# Patient Record
Sex: Male | Born: 1945
Health system: Southern US, Community
[De-identification: ages and names within clinical notes are randomized; demographics above are authoritative.]

## PROBLEM LIST (undated history)

## (undated) DIAGNOSIS — Z8719 Personal history of other diseases of the digestive system: Secondary | ICD-10-CM

## (undated) DIAGNOSIS — Q078 Other specified congenital malformations of nervous system: Secondary | ICD-10-CM

## (undated) DIAGNOSIS — R51 Headache: Principal | ICD-10-CM

## (undated) DIAGNOSIS — C801 Malignant (primary) neoplasm, unspecified: Secondary | ICD-10-CM

## (undated) DIAGNOSIS — M1990 Unspecified osteoarthritis, unspecified site: Secondary | ICD-10-CM

## (undated) DIAGNOSIS — I4819 Other persistent atrial fibrillation: Secondary | ICD-10-CM

## (undated) DIAGNOSIS — H409 Unspecified glaucoma: Secondary | ICD-10-CM

## (undated) HISTORY — PX: EYE SURGERY: SHX253

## (undated) HISTORY — DX: Malignant (primary) neoplasm, unspecified: C80.1

## (undated) HISTORY — DX: Headache: R51

## (undated) HISTORY — DX: Unspecified glaucoma: H40.9

## (undated) HISTORY — PX: TONSILLECTOMY: SUR1361

## (undated) HISTORY — PX: HERNIA REPAIR: SHX51

## (undated) HISTORY — DX: Personal history of other diseases of the digestive system: Z87.19

## (undated) HISTORY — DX: Other specified congenital malformations of nervous system: Q07.8

---

## 2002-02-03 ENCOUNTER — Ambulatory Visit (HOSPITAL_COMMUNITY): Admission: RE | Admit: 2002-02-03 | Discharge: 2002-02-03 | Payer: Self-pay | Admitting: Gastroenterology

## 2012-06-28 ENCOUNTER — Telehealth: Payer: Self-pay

## 2012-06-28 ENCOUNTER — Ambulatory Visit (INDEPENDENT_AMBULATORY_CARE_PROVIDER_SITE_OTHER): Payer: Medicare Other | Admitting: Family Medicine

## 2012-06-28 VITALS — BP 134/78 | HR 75 | Temp 98.6°F | Resp 16 | Ht 73.18 in | Wt 240.4 lb

## 2012-06-28 DIAGNOSIS — M549 Dorsalgia, unspecified: Secondary | ICD-10-CM

## 2012-06-28 DIAGNOSIS — S39012A Strain of muscle, fascia and tendon of lower back, initial encounter: Secondary | ICD-10-CM

## 2012-06-28 DIAGNOSIS — IMO0002 Reserved for concepts with insufficient information to code with codable children: Secondary | ICD-10-CM

## 2012-06-28 DIAGNOSIS — R3129 Other microscopic hematuria: Secondary | ICD-10-CM

## 2012-06-28 LAB — POCT URINALYSIS DIPSTICK
Bilirubin, UA: NEGATIVE
Glucose, UA: NEGATIVE
Leukocytes, UA: NEGATIVE
Nitrite, UA: NEGATIVE
Protein, UA: NEGATIVE
Spec Grav, UA: >=1.03
Urobilinogen, UA: 0.2
pH, UA: 5

## 2012-06-28 LAB — POCT UA - MICROSCOPIC ONLY
Casts, Ur, LPF, POC: NEGATIVE
Crystals, Ur, HPF, POC: NEGATIVE
Epithelial cells, urine per micros: NEGATIVE
Mucus, UA: NEGATIVE
Yeast, UA: NEGATIVE

## 2012-06-28 MED ORDER — METAXALONE 800 MG PO TABS: 800.0000 mg | ORAL_TABLET | Freq: Three times a day (TID) | ORAL | Status: DC

## 2012-06-28 MED ORDER — NABUMETONE 750 MG PO TABS: 750.0000 mg | ORAL_TABLET | Freq: Two times a day (BID) | ORAL | Status: DC

## 2012-06-28 NOTE — Telephone Encounter (Signed)
Muscle relaxant Dr Alwyn Ren rx'd is not covered and would cost $120.00 and patient would like to know if there is a substitute he can rx.  [id number] - [place], [place] - [address]

## 2012-06-28 NOTE — Telephone Encounter (Signed)
LMOM for pt to CB.  

## 2012-06-28 NOTE — Progress Notes (Signed)
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 He was well hunting and stepped wrong and strain his back. It's been a couple of weeks ago. It's now moved over to the other side. He wonders whether his kidneys related since he has a history of a kidney stone in the past. Has a history of having a stiff back. He says everything is stiff. He has not received any treatment for this. He does not work out, but in hunting season he goes hunting constantly.  Objective: Not on a medicines except medicines for his glaucoma. Alert and oriented. Mild tenderness in the paraspinous muscles bilaterally. No real CVA tenderness however. His abdomen soft without masses tenderness. Straight leg test is negative though he is very high hamstrings. His flexion and extension are poor, as is the lateral flexion. Abdomen soft without mass or tenderness.  Assessment: Mid and low back pain, probably muscular  Plan: Urinalysis  Results for orders placed in visit on 06/28/12  POCT URINALYSIS DIPSTICK      Component Value Range   Color, UA yellow     Clarity, UA clear     Glucose, UA neg     Bilirubin, UA neg     Ketones, UA trace     Spec Grav, UA >=1.030     Blood, UA small     pH, UA 5.0     Protein, UA neg     Urobilinogen, UA 0.2     Nitrite, UA neg     Leukocytes, UA Negative    POCT UA - MICROSCOPIC ONLY      Component Value Range   WBC, Ur, HPF, POC 0-1     RBC, urine, microscopic 6-8     Bacteria, U Microscopic 1+     Mucus, UA neg     Epithelial cells, urine per micros neg     Crystals, Ur, HPF, POC neg     Casts, Ur, LPF, POC neg     Yeast, UA neg     Will repeat urinalysis in 2 weeks. If still has RBCs we will make referral to Dr. Willow Ora

## 2012-06-28 NOTE — Patient Instructions (Addendum)
Take muscle relaxant and anti-inflammatory medication as ordered  Return in 2 weeks for repeat urinalysis. If it remains positive we will need to refer you to a urologist.

## 2012-06-30 NOTE — Telephone Encounter (Signed)
Call in: Robaxin (generic) 750 mg #40 1bid and 2 hs prn muscle relaxant. NR

## 2012-07-01 MED ORDER — METHOCARBAMOL 750 MG PO TABS: 750.0000 mg | ORAL_TABLET | Freq: Two times a day (BID) | ORAL | Status: DC

## 2012-07-01 NOTE — Telephone Encounter (Signed)
Sent in for him,

## 2012-07-11 ENCOUNTER — Other Ambulatory Visit (INDEPENDENT_AMBULATORY_CARE_PROVIDER_SITE_OTHER): Payer: Medicare Other | Admitting: Family Medicine

## 2012-07-11 DIAGNOSIS — R3129 Other microscopic hematuria: Secondary | ICD-10-CM

## 2012-07-11 LAB — POCT UA - MICROSCOPIC ONLY
Bacteria, U Microscopic: NEGATIVE
Casts, Ur, LPF, POC: NEGATIVE
Crystals, Ur, HPF, POC: NEGATIVE
Mucus, UA: NEGATIVE
RBC, urine, microscopic: NEGATIVE
Yeast, UA: NEGATIVE

## 2012-07-11 LAB — POCT URINALYSIS DIPSTICK
Bilirubin, UA: NEGATIVE
Blood, UA: NEGATIVE
Glucose, UA: NEGATIVE
Ketones, UA: NEGATIVE
Leukocytes, UA: NEGATIVE
Nitrite, UA: NEGATIVE
Protein, UA: NEGATIVE
Spec Grav, UA: 1.02
Urobilinogen, UA: 0.2
pH, UA: 5

## 2012-07-11 NOTE — Progress Notes (Signed)
Urinalysis is normal. No blood. No further treatment needed.

## 2012-07-11 NOTE — Progress Notes (Signed)
Patient here for labs only. 

## 2012-08-21 ENCOUNTER — Other Ambulatory Visit: Payer: Self-pay | Admitting: Gastroenterology

## 2012-08-21 DIAGNOSIS — R319 Hematuria, unspecified: Secondary | ICD-10-CM

## 2012-08-28 ENCOUNTER — Ambulatory Visit
Admission: RE | Admit: 2012-08-28 | Discharge: 2012-08-28 | Disposition: A | Discharge status: Home / Self Care | Payer: Medicare Other | Source: Ambulatory Visit | Attending: Gastroenterology | Admitting: Gastroenterology

## 2012-08-28 DIAGNOSIS — R319 Hematuria, unspecified: Secondary | ICD-10-CM

## 2015-01-07 ENCOUNTER — Other Ambulatory Visit: Payer: Self-pay | Admitting: Gastroenterology

## 2015-01-08 ENCOUNTER — Other Ambulatory Visit: Payer: Self-pay | Admitting: Gastroenterology

## 2015-01-14 ENCOUNTER — Encounter (HOSPITAL_COMMUNITY): Payer: Self-pay | Admitting: *Deleted

## 2015-01-21 ENCOUNTER — Encounter (HOSPITAL_COMMUNITY): Payer: Self-pay | Admitting: *Deleted

## 2015-01-21 ENCOUNTER — Ambulatory Visit (HOSPITAL_COMMUNITY): Payer: Medicare Other | Admitting: Anesthesiology

## 2015-01-21 ENCOUNTER — Ambulatory Visit (HOSPITAL_COMMUNITY)
Admission: RE | Admit: 2015-01-21 | Discharge: 2015-01-21 | Disposition: A | Discharge status: Home / Self Care | Payer: Medicare Other | Source: Ambulatory Visit | Attending: Gastroenterology | Admitting: Gastroenterology

## 2015-01-21 ENCOUNTER — Encounter (HOSPITAL_COMMUNITY)
Admission: RE | Disposition: A | Discharge status: Home / Self Care | Payer: Self-pay | Source: Ambulatory Visit | Attending: Gastroenterology

## 2015-01-21 DIAGNOSIS — R12 Heartburn: Secondary | ICD-10-CM | POA: Diagnosis not present

## 2015-01-21 DIAGNOSIS — H409 Unspecified glaucoma: Secondary | ICD-10-CM | POA: Insufficient documentation

## 2015-01-21 DIAGNOSIS — K921 Melena: Secondary | ICD-10-CM | POA: Insufficient documentation

## 2015-01-21 DIAGNOSIS — Z86010 Personal history of colonic polyps: Secondary | ICD-10-CM | POA: Insufficient documentation

## 2015-01-21 DIAGNOSIS — K317 Polyp of stomach and duodenum: Secondary | ICD-10-CM | POA: Diagnosis not present

## 2015-01-21 DIAGNOSIS — K2950 Unspecified chronic gastritis without bleeding: Secondary | ICD-10-CM | POA: Insufficient documentation

## 2015-01-21 HISTORY — PX: COLONOSCOPY: SHX5424

## 2015-01-21 HISTORY — DX: Unspecified osteoarthritis, unspecified site: M19.90

## 2015-01-21 HISTORY — PX: ESOPHAGOGASTRODUODENOSCOPY (EGD) WITH PROPOFOL: SHX5813

## 2015-01-21 SURGERY — ESOPHAGOGASTRODUODENOSCOPY (EGD) WITH PROPOFOL
Anesthesia: Monitor Anesthesia Care

## 2015-01-21 MED ORDER — LIDOCAINE HCL (CARDIAC) 20 MG/ML IV SOLN
INTRAVENOUS | Status: AC
  Filled 2015-01-21: qty 5

## 2015-01-21 MED ORDER — PROPOFOL 500 MG/50ML IV EMUL
INTRAVENOUS | Status: DC | PRN
  Administered 2015-01-21: 50 mg via INTRAVENOUS

## 2015-01-21 MED ORDER — SODIUM CHLORIDE 0.9 % IV SOLN: INTRAVENOUS | Status: DC

## 2015-01-21 MED ORDER — LACTATED RINGERS IV SOLN
INTRAVENOUS | Status: DC
  Administered 2015-01-21: 10:00:00 via INTRAVENOUS
  Administered 2015-01-21: 1000 mL via INTRAVENOUS

## 2015-01-21 MED ORDER — PROPOFOL INFUSION 10 MG/ML OPTIME
INTRAVENOUS | Status: DC | PRN
  Administered 2015-01-21: 100 ug/kg/min via INTRAVENOUS

## 2015-01-21 MED ORDER — PROPOFOL 10 MG/ML IV BOLUS
INTRAVENOUS | Status: AC
  Filled 2015-01-21: qty 20

## 2015-01-21 SURGICAL SUPPLY — 15 items

## 2015-01-21 NOTE — Anesthesia Preprocedure Evaluation (Addendum)
Anesthesia Evaluation  Patient identified by MRN, date of birth, ID band Patient awake  General Assessment Comment:Glaucoma  Reviewed: Allergy & Precautions, NPO status , Patient's Chart, lab work & pertinent test results  Airway Mallampati: II  TM Distance: >3 FB Neck ROM: Full    Dental no notable dental hx.    Pulmonary neg pulmonary ROS,  breath sounds clear to auscultation  Pulmonary exam normal       Cardiovascular negative cardio ROS Normal cardiovascular examRhythm:Regular Rate:Normal     Neuro/Psych negative neurological ROS  negative psych ROS   GI/Hepatic negative GI ROS, Neg liver ROS,   Endo/Other  negative endocrine ROS  Renal/GU negative Renal ROS  negative genitourinary   Musculoskeletal negative musculoskeletal ROS (+)   Abdominal   Peds negative pediatric ROS (+)  Hematology negative hematology ROS (+)   Anesthesia Other Findings   Reproductive/Obstetrics negative OB ROS                             Anesthesia Physical Anesthesia Plan  ASA: II  Anesthesia Plan: MAC   Post-op Pain Management:    Induction: Intravenous  Airway Management Planned: Nasal Cannula  Additional Equipment:   Intra-op Plan:   Post-operative Plan:   Informed Consent: I have reviewed the patients History and Physical, chart, labs and discussed the procedure including the risks, benefits and alternatives for the proposed anesthesia with the patient or authorized representative who has indicated his/her understanding and acceptance.   Dental advisory given  Plan Discussed with: CRNA and Surgeon  Anesthesia Plan Comments:         Anesthesia Quick Evaluation

## 2015-01-21 NOTE — Anesthesia Postprocedure Evaluation (Signed)
  Anesthesia Post-op Note  Patient: Leonard Williams  Procedure(s) Performed: Procedure(s) (LRB): ESOPHAGOGASTRODUODENOSCOPY (EGD) WITH PROPOFOL (N/A) COLONOSCOPY (N/A)  Patient Location: PACU  Anesthesia Type: MAC  Level of Consciousness: awake and alert   Airway and Oxygen Therapy: Patient Spontanous Breathing  Post-op Pain: mild  Post-op Assessment: Post-op Vital signs reviewed, Patient's Cardiovascular Status Stable, Respiratory Function Stable, Patent Airway and No signs of Nausea or vomiting  Last Vitals:  Filed Vitals:   01/21/15 1200  BP: 124/84  Pulse: 54  Temp:   Resp: 13    Post-op Vital Signs: stable   Complications: No apparent anesthesia complications

## 2015-01-21 NOTE — Op Note (Signed)
Problems: Heartburn without dysphagia. Small-volume hematochezia. 12/05/2011 colonoscopy performed with removal of a 4 mm sessile serrated adenomatous cecal polyp.  Endoscopist: Earle Gell  Premedication: Propofol administered by anesthesia  Procedure: Diagnostic esophagogastroduodenoscopy The patient was placed in the left lateral decubitus position. The Pentax gastroscope was passed through the posterior hypopharynx into the proximal esophagus without difficulty. The hypopharynx, larynx, and vocal cords appeared normal.  Esophagoscopy: The proximal, mid, and lower segments of the esophageal mucosa appeared normal. The squamocolumnar junction was regular in appearance and noted at 45 cm from the incisor teeth. There was no endoscopic evidence for the presence of erosive esophagitis or Barrett's esophagus.  Gastroscopy: Retroflex view of the gastric cardia was normal. In the gastric fundus was a 1 cm subepithelial polypoid lesion with normal overlying gastric mucosa. Palpation of the lesion with the closed biopsy forceps revealed a patient which was not suggestive of a lipoma. Multiple biopsies were performed. There were a view scattered benign fundic gland appearing polyps in the gastric body ranging in size from 3 mm-5 mm. Biopsies were performed to confirm my suspicion  That  these polyps were benign fundic gland polyps. The gastric antrum and pylorus appeared normal.  Duodenoscopy: The duodenal bulb and descending duodenum appeared normal.  Assessment: There was a 1 cm subepithelial polypoid lesion in the gastric fundus which was biopsied. I suspect this is a gastrointestinal stromal tumor. Scattered small benign fundic gland polyps were biopsied. Otherwise normal esophagogastroduodenoscopy  Recommendation: Depending on the biopsy findings, the patient may require endoscopic ultrasound of the gastric fundus subepithelial polypoid lesions  Procedure: Surveillance colonoscopy Anal inspection  and digital rectal exam were normal. The Pentax pediatric colonoscope was introduced into the rectum and advanced to the cecum. A normal-appearing appendiceal orifice and ileocecal valve were identified. Colonic preparation for the exam today was good. Withdrawal time was 14 minutes  Rectum. Normal. Retroflexed view of the distal rectum was normal  Sigmoid colon and descending colon. Left colonic diverticulosis  Splenic flexure. Normal  Transverse colon. Normal  Hepatic flexure. Normal  Ascending colon. Normal  Cecum and ileocecal valve. Normal  Assessment: Normal surveillance colonoscopy  Recommendation: Schedule surveillance colonoscopy in 5 years

## 2015-01-21 NOTE — Transfer of Care (Signed)
Immediate Anesthesia Transfer of Care Note  Patient: Leonard Williams  Procedure(s) Performed: Procedure(s): ESOPHAGOGASTRODUODENOSCOPY (EGD) WITH PROPOFOL (N/A) COLONOSCOPY (N/A)  Patient Location: PACU and Endoscopy Unit  Anesthesia Type:MAC  Level of Consciousness: awake, alert , oriented and patient cooperative  Airway & Oxygen Therapy: Patient Spontanous Breathing and Patient connected to nasal cannula oxygen  Post-op Assessment: Report given to RN and Post -op Vital signs reviewed and stable  Post vital signs: Reviewed and stable  Last Vitals:  Filed Vitals:   01/21/15 1021  BP: 141/71  Temp: 36.7 C  Resp: 12    Complications: No apparent anesthesia complications

## 2015-01-21 NOTE — H&P (Signed)
  Problems: Heartburn. Small-volume hematochezia. 12/05/2011 colonoscopy performed with removal of a 4 mm serrated adenomatous cecal polyp.  History: The patient is a 69 year old male born April 15, 1946. He has had intermittent, small-volume hematochezia. In May 2013, he underwent a colonoscopy with removal of a 4 mm sessile serrated adenomatous cecal polyp. He has gained approximately 25 pounds in weight and is experiencing intermittent heartburn without dysphagia or odynophagia.  Scheduled to undergo a colonoscopy and esophagogastroduodenoscopy.  Medication allergies: None  Past medical history: August 2013 normal brain MRI. Glaucoma. Small sessile serrated adenomatous cecal polyp removed in May 2013. Tonsillectomy. Left inguinal herniorrhaphy with mesh.  Exam: The patient is alert and lying comfortably on the endoscopy stretcher. Abdomen is soft and nontender to palpation. Lungs are clear to auscultation. Cardiac exam reveals a regular rhythm.  Plan: Proceed with esophagogastroduodenoscopy and colonoscopy

## 2015-01-22 ENCOUNTER — Encounter (HOSPITAL_COMMUNITY): Payer: Self-pay | Admitting: Gastroenterology

## 2015-01-25 ENCOUNTER — Telehealth: Payer: Self-pay | Admitting: Gastroenterology

## 2015-01-25 DIAGNOSIS — K3189 Other diseases of stomach and duodenum: Secondary | ICD-10-CM

## 2015-01-25 NOTE — Telephone Encounter (Signed)
Leonard Williams, He was referred by Dr. Earle Gell for gastric mass.  He needs upper EUS, radial +/- linear, + MAC, next available EUS Thursday.  Thanks

## 2015-01-26 ENCOUNTER — Other Ambulatory Visit: Payer: Self-pay

## 2015-01-26 DIAGNOSIS — K3189 Other diseases of stomach and duodenum: Secondary | ICD-10-CM

## 2015-01-26 NOTE — Telephone Encounter (Signed)
EUS scheduled, pt instructed and medications reviewed.  Patient instructions mailed to home.  Patient to call with any questions or concerns.  

## 2015-01-26 NOTE — Telephone Encounter (Signed)
Left message on machine to call back  

## 2015-02-01 ENCOUNTER — Encounter (HOSPITAL_COMMUNITY): Payer: Self-pay | Admitting: *Deleted

## 2015-02-04 ENCOUNTER — Ambulatory Visit (HOSPITAL_COMMUNITY): Payer: Medicare Other | Admitting: Anesthesiology

## 2015-02-04 ENCOUNTER — Encounter (HOSPITAL_COMMUNITY): Payer: Self-pay

## 2015-02-04 ENCOUNTER — Ambulatory Visit (HOSPITAL_COMMUNITY)
Admission: RE | Admit: 2015-02-04 | Discharge: 2015-02-04 | Disposition: A | Discharge status: Home / Self Care | Payer: Medicare Other | Source: Ambulatory Visit | Attending: Gastroenterology | Admitting: Gastroenterology

## 2015-02-04 ENCOUNTER — Encounter (HOSPITAL_COMMUNITY)
Admission: RE | Disposition: A | Discharge status: Home / Self Care | Payer: Self-pay | Source: Ambulatory Visit | Attending: Gastroenterology

## 2015-02-04 DIAGNOSIS — M1990 Unspecified osteoarthritis, unspecified site: Secondary | ICD-10-CM | POA: Insufficient documentation

## 2015-02-04 DIAGNOSIS — Z86010 Personal history of colonic polyps: Secondary | ICD-10-CM | POA: Diagnosis not present

## 2015-02-04 DIAGNOSIS — H409 Unspecified glaucoma: Secondary | ICD-10-CM | POA: Insufficient documentation

## 2015-02-04 DIAGNOSIS — K319 Disease of stomach and duodenum, unspecified: Secondary | ICD-10-CM | POA: Diagnosis not present

## 2015-02-04 DIAGNOSIS — K3189 Other diseases of stomach and duodenum: Secondary | ICD-10-CM | POA: Insufficient documentation

## 2015-02-04 HISTORY — PX: EUS: SHX5427

## 2015-02-04 SURGERY — UPPER ENDOSCOPIC ULTRASOUND (EUS) LINEAR
Anesthesia: Monitor Anesthesia Care

## 2015-02-04 MED ORDER — PROPOFOL 10 MG/ML IV BOLUS
INTRAVENOUS | Status: DC | PRN
  Administered 2015-02-04 (×4): 40 mg via INTRAVENOUS

## 2015-02-04 MED ORDER — BUTAMBEN-TETRACAINE-BENZOCAINE 2-2-14 % EX AERO
INHALATION_SPRAY | CUTANEOUS | Status: DC | PRN
  Administered 2015-02-04: 2 via TOPICAL

## 2015-02-04 MED ORDER — KETAMINE HCL 10 MG/ML IJ SOLN
INTRAMUSCULAR | Status: DC | PRN
  Administered 2015-02-04: 20 mg via INTRAVENOUS

## 2015-02-04 MED ORDER — PROPOFOL 10 MG/ML IV BOLUS
INTRAVENOUS | Status: AC
  Filled 2015-02-04: qty 20

## 2015-02-04 MED ORDER — SODIUM CHLORIDE 0.9 % IV SOLN: INTRAVENOUS | Status: DC

## 2015-02-04 MED ORDER — LACTATED RINGERS IV SOLN
INTRAVENOUS | Status: DC | PRN
  Administered 2015-02-04: 10:00:00 via INTRAVENOUS

## 2015-02-04 NOTE — Interval H&P Note (Signed)
History and Physical Interval Note:  02/04/2015 9:48 AM  Leonard Williams  has presented today for surgery, with the diagnosis of gastric mass  The various methods of treatment have been discussed with the patient and family. After consideration of risks, benefits and other options for treatment, the patient has consented to  Procedure(s): UPPER ENDOSCOPIC ULTRASOUND (EUS) LINEAR (N/A) as a surgical intervention .  The patient's history has been reviewed, patient examined, no change in status, stable for surgery.  I have reviewed the patient's chart and labs.  Questions were answered to the patient's satisfaction.     Milus Banister

## 2015-02-04 NOTE — Op Note (Signed)
[facility] [place], [address]   ENDOSCOPIC ULTRASOUND PROCEDURE REPORT  PATIENT: Leonard Williams, Leonard Williams  MR#: [id number] BIRTHDATE: [birth year]  GENDER: male ENDOSCOPIST: Milus Banister, MD REFERRED BY:  Earle Gell, M.D. PROCEDURE DATE:  02/04/2015 PROCEDURE:   Upper EUS ASA CLASS:      Class II INDICATIONS:   1.  incidentally noted subepithelial leions in gastric fundus (Dr.  Wynetta Emery EGD 2 weeks ago).  Mucosal biopsies essentially normal.. MEDICATIONS: Monitored anesthesia care  DESCRIPTION OF PROCEDURE:   After the risks benefits and alternatives of the procedure were  explained, informed consent was obtained. The patient was then placed in the left, lateral, decubitus postion and IV sedation was administered. Throughout the procedure, the patients blood pressure, pulse and oxygen saturations were monitored continuously.  Under direct visualization, the PENTAX EUS SCOPE  endoscope was introduced through the mouth  and advanced to the second portion of the duodenum .  Water was used as necessary to provide an acoustic interface.  Upon completion of the imaging, water was removed and the patient was sent to the recovery room in satisfactory condition.  Endoscopic findings: 1.  The previously noted proximal stomach subepithelial lesion was clearly identified. The overlying mucosa appeared normal. Endoscopically it looks to be about 1 cm across.  EUS findings: 1.  The lesion noted above was hypoechoic, round, discretely bordered, clearly communicated with the muscularis propria layer of the gastric wall. This measured 11.2 mm maximally. 2.  There were no perigastric lymph nodes 3.  The gastric wall was otherwise completely normal 4.  The pancreas, gallbladder, limited views of the left lobe of the liver were all normal.  IMPRESSION/RECOMMENDATION 11.43mm proximal stomach subepithelial lesion.  By EUS this is a GIST lesion and at it's size  (<2cm) there is no need for surgical resection.  Will plan to repeat EUS to check for interval growth in 1 year.   _______________________________ eSigned:  Milus Banister, MD 02/04/2015 10:22 AM

## 2015-02-04 NOTE — Transfer of Care (Signed)
Immediate Anesthesia Transfer of Care Note  Patient: Leonard Williams  Procedure(s) Performed: Procedure(s): UPPER ENDOSCOPIC ULTRASOUND (EUS) LINEAR (N/A)  Patient Location: PACU and Endoscopy Unit  Anesthesia Type:MAC  Level of Consciousness: awake, oriented, patient cooperative, lethargic and responds to stimulation  Airway & Oxygen Therapy: Patient Spontanous Breathing and Patient connected to nasal cannula oxygen  Post-op Assessment: Report given to RN, Post -op Vital signs reviewed and stable and Patient moving all extremities  Post vital signs: Reviewed and stable  Last Vitals:  Filed Vitals:   02/04/15 0920  BP: 146/70  Pulse: 63  Temp: 36.6 C  Resp: 10    Complications: No apparent anesthesia complications

## 2015-02-04 NOTE — Anesthesia Postprocedure Evaluation (Signed)
  Anesthesia Post-op Note  Patient: Leonard Williams  Procedure(s) Performed: Procedure(s) (LRB): UPPER ENDOSCOPIC ULTRASOUND (EUS) LINEAR (N/A)  Patient Location: PACU  Anesthesia Type: MAC  Level of Consciousness: awake and alert   Airway and Oxygen Therapy: Patient Spontanous Breathing  Post-op Pain: mild  Post-op Assessment: Post-op Vital signs reviewed, Patient's Cardiovascular Status Stable, Respiratory Function Stable, Patent Airway and No signs of Nausea or vomiting  Last Vitals:  Filed Vitals:   02/04/15 1016  BP: 118/74  Pulse:   Temp: 36.7 C  Resp: 11    Post-op Vital Signs: stable   Complications: No apparent anesthesia complications

## 2015-02-04 NOTE — Anesthesia Preprocedure Evaluation (Signed)
Anesthesia Evaluation  Patient identified by MRN, date of birth, ID band Patient awake    Reviewed: Allergy & Precautions, NPO status , Patient's Chart, lab work & pertinent test results  Airway Mallampati: II  TM Distance: >3 FB Neck ROM: Full    Dental no notable dental hx.    Pulmonary neg pulmonary ROS,  breath sounds clear to auscultation  Pulmonary exam normal       Cardiovascular negative cardio ROS Normal cardiovascular examRhythm:Regular Rate:Normal     Neuro/Psych negative neurological ROS  negative psych ROS   GI/Hepatic negative GI ROS, Neg liver ROS,   Endo/Other  negative endocrine ROS  Renal/GU negative Renal ROS  negative genitourinary   Musculoskeletal  (+) Arthritis -,   Abdominal   Peds negative pediatric ROS (+)  Hematology negative hematology ROS (+)   Anesthesia Other Findings   Reproductive/Obstetrics negative OB ROS                             Anesthesia Physical Anesthesia Plan  ASA: II  Anesthesia Plan: MAC   Post-op Pain Management:    Induction: Intravenous  Airway Management Planned: Natural Airway  Additional Equipment:   Intra-op Plan:   Post-operative Plan:   Informed Consent: I have reviewed the patients History and Physical, chart, labs and discussed the procedure including the risks, benefits and alternatives for the proposed anesthesia with the patient or authorized representative who has indicated his/her understanding and acceptance.   Dental advisory given  Plan Discussed with: CRNA  Anesthesia Plan Comments:         Anesthesia Quick Evaluation

## 2015-02-04 NOTE — Discharge Instructions (Signed)
Conscious Sedation, Adult, Care After °Refer to this sheet in the next few weeks. These instructions provide you with information on caring for yourself after your procedure. Your health care provider may also give you more specific instructions. Your treatment has been planned according to current medical practices, but problems sometimes occur. Call your health care provider if you have any problems or questions after your procedure. °WHAT TO EXPECT AFTER THE PROCEDURE  °After your procedure: °· You may feel sleepy, clumsy, and have poor balance for several hours. °· Vomiting may occur if you eat too soon after the procedure. °HOME CARE INSTRUCTIONS °· Do not participate in any activities where you could become injured for at least 24 hours. Do not: °¨ Drive. °¨ Swim. °¨ Ride a bicycle. °¨ Operate heavy machinery. °¨ Cook. °¨ Use power tools. °¨ Climb ladders. °¨ Work from a high place. °· Do not make important decisions or sign legal documents until you are improved. °· If you vomit, drink water, juice, or soup when you can drink without vomiting. Make sure you have little or no nausea before eating solid foods. °· Only take over-the-counter or prescription medicines for pain, discomfort, or fever as directed by your health care provider. °· Make sure you and your family fully understand everything about the medicines given to you, including what side effects may occur. °· You should not drink alcohol, take sleeping pills, or take medicines that cause drowsiness for at least 24 hours. °· If you smoke, do not smoke without supervision. °· If you are feeling better, you may resume normal activities 24 hours after you were sedated. °· Keep all appointments with your health care provider. °SEEK MEDICAL CARE IF: °· Your skin is pale or bluish in color. °· You continue to feel nauseous or vomit. °· Your pain is getting worse and is not helped by medicine. °· You have bleeding or swelling. °· You are still sleepy or  feeling clumsy after 24 hours. °SEEK IMMEDIATE MEDICAL CARE IF: °· You develop a rash. °· You have difficulty breathing. °· You develop any type of allergic problem. °· You have a fever. °MAKE SURE YOU: °· Understand these instructions. °· Will watch your condition. °· Will get help right away if you are not doing well or get worse. °Document Released: 04/16/2013 Document Reviewed: 04/16/2013 °[employer]® Patient Information ©2015 [employer]. This information is not intended to replace advice given to you by your health care provider. Make sure you discuss any questions you have with your health care provider. °  °

## 2015-02-04 NOTE — H&P (View-Only) (Signed)
  Problems: Heartburn. Small-volume hematochezia. 12/05/2011 colonoscopy performed with removal of a 4 mm serrated adenomatous cecal polyp.  History: The patient is a 69 year old male born 10-19-1945. He has had intermittent, small-volume hematochezia. In May 2013, he underwent a colonoscopy with removal of a 4 mm sessile serrated adenomatous cecal polyp. He has gained approximately 25 pounds in weight and is experiencing intermittent heartburn without dysphagia or odynophagia.  Scheduled to undergo a colonoscopy and esophagogastroduodenoscopy.  Medication allergies: None  Past medical history: August 2013 normal brain MRI. Glaucoma. Small sessile serrated adenomatous cecal polyp removed in May 2013. Tonsillectomy. Left inguinal herniorrhaphy with mesh.  Exam: The patient is alert and lying comfortably on the endoscopy stretcher. Abdomen is soft and nontender to palpation. Lungs are clear to auscultation. Cardiac exam reveals a regular rhythm.  Plan: Proceed with esophagogastroduodenoscopy and colonoscopy

## 2015-02-05 ENCOUNTER — Encounter (HOSPITAL_COMMUNITY): Payer: Self-pay | Admitting: Gastroenterology

## 2015-03-01 ENCOUNTER — Encounter: Payer: Self-pay | Admitting: Neurology

## 2015-03-04 NOTE — Telephone Encounter (Signed)
I spoke to [facility] at [facility]. She faxed the report of the patient's MRI from 2013. Report placed on Dr. Jannifer Franklin' desk.

## 2015-03-08 ENCOUNTER — Encounter: Payer: Self-pay | Admitting: Neurology

## 2015-03-08 ENCOUNTER — Ambulatory Visit (INDEPENDENT_AMBULATORY_CARE_PROVIDER_SITE_OTHER): Payer: Medicare Other | Admitting: Neurology

## 2015-03-08 VITALS — BP 131/80 | HR 65 | Ht 74.0 in | Wt 244.5 lb

## 2015-03-08 DIAGNOSIS — R51 Headache: Secondary | ICD-10-CM | POA: Diagnosis not present

## 2015-03-08 DIAGNOSIS — R519 Headache, unspecified: Secondary | ICD-10-CM

## 2015-03-08 HISTORY — DX: Headache, unspecified: R51.9

## 2015-03-08 MED ORDER — TOPIRAMATE 25 MG PO TABS: ORAL_TABLET | ORAL | Status: DC

## 2015-03-08 NOTE — Patient Instructions (Addendum)
We will check MRI of the brain and get blood work. We will start Topamax for the headache.  Topamax (topiramate) is a seizure medication that has an FDA approval for seizures and for migraine headache. Potential side effects of this medication include weight loss, cognitive slowing, tingling in the fingers and toes, and carbonated drinks will taste bad. If any significant side effects are noted on this drug, please contact our office.  Headaches, Frequently Asked Questions MIGRAINE HEADACHES Q: What is migraine? What causes it? How can I treat it? A: Generally, migraine headaches begin as a dull ache. Then they develop into a constant, throbbing, and pulsating pain. You may experience pain at the temples. You may experience pain at the front or back of one or both sides of the head. The pain is usually accompanied by a combination of:  Nausea.  Vomiting.  Sensitivity to light and noise. Some people (about 15%) experience an aura (see below) before an attack. The cause of migraine is believed to be chemical reactions in the brain. Treatment for migraine may include over-the-counter or prescription medications. It may also include self-help techniques. These include relaxation training and biofeedback.  Q: What is an aura? A: About 15% of people with migraine get an "aura". This is a sign of neurological symptoms that occur before a migraine headache. You may see wavy or jagged lines, dots, or flashing lights. You might experience tunnel vision or blind spots in one or both eyes. The aura can include visual or auditory hallucinations (something imagined). It may include disruptions in smell (such as strange odors), taste or touch. Other symptoms include:  Numbness.  A "pins and needles" sensation.  Difficulty in recalling or speaking the correct word. These neurological events may last as long as 60 minutes. These symptoms will fade as the headache begins. Q: What is a trigger? A: Certain  physical or environmental factors can lead to or "trigger" a migraine. These include:  Foods.  Hormonal changes.  Weather.  Stress. It is important to remember that triggers are different for everyone. To help prevent migraine attacks, you need to figure out which triggers affect you. Keep a headache diary. This is a good way to track triggers. The diary will help you talk to your healthcare professional about your condition. Q: Does weather affect migraines? A: Bright sunshine, hot, humid conditions, and drastic changes in barometric pressure may lead to, or "trigger," a migraine attack in some people. But studies have shown that weather does not act as a trigger for everyone with migraines. Q: What is the link between migraine and hormones? A: Hormones start and regulate many of your body's functions. Hormones keep your body in balance within a constantly changing environment. The levels of hormones in your body are unbalanced at times. Examples are during menstruation, pregnancy, or menopause. That can lead to a migraine attack. In fact, about three quarters of all women with migraine report that their attacks are related to the menstrual cycle.  Q: Is there an increased risk of stroke for migraine sufferers? A: The likelihood of a migraine attack causing a stroke is very remote. That is not to say that migraine sufferers cannot have a stroke associated with their migraines. In persons under age 54, the most common associated factor for stroke is migraine headache. But over the course of a person's normal life span, the occurrence of migraine headache may actually be associated with a reduced risk of dying from cerebrovascular disease due to  stroke.  Q: What are acute medications for migraine? A: Acute medications are used to treat the pain of the headache after it has started. Examples over-the-counter medications, NSAIDs, ergots, and triptans.  Q: What are the triptans? A: Triptans are the  newest class of abortive medications. They are specifically targeted to treat migraine. Triptans are vasoconstrictors. They moderate some chemical reactions in the brain. The triptans work on receptors in your brain. Triptans help to restore the balance of a neurotransmitter called serotonin. Fluctuations in levels of serotonin are thought to be a main cause of migraine.  Q: Are over-the-counter medications for migraine effective? A: Over-the-counter, or "OTC," medications may be effective in relieving mild to moderate pain and associated symptoms of migraine. But you should see your caregiver before beginning any treatment regimen for migraine.  Q: What are preventive medications for migraine? A: Preventive medications for migraine are sometimes referred to as "prophylactic" treatments. They are used to reduce the frequency, severity, and length of migraine attacks. Examples of preventive medications include antiepileptic medications, antidepressants, beta-blockers, calcium channel blockers, and NSAIDs (nonsteroidal anti-inflammatory drugs). Q: Why are anticonvulsants used to treat migraine? A: During the past few years, there has been an increased interest in antiepileptic drugs for the prevention of migraine. They are sometimes referred to as "anticonvulsants". Both epilepsy and migraine may be caused by similar reactions in the brain.  Q: Why are antidepressants used to treat migraine? A: Antidepressants are typically used to treat people with depression. They may reduce migraine frequency by regulating chemical levels, such as serotonin, in the brain.  Q: What alternative therapies are used to treat migraine? A: The term "alternative therapies" is often used to describe treatments considered outside the scope of conventional Western medicine. Examples of alternative therapy include acupuncture, acupressure, and yoga. Another common alternative treatment is herbal therapy. Some herbs are believed to  relieve headache pain. Always discuss alternative therapies with your caregiver before proceeding. Some herbal products contain arsenic and other toxins. TENSION HEADACHES Q: What is a tension-type headache? What causes it? How can I treat it? A: Tension-type headaches occur randomly. They are often the result of temporary stress, anxiety, fatigue, or anger. Symptoms include soreness in your temples, a tightening band-like sensation around your head (a "vice-like" ache). Symptoms can also include a pulling feeling, pressure sensations, and contracting head and neck muscles. The headache begins in your forehead, temples, or the back of your head and neck. Treatment for tension-type headache may include over-the-counter or prescription medications. Treatment may also include self-help techniques such as relaxation training and biofeedback. CLUSTER HEADACHES Q: What is a cluster headache? What causes it? How can I treat it? A: Cluster headache gets its name because the attacks come in groups. The pain arrives with little, if any, warning. It is usually on one side of the head. A tearing or bloodshot eye and a runny nose on the same side of the headache may also accompany the pain. Cluster headaches are believed to be caused by chemical reactions in the brain. They have been described as the most severe and intense of any headache type. Treatment for cluster headache includes prescription medication and oxygen. SINUS HEADACHES Q: What is a sinus headache? What causes it? How can I treat it? A: When a cavity in the bones of the face and skull (a sinus) becomes inflamed, the inflammation will cause localized pain. This condition is usually the result of an allergic reaction, a tumor, or an infection. If your  headache is caused by a sinus blockage, such as an infection, you will probably have a fever. An x-ray will confirm a sinus blockage. Your caregiver's treatment might include antibiotics for the infection, as  well as antihistamines or decongestants.  REBOUND HEADACHES Q: What is a rebound headache? What causes it? How can I treat it? A: A pattern of taking acute headache medications too often can lead to a condition known as "rebound headache." A pattern of taking too much headache medication includes taking it more than 2 days per week or in excessive amounts. That means more than the label or a caregiver advises. With rebound headaches, your medications not only stop relieving pain, they actually begin to cause headaches. Doctors treat rebound headache by tapering the medication that is being overused. Sometimes your caregiver will gradually substitute a different type of treatment or medication. Stopping may be a challenge. Regularly overusing a medication increases the potential for serious side effects. Consult a caregiver if you regularly use headache medications more than 2 days per week or more than the label advises. ADDITIONAL QUESTIONS AND ANSWERS Q: What is biofeedback? A: Biofeedback is a self-help treatment. Biofeedback uses special equipment to monitor your body's involuntary physical responses. Biofeedback monitors:  Breathing.  Pulse.  Heart rate.  Temperature.  Muscle tension.  Brain activity. Biofeedback helps you refine and perfect your relaxation exercises. You learn to control the physical responses that are related to stress. Once the technique has been mastered, you do not need the equipment any more. Q: Are headaches hereditary? A: Four out of five (80%) of people that suffer report a family history of migraine. Scientists are not sure if this is genetic or a family predisposition. Despite the uncertainty, a child has a 50% chance of having migraine if one parent suffers. The child has a 75% chance if both parents suffer.  Q: Can children get headaches? A: By the time they reach high school, most young people have experienced some type of headache. Many safe and effective  approaches or medications can prevent a headache from occurring or stop it after it has begun.  Q: What type of doctor should I see to diagnose and treat my headache? A: Start with your primary caregiver. Discuss his or her experience and approach to headaches. Discuss methods of classification, diagnosis, and treatment. Your caregiver may decide to recommend you to a headache specialist, depending upon your symptoms or other physical conditions. Having diabetes, allergies, etc., may require a more comprehensive and inclusive approach to your headache. The National Headache Foundation will provide, upon request, a list of [facility] physician members in your state. Document Released: 09/16/2003 Document Revised: 09/18/2011 Document Reviewed: 02/24/2008 [facility] Patient Information 2015 [place], [place]. This information is not intended to replace advice given to you by your health care provider. Make sure you discuss any questions you have with your health care provider.

## 2015-03-08 NOTE — Progress Notes (Signed)
Reason for visit: Headache  Referring physician: Dr. Earle Gell  Leonard Williams is a 69 y.o. male  History of present illness:  Leonard Williams is a 69 year old right-handed white male with a history of headaches that became daily about 10 weeks prior to this evaluation. The patient was having occasional headaches prior to this, on average once a week. The headaches may be associated with visual scotoma and flashing lights. The headaches 10 weeks ago began with these visual phenomenon, but this has not recurred. The headache has been daily in nature, primarily bifrontal, with some discomfort in the neck is well. The patient denies any fevers or chills, he does report some exposure to ticks over the summer. He has been taking Aleve and ibuprofen which usually helps his headaches, but this is currently ineffective. He denies any nausea or vomiting. He denies any numbness or weakness of extremities. He does have a history of significant glaucoma, he has significant visual loss in the left eye associated with this. He is followed by Dr. Katy Fitch for this. The patient indicates that the severity of the headache may vary from one day to the next. He has had caffeine withdrawal headaches in the past, but not currently. He has been under a lot of stress recently. He is sent to this office for an evaluation.  Past Medical History  Diagnosis Date  . Glaucoma   . Arthritis     mild arthritis- neck shoulders  . History of colonic diverticulitis     left  . Glaucoma   . Glaucoma   . Beverely Low Gunn pupil   . Cancer     GI tumor  . Headache disorder 03/08/2015    Past Surgical History  Procedure Laterality Date  . Eye surgery      laser surgery for glaucoma  . Tonsillectomy    . Hernia repair Left     groin  . Esophagogastroduodenoscopy (egd) with propofol N/A 01/21/2015    Procedure: ESOPHAGOGASTRODUODENOSCOPY (EGD) WITH PROPOFOL;  Surgeon: Garlan Fair, MD;  Location: [facility];  Service:  Endoscopy;  Laterality: N/A;  . Colonoscopy N/A 01/21/2015    Procedure: COLONOSCOPY;  Surgeon: Garlan Fair, MD;  Location: [facility];  Service: Endoscopy;  Laterality: N/A;  . Eus N/A 02/04/2015    Procedure: UPPER ENDOSCOPIC ULTRASOUND (EUS) LINEAR;  Surgeon: Milus Banister, MD;  Location: [facility];  Service: Endoscopy;  Laterality: N/A;    Family History  Problem Relation Age of Onset  . Glaucoma Mother   . Dementia Father   . Graves' disease Sister   . Heart disease Sister   . Migraines Sister   . Heart attack Brother   . CAD Brother     Social history:  reports that he has never smoked. He has never used smokeless tobacco. He reports that he drinks about 0.6 - 1.2 oz of alcohol per week. He reports that he does not use illicit drugs.  Medications:  Prior to Admission medications   Medication Sig Start Date End Date Taking? Authorizing Provider  DORZOLAMIDE HCL-TIMOLOL MAL OP Place 1 drop into both eyes 2 (two) times daily.   Yes Historical Provider, MD  latanoprost (XALATAN) 0.005 % ophthalmic solution Place 1 drop into both eyes at bedtime.    Yes Historical Provider, MD  Multiple Vitamin (MULTIVITAMIN WITH MINERALS) TABS tablet Take 1 tablet by mouth every evening.    Yes Historical Provider, MD  Multiple Vitamins-Minerals (CENTRUM SILVER PO) Take 1 tablet  by mouth daily.   Yes Historical Provider, MD  Naproxen Sodium (ALEVE PO) Take by mouth as needed.   Yes Historical Provider, MD     No Known Allergies  ROS:  Out of a complete 14 system review of symptoms, the patient complains only of the following symptoms, and all other reviewed systems are negative.  Ringing in the ears Eye pain Joint discomfort Headache  Blood pressure 131/80, pulse 65, height 6\' 2"  (1.88 m), weight 244 lb 8 oz (110.904 kg).  Physical Exam  General: The patient is alert and cooperative at the time of the examination.  Eyes: Pupils are equal, round, and reactive to light. Discs  are flat bilaterally.  Neck: The neck is supple, no carotid bruits are noted.  Respiratory: The respiratory examination is clear.  Cardiovascular: The cardiovascular examination reveals a regular rate and rhythm, no obvious murmurs or rubs are noted.   Neuromuscular: Range of movement of the cervical spine is relatively full. No crepitus is noted in the temporal need to joints.  Skin: Extremities are without significant edema.  Neurologic Exam  Mental status: The patient is alert and oriented x 3 at the time of the examination. The patient has apparent normal recent and remote memory, with an apparently normal attention span and concentration ability.  Cranial nerves: Facial symmetry is present. There is good sensation of the face to pinprick and soft touch bilaterally. The strength of the facial muscles and the muscles to head turning and shoulder shrug are normal bilaterally. Speech is well enunciated, no aphasia or dysarthria is noted. Extraocular movements are full. Visual fields are full. The tongue is midline, and the patient has symmetric elevation of the soft palate. No obvious hearing deficits are noted.  Motor: The motor testing reveals 5 over 5 strength of all 4 extremities. Good symmetric motor tone is noted throughout.  Sensory: Sensory testing is intact to pinprick, soft touch, vibration sensation, and position sense on all 4 extremities. No evidence of extinction is noted.  Coordination: Cerebellar testing reveals good finger-nose-finger and heel-to-shin bilaterally.  Gait and station: Gait is normal. Tandem gait is normal. Romberg is negative. No drift is seen.  Reflexes: Deep tendon reflexes are symmetric and normal bilaterally. Toes are downgoing bilaterally.   Assessment/Plan:  1. Chronic daily headache  The patient has started having daily headache, the headache started with some visual scotoma and scintillating scotoma consistent with migraine headache. The  patient will be started on Topamax. His sister has migraine. The patient will be set up for MRI evaluation of the brain, the last MRI was done 3 years ago. He will be set up for blood work today to include a sedimentation rate and a Lyme antibody panel. He will follow-up in 2 or 3 months.  Jill Alexanders MD 03/08/2015 7:18 PM  [facility] [address] [place], [place] [address]  Phone [phone] Fax [phone]

## 2015-03-09 LAB — SEDIMENTATION RATE: Sed Rate: 2 mm/hr (ref 0–30)

## 2015-03-09 LAB — B. BURGDORFI ANTIBODIES: Lyme IgG/IgM Ab: <0.91 {ISR} (ref 0.00–0.90)

## 2015-03-12 ENCOUNTER — Ambulatory Visit
Admission: RE | Admit: 2015-03-12 | Discharge: 2015-03-12 | Disposition: A | Discharge status: Home / Self Care | Payer: Medicare Other | Source: Ambulatory Visit | Attending: Neurology | Admitting: Neurology

## 2015-03-12 DIAGNOSIS — R51 Headache: Principal | ICD-10-CM

## 2015-03-12 DIAGNOSIS — R519 Headache, unspecified: Secondary | ICD-10-CM

## 2015-03-15 ENCOUNTER — Telehealth: Payer: Self-pay | Admitting: Neurology

## 2015-03-15 NOTE — Telephone Encounter (Signed)
I called patient. MRI brain is relatively unremarkable, very minimal white matter changes, may be related to the history of migraine.   MRI brain 03/12/2015:  IMPRESSION: This is an abnormal MRI of the brain with and without contrast showed the following: 1. Small chronic microhemorrhage with hemosiderin deposition adjacent to the right lateral ventricle in the parietal lobe, best seen on susceptibility weighted images. 2. Mild extent of T2/FLAIR hyperintense foci in the subcortical and deep white matter of the hemispheres consistent with mild chronic age-related microvascular ischemic changes.

## 2015-04-14 ENCOUNTER — Telehealth: Payer: Self-pay | Admitting: Neurology

## 2015-04-14 NOTE — Telephone Encounter (Signed)
I have received a disc of the MRI the brain was done on 02/14/2012. I compared to the most recent MRI the brain was done. There does not appear to be any significant change in the minimal white matter changes noted.

## 2015-06-08 ENCOUNTER — Ambulatory Visit (INDEPENDENT_AMBULATORY_CARE_PROVIDER_SITE_OTHER): Payer: Medicare Other | Admitting: Neurology

## 2015-06-08 ENCOUNTER — Encounter: Payer: Self-pay | Admitting: Neurology

## 2015-06-08 VITALS — BP 123/74 | HR 75 | Ht 74.0 in | Wt 237.0 lb

## 2015-06-08 DIAGNOSIS — R519 Headache, unspecified: Secondary | ICD-10-CM

## 2015-06-08 DIAGNOSIS — R51 Headache: Secondary | ICD-10-CM

## 2015-06-08 NOTE — Progress Notes (Signed)
Reason for visit: Headache   Leonard Williams is an 69 y.o. male  History of present illness:  Leonard Williams is a 69 year old right-handed white male with a history of daily headaches. The patient has had migraine-type headaches previously, some headaches associated with visual aura, he was having on average one headache a week prior to his initial evaluation here. The patient has had headaches that have converted to daily in nature, mainly around the eyes, bifrontal and temporal. The headaches have reduced in severity by about 50%, but are still daily. He is on Topamax to 75 mg at night, tolerating the medication well. The headaches did not keep him from doing anything at this time. MRI of the brain showed a small micro-hemorrhage in the right parietal area, and a mucous retention cyst in the left maxillary sinus. The MRI of the brain showed minimal white matter changes. He returns to this office for an evaluation. He denies any nausea, vomiting, or concentration impairment. He will be getting new glasses in the near future.  Past Medical History  Diagnosis Date  . Glaucoma   . Arthritis     mild arthritis- neck shoulders  . History of colonic diverticulitis     left  . Glaucoma   . Glaucoma   . Beverely Low Gunn pupil ([place])   . Cancer [facility])     GI tumor  . Headache disorder 03/08/2015    Past Surgical History  Procedure Laterality Date  . Eye surgery      laser surgery for glaucoma  . Tonsillectomy    . Hernia repair Left     groin  . Esophagogastroduodenoscopy (egd) with propofol N/A 01/21/2015    Procedure: ESOPHAGOGASTRODUODENOSCOPY (EGD) WITH PROPOFOL;  Surgeon: Garlan Fair, MD;  Location: [facility];  Service: Endoscopy;  Laterality: N/A;  . Colonoscopy N/A 01/21/2015    Procedure: COLONOSCOPY;  Surgeon: Garlan Fair, MD;  Location: [facility];  Service: Endoscopy;  Laterality: N/A;  . Eus N/A 02/04/2015    Procedure: UPPER ENDOSCOPIC ULTRASOUND (EUS) LINEAR;  Surgeon:  Milus Banister, MD;  Location: [facility];  Service: Endoscopy;  Laterality: N/A;    Family History  Problem Relation Age of Onset  . Glaucoma Mother   . Dementia Father   . Graves' disease Sister   . Heart disease Sister   . Migraines Sister   . Heart attack Brother   . CAD Brother     Social history:  reports that he has never smoked. He has never used smokeless tobacco. He reports that he drinks about 0.6 - 1.2 oz of alcohol per week. He reports that he does not use illicit drugs.   No Known Allergies  Medications:  Prior to Admission medications   Medication Sig Start Date End Date Taking? Authorizing Provider  DORZOLAMIDE HCL-TIMOLOL MAL OP Place 1 drop into both eyes 2 (two) times daily.   Yes Historical Provider, MD  latanoprost (XALATAN) 0.005 % ophthalmic solution Place 1 drop into both eyes at bedtime.    Yes Historical Provider, MD  Multiple Vitamin (MULTIVITAMIN WITH MINERALS) TABS tablet Take 1 tablet by mouth every evening.    Yes Historical Provider, MD  Multiple Vitamins-Minerals (CENTRUM SILVER PO) Take 1 tablet by mouth daily.   Yes Historical Provider, MD  Naproxen Sodium (ALEVE PO) Take by mouth as needed.   Yes Historical Provider, MD    ROS:  Out of a complete 14 system review of symptoms, the patient complains only  of the following symptoms, and all other reviewed systems are negative.  Ringing in the ears, runny nose Cough Borderline sleep apnea Headache  Blood pressure 123/74, pulse 75, height 6\' 2"  (1.88 m), weight 237 lb (107.502 kg).  Physical Exam  General: The patient is alert and cooperative at the time of the examination.  Skin: No significant peripheral edema is noted.   Neurologic Exam  Mental status: The patient is alert and oriented x 3 at the time of the examination. The patient has apparent normal recent and remote memory, with an apparently normal attention span and concentration ability.   Cranial nerves: Facial symmetry is  present. Speech is normal, no aphasia or dysarthria is noted. Extraocular movements are full. Visual fields are full.  Motor: The patient has good strength in all 4 extremities.  Sensory examination: Soft touch sensation is symmetric on the face, arms, and legs.  Coordination: The patient has good finger-nose-finger and heel-to-shin bilaterally.  Gait and station: The patient has a normal gait. Tandem gait is normal. Romberg is negative. No drift is seen.  Reflexes: Deep tendon reflexes are symmetric.   MRI brain 03/13/15:  IMPRESSION: This is an abnormal MRI of the brain with and without contrast showed the following: 1. Small chronic microhemorrhage with hemosiderin deposition adjacent to the right lateral ventricle in the parietal lobe, best seen on susceptibility weighted images. 2. Mild extent of T2/FLAIR hyperintense foci in the subcortical and deep white matter of the hemispheres consistent with mild chronic age-related microvascular ischemic changes.   * MRI scan images were reviewed online. I agree with the written report.    Assessment/Plan:  One. Chronic daily headache  The patient has had some benefit with the severity of the headache with the Topamax, but the headaches remain daily. We will push the dose up gradually to 150 mg daily. If this does not offer benefit, the patient will contact our office, we will consider switching to another medication such as nortriptyline. He will follow up otherwise in 4 months.  Leonard Alexanders MD 06/08/2015 8:04 PM  [facility] [address] [place], [place] [address]  Phone [phone] Fax [phone]

## 2015-06-15 ENCOUNTER — Telehealth: Payer: Self-pay | Admitting: Neurology

## 2015-06-15 MED ORDER — TOPIRAMATE 100 MG PO TABS: 100.0000 mg | ORAL_TABLET | Freq: Every day | ORAL | Status: DC

## 2015-06-15 MED ORDER — TOPIRAMATE 50 MG PO TABS: 50.0000 mg | ORAL_TABLET | Freq: Every day | ORAL | Status: DC

## 2015-06-15 NOTE — Telephone Encounter (Signed)
Patient called back, Pharmacist at [facility] recommended instead of 2 Rx's 1 for 100 mg, 1 for 50 mg to change Rx to 1 Rx for 3- 50 mg tablets, that way patient won't have to cut in half and insurance will pay for it.

## 2015-06-15 NOTE — Telephone Encounter (Signed)
Patient called to request Topiramate increase from 75 mg to 150 mg, [employer].

## 2015-06-15 NOTE — Telephone Encounter (Signed)
I have spoken with Leonard Williams.  He is currently taking Topamax 125mg  daily, still having h/a's, would like to increase to 150mg  daily as discussed at last ov. Rx's pended for Dr. Jannifer Franklin to release/fim

## 2015-06-15 NOTE — Telephone Encounter (Signed)
LMOM (identified vm)that he shouldn't have to cut any tablets in half--Rx. for 100mg  and 50mg  tabs sent in, so he will take one of each.  May call if he has other questions/fim

## 2015-06-17 MED ORDER — TOPIRAMATE 50 MG PO TABS: 150.0000 mg | ORAL_TABLET | Freq: Every day | ORAL | Status: DC

## 2015-06-17 NOTE — Addendum Note (Signed)
Addended by: Margette Fast on: 06/17/2015 05:36 PM   Modules accepted: Orders, Medications

## 2015-06-17 NOTE — Telephone Encounter (Signed)
I called the patient's wife. She insists that the patient take only one Rx for Topamax. They wish to take 3-50 mg tablets as opposed to 1-100 mg and 1-50 mg tablet.

## 2015-06-17 NOTE — Telephone Encounter (Signed)
I will change the prescription

## 2015-06-17 NOTE — Telephone Encounter (Signed)
Patient's wife is calling. She would like a Rx for the patient for topiramate (TOPAMAX) 50 MG tablet with directions saying to take 3 daily called to [facility] on [address].. The patient did not pick up the Rx's that were called in for the patient on 06-15-15. Thank you.

## 2015-09-29 ENCOUNTER — Encounter: Payer: Self-pay | Admitting: Neurology

## 2015-09-29 ENCOUNTER — Ambulatory Visit (INDEPENDENT_AMBULATORY_CARE_PROVIDER_SITE_OTHER): Payer: Medicare Other | Admitting: Neurology

## 2015-09-29 VITALS — BP 108/64 | HR 69 | Ht 74.0 in | Wt 239.0 lb

## 2015-09-29 DIAGNOSIS — R51 Headache: Secondary | ICD-10-CM

## 2015-09-29 DIAGNOSIS — R519 Headache, unspecified: Secondary | ICD-10-CM

## 2015-09-29 NOTE — Progress Notes (Signed)
Reason for visit: Migraine headache  Leonard Williams is an 70 y.o. male  History of present illness:  Leonard Williams is a 70 year old right-handed white male with a history of migraine headaches. The patient had daily headaches when he was seen last, he was placed on Topamax and the dose was increased to 150 mg at night. The patient has done quite well, he now is having about 2 headaches a month, and he is tolerating the Topamax quite well. He will occasionally will have some tingling in the fingers and toes, otherwise no significant issues with weight loss or cognitive changes. He denies any other significant medical issues that have come up since last seen. He is quite satisfied with the control the headaches at this time.  Past Medical History  Diagnosis Date  . Glaucoma   . Arthritis     mild arthritis- neck shoulders  . History of colonic diverticulitis     left  . Glaucoma   . Glaucoma   . 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 Cancer [facility])     GI tumor  . Headache disorder 03/08/2015    Past Surgical History  Procedure Laterality Date  . Eye surgery      laser surgery for glaucoma  . Tonsillectomy    . Hernia repair Left     groin  . Esophagogastroduodenoscopy (egd) with propofol N/A 01/21/2015    Procedure: ESOPHAGOGASTRODUODENOSCOPY (EGD) WITH PROPOFOL;  Surgeon: Leonard Fair, MD;  Location: [facility];  Service: Endoscopy;  Laterality: N/A;  . Colonoscopy N/A 01/21/2015    Procedure: COLONOSCOPY;  Surgeon: Leonard Fair, MD;  Location: [facility];  Service: Endoscopy;  Laterality: N/A;  . Eus N/A 02/04/2015    Procedure: UPPER ENDOSCOPIC ULTRASOUND (EUS) LINEAR;  Surgeon: Leonard Banister, MD;  Location: [facility];  Service: Endoscopy;  Laterality: N/A;    Family History  Problem Relation Age of Onset  . Glaucoma Mother   . Dementia Father   . Graves' disease Sister   . Heart disease Sister   . Migraines Sister   . Heart attack Brother   . CAD Brother      Social history:  reports that he has never smoked. He has never used smokeless tobacco. He reports that he drinks about 0.6 - 1.2 oz of alcohol per week. He reports that he does not use illicit drugs.   No Known Allergies  Medications:  Prior to Admission medications   Medication Sig Start Date End Date Taking? Authorizing Provider  DORZOLAMIDE HCL-TIMOLOL MAL OP Place 1 drop into both eyes 2 (two) times daily.   Yes Historical Provider, MD  latanoprost (XALATAN) 0.005 % ophthalmic solution Place 1 drop into both eyes at bedtime.    Yes Historical Provider, MD  Multiple Vitamin (MULTIVITAMIN WITH MINERALS) TABS tablet Take 1 tablet by mouth every evening.    Yes Historical Provider, MD  Multiple Vitamins-Minerals (CENTRUM SILVER PO) Take 1 tablet by mouth daily.   Yes Historical Provider, MD  Naproxen Sodium (ALEVE PO) Take by mouth as needed.   Yes Historical Provider, MD  topiramate (TOPAMAX) 50 MG tablet Take 3 tablets (150 mg total) by mouth daily. 06/17/15  Yes Leonard Ducking, MD  VIAGRA 100 MG tablet TAKE 1/2 TO 1 TABLET EVERY 24 HOURS AS NEEDED 08/09/15  Yes Historical Provider, MD    ROS:  Out of a complete 14 system review of symptoms, the patient complains only of the following symptoms, and  all other reviewed systems are negative.  Headache  Blood pressure 108/64, pulse 69, height 6\' 2"  (1.88 m), weight 239 lb (108.41 kg).  Physical Exam  General: The patient is alert and cooperative at the time of the examination.  Skin: No significant peripheral edema is noted.   Neurologic Exam  Mental status: The patient is alert and oriented x 3 at the time of the examination. The patient has apparent normal recent and remote memory, with an apparently normal attention span and concentration ability.   Cranial nerves: Facial symmetry is present. Speech is normal, no aphasia or dysarthria is noted. Extraocular movements are full. Visual fields are full.  Motor: The patient  has good strength in all 4 extremities.  Sensory examination: Soft touch sensation is symmetric on the face, arms, and legs.  Coordination: The patient has good finger-nose-finger and heel-to-shin bilaterally.  Gait and station: The patient has a normal gait. Tandem gait is normal. Romberg is negative. No drift is seen.  Reflexes: Deep tendon reflexes are symmetric.   Assessment/Plan:  1. Migraine headache  The patient doing well at this time on Topamax, we will continue the medication, he will follow-up in one year, sooner if needed.  Jill Alexanders MD 09/29/2015 9:20 AM  [facility] [address] [place], [place] [address]  Phone [phone] Fax [phone]

## 2015-09-29 NOTE — Patient Instructions (Signed)

## 2015-11-04 DIAGNOSIS — H25813 Combined forms of age-related cataract, bilateral: Secondary | ICD-10-CM | POA: Diagnosis not present

## 2015-11-04 DIAGNOSIS — H40011 Open angle with borderline findings, low risk, right eye: Secondary | ICD-10-CM | POA: Diagnosis not present

## 2015-11-12 ENCOUNTER — Other Ambulatory Visit: Payer: Self-pay | Admitting: Neurology

## 2015-11-26 ENCOUNTER — Telehealth: Payer: Self-pay | Admitting: Gastroenterology

## 2015-11-26 NOTE — Telephone Encounter (Signed)
Left message on machine to call back pt due for EUS at the end of July

## 2015-11-29 NOTE — Telephone Encounter (Signed)
Pt notified he will be called closer to the due date in July for the procedure.  He agreed and will call back at the end of June to set up if he does not hear from me.

## 2016-01-12 DIAGNOSIS — G43009 Migraine without aura, not intractable, without status migrainosus: Secondary | ICD-10-CM | POA: Diagnosis not present

## 2016-01-12 DIAGNOSIS — H409 Unspecified glaucoma: Secondary | ICD-10-CM | POA: Diagnosis not present

## 2016-01-12 DIAGNOSIS — Z136 Encounter for screening for cardiovascular disorders: Secondary | ICD-10-CM | POA: Diagnosis not present

## 2016-01-12 DIAGNOSIS — Z0000 Encounter for general adult medical examination without abnormal findings: Secondary | ICD-10-CM | POA: Diagnosis not present

## 2016-01-12 DIAGNOSIS — Z125 Encounter for screening for malignant neoplasm of prostate: Secondary | ICD-10-CM | POA: Diagnosis not present

## 2016-01-12 DIAGNOSIS — Z86010 Personal history of colonic polyps: Secondary | ICD-10-CM | POA: Diagnosis not present

## 2016-01-12 DIAGNOSIS — D214 Benign neoplasm of connective and other soft tissue of abdomen: Secondary | ICD-10-CM | POA: Diagnosis not present

## 2016-01-14 ENCOUNTER — Other Ambulatory Visit: Payer: Self-pay

## 2016-01-14 ENCOUNTER — Telehealth: Payer: Self-pay

## 2016-01-14 DIAGNOSIS — K3189 Other diseases of stomach and duodenum: Secondary | ICD-10-CM

## 2016-01-14 NOTE — Telephone Encounter (Signed)
01/20/16 130 pm EUS   Left message on machine to call back

## 2016-01-17 NOTE — Telephone Encounter (Signed)
Pt EUS moved to 02/03/16 830 per pt request he will be out of town.

## 2016-01-31 ENCOUNTER — Encounter (HOSPITAL_COMMUNITY): Payer: Self-pay | Admitting: *Deleted

## 2016-02-02 NOTE — Anesthesia Preprocedure Evaluation (Addendum)
Anesthesia Evaluation  Patient identified by MRN, date of birth, ID band Patient awake    Reviewed: Allergy & Precautions, H&P , NPO status , Patient's Chart, lab work & pertinent test results  Airway Mallampati: II   Neck ROM: Full    Dental no notable dental hx. (+) Teeth Intact, Dental Advisory Given   Pulmonary neg pulmonary ROS,    Pulmonary exam normal breath sounds clear to auscultation       Cardiovascular negative cardio ROS   Rhythm:Regular Rate:Normal     Neuro/Psych  Headaches, negative psych ROS   GI/Hepatic Neg liver ROS, GERD  Medicated,  Endo/Other  negative endocrine ROS  Renal/GU negative Renal ROS  negative genitourinary   Musculoskeletal  (+) Arthritis , Osteoarthritis,    Abdominal   Peds  Hematology negative hematology ROS (+)   Anesthesia Other Findings   Reproductive/Obstetrics negative OB ROS                            Anesthesia Physical Anesthesia Plan  ASA: II  Anesthesia Plan: MAC   Post-op Pain Management:    Induction: Intravenous  Airway Management Planned: Nasal Cannula  Additional Equipment:   Intra-op Plan:   Post-operative Plan:   Informed Consent: I have reviewed the patients History and Physical, chart, labs and discussed the procedure including the risks, benefits and alternatives for the proposed anesthesia with the patient or authorized representative who has indicated his/her understanding and acceptance.   Dental advisory given  Plan Discussed with: CRNA  Anesthesia Plan Comments:         Anesthesia Quick Evaluation

## 2016-02-03 ENCOUNTER — Encounter (HOSPITAL_COMMUNITY)
Admission: RE | Disposition: A | Discharge status: Home / Self Care | Payer: Self-pay | Source: Ambulatory Visit | Attending: Gastroenterology

## 2016-02-03 ENCOUNTER — Ambulatory Visit (HOSPITAL_COMMUNITY): Payer: Medicare Other | Admitting: Anesthesiology

## 2016-02-03 ENCOUNTER — Encounter (HOSPITAL_COMMUNITY): Payer: Self-pay

## 2016-02-03 ENCOUNTER — Ambulatory Visit (HOSPITAL_COMMUNITY)
Admission: RE | Admit: 2016-02-03 | Discharge: 2016-02-03 | Disposition: A | Discharge status: Home / Self Care | Payer: Medicare Other | Source: Ambulatory Visit | Attending: Gastroenterology | Admitting: Gastroenterology

## 2016-02-03 DIAGNOSIS — D131 Benign neoplasm of stomach: Secondary | ICD-10-CM | POA: Diagnosis not present

## 2016-02-03 DIAGNOSIS — K219 Gastro-esophageal reflux disease without esophagitis: Secondary | ICD-10-CM | POA: Diagnosis not present

## 2016-02-03 DIAGNOSIS — R51 Headache: Secondary | ICD-10-CM | POA: Insufficient documentation

## 2016-02-03 DIAGNOSIS — K3189 Other diseases of stomach and duodenum: Secondary | ICD-10-CM | POA: Insufficient documentation

## 2016-02-03 DIAGNOSIS — K449 Diaphragmatic hernia without obstruction or gangrene: Secondary | ICD-10-CM | POA: Insufficient documentation

## 2016-02-03 DIAGNOSIS — Z79899 Other long term (current) drug therapy: Secondary | ICD-10-CM | POA: Diagnosis not present

## 2016-02-03 DIAGNOSIS — M159 Polyosteoarthritis, unspecified: Secondary | ICD-10-CM | POA: Diagnosis not present

## 2016-02-03 HISTORY — PX: EUS: SHX5427

## 2016-02-03 HISTORY — DX: Personal history of other diseases of the digestive system: Z87.19

## 2016-02-03 SURGERY — UPPER ENDOSCOPIC ULTRASOUND (EUS) RADIAL
Anesthesia: Monitor Anesthesia Care

## 2016-02-03 MED ORDER — LIDOCAINE HCL (CARDIAC) 20 MG/ML IV SOLN
INTRAVENOUS | Status: DC | PRN
  Administered 2016-02-03: 50 mg via INTRAVENOUS

## 2016-02-03 MED ORDER — PROPOFOL 10 MG/ML IV BOLUS
INTRAVENOUS | Status: AC
  Filled 2016-02-03: qty 40

## 2016-02-03 MED ORDER — LIDOCAINE HCL (CARDIAC) 20 MG/ML IV SOLN
INTRAVENOUS | Status: AC
  Filled 2016-02-03: qty 5

## 2016-02-03 MED ORDER — LACTATED RINGERS IV SOLN
INTRAVENOUS | Status: DC
  Administered 2016-02-03: 1000 mL via INTRAVENOUS

## 2016-02-03 MED ORDER — PROPOFOL 500 MG/50ML IV EMUL
INTRAVENOUS | Status: DC | PRN
  Administered 2016-02-03: 100 ug/kg/min via INTRAVENOUS

## 2016-02-03 MED ORDER — SODIUM CHLORIDE 0.9 % IV SOLN: INTRAVENOUS | Status: DC

## 2016-02-03 MED ORDER — PROPOFOL 10 MG/ML IV BOLUS
INTRAVENOUS | Status: DC | PRN
  Administered 2016-02-03: 20 mg via INTRAVENOUS

## 2016-02-03 NOTE — Op Note (Signed)
[facility] Patient Name: Leonard Williams Procedure Date: 02/03/2016 MRN: [id number] Attending MD: Milus Banister , MD Date of Birth: 28-Sep-1945 CSN: [id number] Age: 70 Admit Type: Outpatient Procedure:                Upper EUS Indications:              Gastric deformity on endoscopy/Subepithelial tumor                            versus extrinsic compression; Incidentally noted                            2016 Dr. Wynetta Emery EGD; follow up EUS Dr. Ardis Hughs 2016                            found 14mm subepithelial lesion, presumed GIST                            based on typical EUS morphology. Providers:                Milus Banister, MD, Cleda Daub, RN, Otilio Saber, Technician Referring MD:             Prescilla Sours, MD Medicines:                Monitored Anesthesia Care Complications:            No immediate complications. Estimated blood loss:                            None. Estimated Blood Loss:     Estimated blood loss: none. Procedure:                Pre-Anesthesia Assessment:                           - Prior to the procedure, a History and Physical                            was performed, and patient medications and                            allergies were reviewed. The patient's tolerance of                            previous anesthesia was also reviewed. The risks                            and benefits of the procedure and the sedation                            options and risks were discussed with the patient.  All questions were answered, and informed consent                            was obtained. Prior Anticoagulants: The patient has                            taken no previous anticoagulant or antiplatelet                            agents. ASA Grade Assessment: II - A patient with                            mild systemic disease. After reviewing the risks                            and  benefits, the patient was deemed in                            satisfactory condition to undergo the procedure.                           After obtaining informed consent, the endoscope was                            passed under direct vision. Throughout the                            procedure, the patient's blood pressure, pulse, and                            oxygen saturations were monitored continuously. The                            [id number]) scope was introduced through                            the mouth, and advanced to the second part of                            duodenum. The upper EUS was accomplished without                            difficulty. The patient tolerated the procedure                            well. Scope In: Scope Out: Findings:      Endoscopic Finding :      1. The examined esophagus was endoscopically normal.      2. A small, submucosal, non-circumferential mass with no bleeding and no       stigmata of recent bleeding was found in the gastric fundus. This was       approximatelyl 1cm across.      3. The examined duodenum was endoscopically normal.      Endosonographic Finding :  1. The lesion above correlated with a round intramural (subepithelial)       lesion. The lesion was hypoechoic. Sonographically, the lesion appeared       to originate from the muscularis propria (Layer 4). The lesion measured       11.5 mm diameter, maximum. The outer endosonographic borders were well       defined.      2. No perigastric adenopathy.      3. Limited evaluation fo pancreas, gallbladder, liver, spleen were all       normal. Impression:               - The proximal gastric lesion shows no sign of                            signifcant change in 12 months; I still presume it                            to be a small GIST and at this size (<2cm) I do not                            recommend surgical resection. Moderate Sedation:      N/A- Per  Anesthesia Care Recommendation:           - Discharge patient to home (ambulatory).                           - My office will arrange repeat EUS in 2 years. Procedure Code(s):        --- Professional ---                           [phone], Esophagogastroduodenoscopy, flexible,                            transoral; with endoscopic ultrasound examination                            limited to the esophagus, stomach or duodenum, and                            adjacent structures Diagnosis Code(s):        --- Professional ---                           D13.1, Benign neoplasm of stomach                           K31.89, Other diseases of stomach and duodenum CPT copyright 2016 American Medical Association. All rights reserved. The codes documented in this report are preliminary and upon coder review may  be revised to meet current compliance requirements. Milus Banister, MD 02/03/2016 9:17:04 AM This report has been signed electronically. Number of Addenda: 0

## 2016-02-03 NOTE — Transfer of Care (Signed)
Immediate Anesthesia Transfer of Care Note  Patient: Leonard Williams  Procedure(s) Performed: Procedure(s): UPPER ENDOSCOPIC ULTRASOUND (EUS) RADIAL (N/A)  Patient Location: PACU  Anesthesia Type:MAC  Level of Consciousness: awake, alert  and oriented  Airway & Oxygen Therapy: Patient Spontanous Breathing and Patient connected to nasal cannula oxygen  Post-op Assessment: Report given to RN and Post -op Vital signs reviewed and stable  Post vital signs: Reviewed and stable  Last Vitals:  Vitals:   02/03/16 0720  BP: 138/70  Pulse: (!) 57  Resp: 12  Temp: 36.9 C    Last Pain:  Vitals:   02/03/16 0720  TempSrc: Oral         Complications: No apparent anesthesia complications

## 2016-02-03 NOTE — Anesthesia Postprocedure Evaluation (Signed)
Anesthesia Post Note  Patient: Leonard Williams  Procedure(s) Performed: Procedure(s) (LRB): UPPER ENDOSCOPIC ULTRASOUND (EUS) RADIAL (N/A)  Patient location during evaluation: PACU Anesthesia Type: MAC Level of consciousness: awake and alert Pain management: pain level controlled Vital Signs Assessment: post-procedure vital signs reviewed and stable Respiratory status: spontaneous breathing, nonlabored ventilation and respiratory function stable Cardiovascular status: stable and blood pressure returned to baseline Anesthetic complications: no    Last Vitals:  Vitals:   02/03/16 0920 02/03/16 0930  BP: 105/70 125/69  Pulse: (!) 50 (!) 52  Resp: 16 14  Temp:  36.3 C    Last Pain:  Vitals:   02/03/16 0930  TempSrc: Oral                 Kasia Trego,W. EDMOND

## 2016-02-03 NOTE — Discharge Instructions (Signed)

## 2016-02-03 NOTE — H&P (Signed)
  HPI: This is a man with incidentally noted distal gastric lesion, evaluated by EUS 2016   Chief complaint is gastric lesion   Past Medical History:  Diagnosis Date  . Arthritis    mild arthritis- neck shoulders, more right shoulder-no problems at present.  . Cancer [facility])    GI tumor- stomach tumor "nonmalignant" last check 1 yr ago.  . Gastric mass    hx gastric mass- benign  . Glaucoma   . Glaucoma   . Glaucoma   . Headache disorder 03/08/2015   Topamax taken daily-very low grade headache to none now.  . History of colonic diverticulitis    left  . History of hiatal hernia    omeprazole as needed- "mild"  . [name] pupil [facility])     Past Surgical History:  Procedure Laterality Date  . COLONOSCOPY N/A 01/21/2015   Procedure: COLONOSCOPY;  Surgeon: Garlan Fair, MD;  Location: [facility];  Service: Endoscopy;  Laterality: N/A;-polyp removed in past-benign.  . ESOPHAGOGASTRODUODENOSCOPY (EGD) WITH PROPOFOL N/A 01/21/2015   Procedure: ESOPHAGOGASTRODUODENOSCOPY (EGD) WITH PROPOFOL;  Surgeon: Garlan Fair, MD;  Location: [facility];  Service: Endoscopy;  Laterality: N/A;  . EUS N/A 02/04/2015   Procedure: UPPER ENDOSCOPIC ULTRASOUND (EUS) LINEAR;  Surgeon: Milus Banister, MD;  Location: [facility];  Service: Endoscopy;  Laterality: N/A;  . EYE SURGERY     laser surgery for glaucoma  . HERNIA REPAIR Left    groin  . TONSILLECTOMY      Current Facility-Administered Medications  Medication Dose Route Frequency Provider Last Rate Last Dose  . 0.9 %  sodium chloride infusion   Intravenous Continuous Milus Banister, MD        Allergies as of 01/14/2016  . (No Known Allergies)    Family History  Problem Relation Age of Onset  . Glaucoma Mother   . Dementia Father   . Graves' disease Sister   . Heart disease Sister   . Migraines Sister   . Heart attack Brother   . CAD Brother     Social History   Social History  . Marital status: Married    Spouse  name: N/A  . Number of children: 3  . Years of education: BA   Occupational History  . retired    Social History Main Topics  . Smoking status: Never Smoker  . Smokeless tobacco: Never Used  . Alcohol use 1.2 - 2.4 oz/week    1 - 2 Standard drinks or equivalent, 1 - 2 Cans of beer per week     Comment: social - 1-2 beer per  week  . Drug use: No  . Sexual activity: Yes    Birth control/ protection: None   Other Topics Concern  . Not on file   Social History Narrative   Patient drinks 2 cups of caffeine daily.   Patient is right handed.     Physical Exam: There were no vitals taken for this visit. Constitutional: generally well-appearing Psychiatric: alert and oriented x3 Abdomen: soft, nontender, nondistended, no obvious ascites, no peritoneal signs, normal bowel sounds   Assessment and plan: 70 y.o. male with gastric lesion  Presumed to be a GIST (<2cm) in 2016 by EUS morphology. Here for surveillance EUS, check for interval change.   Owens Loffler, MD [facility] 02/03/2016, 7:13 AM

## 2016-02-04 ENCOUNTER — Encounter (HOSPITAL_COMMUNITY): Payer: Self-pay | Admitting: Gastroenterology

## 2016-02-14 ENCOUNTER — Other Ambulatory Visit: Payer: Self-pay | Admitting: Neurology

## 2016-03-01 DIAGNOSIS — R0789 Other chest pain: Secondary | ICD-10-CM | POA: Diagnosis not present

## 2016-03-01 DIAGNOSIS — R002 Palpitations: Secondary | ICD-10-CM | POA: Diagnosis not present

## 2016-03-02 DIAGNOSIS — H401111 Primary open-angle glaucoma, right eye, mild stage: Secondary | ICD-10-CM | POA: Diagnosis not present

## 2016-03-02 DIAGNOSIS — H401122 Primary open-angle glaucoma, left eye, moderate stage: Secondary | ICD-10-CM | POA: Diagnosis not present

## 2016-03-02 DIAGNOSIS — H2513 Age-related nuclear cataract, bilateral: Secondary | ICD-10-CM | POA: Diagnosis not present

## 2016-03-08 ENCOUNTER — Ambulatory Visit (INDEPENDENT_AMBULATORY_CARE_PROVIDER_SITE_OTHER): Payer: Medicare Other | Admitting: Cardiology

## 2016-03-08 ENCOUNTER — Encounter: Payer: Self-pay | Admitting: Cardiology

## 2016-03-08 VITALS — BP 124/80 | HR 62 | Ht 74.0 in | Wt 240.0 lb

## 2016-03-08 DIAGNOSIS — R002 Palpitations: Secondary | ICD-10-CM

## 2016-03-08 DIAGNOSIS — R0789 Other chest pain: Secondary | ICD-10-CM | POA: Diagnosis not present

## 2016-03-08 NOTE — Patient Instructions (Signed)
Medication Instructions:  The current medical regimen is effective;  continue present plan and medications.  Testing/Procedures: Your physician has requested that you have an echocardiogram. Echocardiography is a painless test that uses sound waves to create images of your heart. It provides your doctor with information about the size and shape of your heart and how well your heart's chambers and valves are working. This procedure takes approximately one hour. There are no restrictions for this procedure.  Your physician has requested that you have an exercise tolerance test. For further information please visit [email or web address]. Please also follow instruction sheet, as given.  Your physician has recommended that you wear a holter monitor for 24 hours. Holter monitors are medical devices that record the heart's electrical activity. Doctors most often use these monitors to diagnose arrhythmias. Arrhythmias are problems with the speed or rhythm of the heartbeat. The monitor is a small, portable device. You can wear one while you do your normal daily activities. This is usually used to diagnose what is causing palpitations/syncope (passing out).  Follow-Up: Please follow up as needed after the above testing.  If you need a refill on your cardiac medications before your next appointment, please call your pharmacy.  Thank you for choosing [facility]!!

## 2016-03-08 NOTE — Progress Notes (Signed)
Cardiology Office Note    Date:  03/08/2016   ID:  Abdulelah, Vivenzio Oct 16, 1945, MRN [id number]  PCP:  Garlan Fair, MD  Cardiologist:   Candee Furbish, MD     History of Present Illness:  Leonard Williams is a 70 y.o. male here for evaluation of intermittent palpitations and chest discomfort at the request of Dr. Wynetta Emery. Back in 2005 he had a normal nuclear stress test. No prior heart history. He's had some palpitations as well as chest discomfort over the past month or so. They usually resolve spontaneously after a few minutes duration. He has had EGD showing normal-appearing esophagus and 11.5 mm gastric GIST.  Under sternum. Feels a heavy heart beat, squeeze, like too much caffiene. Doesn't always hurt. Feels up to throat.   [place] 15 years ago stress test.   Brother has coronary artery disease. Grandfather died at 85. Mother and Father 46's. Sister - ablation. Her daughters had to drop out of athletics due to arrhythmia. Nonsmoker.  Past Medical History:  Diagnosis Date  . Arthritis    mild arthritis- neck shoulders, more right shoulder-no problems at present.  . Cancer [facility])    GI tumor- stomach tumor "nonmalignant" last check 1 yr ago.  . Gastric mass    hx gastric mass- benign  . Glaucoma   . Glaucoma   . Glaucoma   . Headache disorder 03/08/2015   Topamax taken daily-very low grade headache to none now.  . History of colonic diverticulitis    left  . History of hiatal hernia    omeprazole as needed- "mild"  . 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 COLONOSCOPY N/A 01/21/2015   Procedure: COLONOSCOPY;  Surgeon: Garlan Fair, MD;  Location: [facility];  Service: Endoscopy;  Laterality: N/A;-polyp removed in past-benign.  . ESOPHAGOGASTRODUODENOSCOPY (EGD) WITH PROPOFOL N/A 01/21/2015   Procedure: ESOPHAGOGASTRODUODENOSCOPY (EGD) WITH PROPOFOL;  Surgeon: Garlan Fair, MD;  [facility];   Service: Endoscopy;  Laterality: N/A;  . EUS N/A 02/04/2015   Procedure: UPPER ENDOSCOPIC ULTRASOUND (EUS) LINEAR;  Surgeon: Milus Banister, MD;  [facility];  Service: Endoscopy;  Laterality: N/A;  . EUS N/A 02/03/2016   Procedure: UPPER ENDOSCOPIC ULTRASOUND (EUS) RADIAL;  Surgeon: Milus Banister, MD;  [facility];  Service: Endoscopy;  Laterality: N/A;  . EYE SURGERY     laser surgery for glaucoma  . HERNIA REPAIR Left    groin  . TONSILLECTOMY      Current Medications: Outpatient Medications Prior to Visit  Medication Sig Dispense Refill  . DORZOLAMIDE HCL-TIMOLOL MAL OP Place 1 drop into both eyes 2 (two) times daily.    . famotidine (PEPCID) 20 MG tablet Take 20 mg by mouth daily as needed for heartburn or indigestion.    Marland Kitchen latanoprost (XALATAN) 0.005 % ophthalmic solution Place 1 drop into both eyes at bedtime.     . Naproxen Sodium (ALEVE PO) Take 1 tablet by mouth as needed (pain).     Earney Navy Bicarbonate (ZEGERID) 20-1100 MG CAPS capsule Take 1 capsule by mouth daily as needed.    Marland Kitchen VIAGRA 100 MG tablet TAKE 1/2 TO 1 TABLET EVERY 24 HOURS AS NEEDED  0  . topiramate (TOPAMAX) 50 MG tablet TAKE THREE TABLETS BY MOUTH ONCE DAILY 90 tablet 1   No facility-administered medications prior to visit.      Allergies:   Review  of patient's allergies indicates no known allergies.   Social History   Social History  . Marital status: Married    Spouse name: N/A  . Number of children: 3  . Years of education: BA   Occupational History  . retired    Social History Main Topics  . Smoking status: Never Smoker  . Smokeless tobacco: Never Used  . Alcohol use 1.2 - 2.4 oz/week    1 - 2 Standard drinks or equivalent, 1 - 2 Cans of beer per week     Comment: social - 1-2 beer per  week  . Drug use: No  . Sexual activity: Yes    Birth control/ protection: None   Other Topics Concern  . None   Social History Narrative   Patient drinks 2 cups of  caffeine daily.   Patient is right handed.     Family History:  The patient's family history includes CAD in his brother; Dementia in his father; Glaucoma in his mother; Berenice Primas' disease in his sister; Heart attack in his brother; Heart disease in his sister; Migraines in his sister.   ROS:   Please see the history of present illness.    ROS All other systems reviewed and are negative.   PHYSICAL EXAM:   VS:  BP 124/80   Pulse 62   Ht 6\' 2"  (1.88 m)   Wt 240 lb (108.9 kg)   BMI 30.81 kg/m    GEN: Well nourished, well developed, in no acute distress  HEENT: normal  Neck: no JVD, carotid bruits, or masses Cardiac: RRR; no murmurs, rubs, or gallops,no edema  Respiratory:  clear to auscultation bilaterally, normal work of breathing GI: soft, nontender, nondistended, + BS MS: no deformity or atrophy  Skin: warm and dry, no rash Neuro:  Alert and Oriented x 3, Strength and sensation are intact Psych: euthymic mood, full affect  Wt Readings from Last 3 Encounters:  03/08/16 240 lb (108.9 kg)  02/03/16 233 lb (105.7 kg)  09/29/15 239 lb (108.4 kg)      Studies/Labs Reviewed:   EKG:  EKG is ordered today.  The ekg ordered today demonstrates NSR with no abnormalities.   Recent Labs: No results found for requested labs within last 8760 hours.   Lipid Panel No results found for: CHOL, TRIG, HDL, CHOLHDL, VLDL, LDLCALC, LDLDIRECT  Additional studies/ records that were reviewed today include:   Prior medical records reviewed, office notes reviewed,    ASSESSMENT:    1. Palpitations   2. Chest discomfort      PLAN:  In order of problems listed above:  Palpitations  - By description they sound like PVCs or PACs. We will check a 24-hour Holter monitor to ensure that he is not in any dangerous arrhythmias. There were no ectopic beats on EKG today or previously. All he was sitting and talking to me, he did feel a skip. I was unable to capture this by auscultation. He does  state that this probably happens at least 100 times a day. Hopefully we will go ahead and capture this with a 24-hour Holter. Also expressed that he should avoid caffeine. They're currently drinking two thirds decaf and one third caffeinated coffee. Try to eliminate caffeine altogether. Stress reduction can help as well. Exercise.  - We will also check an echocardiogram to ensure proper structure and function of his heart.  - He has been under increased stress with his GIST. This may be contributing as well.  Atypical  chest pain  - Mostly associated with symptom of palpitation, epigastric region. Described as mild. His brother had a heart attack unexpectedly. He is going to be hunting soon. I will check an exercise treadmill test to ensure proper function.      Medication Adjustments/Labs and Tests Ordered: Current medicines are reviewed at length with the patient today.  Concerns regarding medicines are outlined above.  Medication changes, Labs and Tests ordered today are listed in the Patient Instructions below. Patient Instructions  Medication Instructions:  The current medical regimen is effective;  continue present plan and medications.  Testing/Procedures: Your physician has requested that you have an echocardiogram. Echocardiography is a painless test that uses sound waves to create images of your heart. It provides your doctor with information about the size and shape of your heart and how well your heart's chambers and valves are working. This procedure takes approximately one hour. There are no restrictions for this procedure.  Your physician has requested that you have an exercise tolerance test. For further information please visit [email or web address][employer][email or web address]. Please also follow instruction sheet, as given.  Your physician has recommended that you wear a holter monitor for 24 hours. Holter monitors are medical devices that record the heart's electrical activity. Doctors most often use these  monitors to diagnose arrhythmias. Arrhythmias are problems with the speed or rhythm of the heartbeat. The monitor is a small, portable device. You can wear one while you do your normal daily activities. This is usually used to diagnose what is causing palpitations/syncope (passing out).  Follow-Up: Please follow up as needed after the above testing.  If you need a refill on your cardiac medications before your next appointment, please call your pharmacy.  Thank you for choosing [facility]!!         Signed, Candee Furbish, MD  03/08/2016 5:40 PM    [facility] [address], [place], [place]  [address] Phone: [phone]; Fax: [phone]

## 2016-03-21 ENCOUNTER — Encounter: Payer: Self-pay | Admitting: Cardiology

## 2016-03-23 ENCOUNTER — Other Ambulatory Visit: Payer: Self-pay

## 2016-03-23 ENCOUNTER — Ambulatory Visit (INDEPENDENT_AMBULATORY_CARE_PROVIDER_SITE_OTHER): Payer: Medicare Other

## 2016-03-23 ENCOUNTER — Ambulatory Visit (HOSPITAL_COMMUNITY): Payer: Medicare Other | Attending: Cardiology

## 2016-03-23 DIAGNOSIS — R002 Palpitations: Secondary | ICD-10-CM

## 2016-03-23 DIAGNOSIS — R0789 Other chest pain: Secondary | ICD-10-CM

## 2016-03-23 LAB — EXERCISE TOLERANCE TEST
Estimated workload: 10.1 METS
Exercise duration (min): 8 min
Exercise duration (sec): 0 s
MPHR: 151 {beats}/min
Peak HR: 131 {beats}/min
Percent HR: 86 %
RPE: 17
Rest HR: 59 {beats}/min

## 2016-05-04 DIAGNOSIS — H401122 Primary open-angle glaucoma, left eye, moderate stage: Secondary | ICD-10-CM | POA: Diagnosis not present

## 2016-05-04 DIAGNOSIS — H401111 Primary open-angle glaucoma, right eye, mild stage: Secondary | ICD-10-CM | POA: Diagnosis not present

## 2016-05-04 DIAGNOSIS — H2513 Age-related nuclear cataract, bilateral: Secondary | ICD-10-CM | POA: Diagnosis not present

## 2016-05-08 DIAGNOSIS — Z23 Encounter for immunization: Secondary | ICD-10-CM | POA: Diagnosis not present

## 2016-06-29 DIAGNOSIS — J069 Acute upper respiratory infection, unspecified: Secondary | ICD-10-CM | POA: Diagnosis not present

## 2016-07-31 ENCOUNTER — Other Ambulatory Visit: Payer: Self-pay | Admitting: Neurology

## 2016-09-05 DIAGNOSIS — H401123 Primary open-angle glaucoma, left eye, severe stage: Secondary | ICD-10-CM | POA: Diagnosis not present

## 2016-09-05 DIAGNOSIS — H401111 Primary open-angle glaucoma, right eye, mild stage: Secondary | ICD-10-CM | POA: Diagnosis not present

## 2016-09-05 DIAGNOSIS — H2513 Age-related nuclear cataract, bilateral: Secondary | ICD-10-CM | POA: Diagnosis not present

## 2016-10-10 ENCOUNTER — Ambulatory Visit (INDEPENDENT_AMBULATORY_CARE_PROVIDER_SITE_OTHER): Payer: Medicare Other | Admitting: Neurology

## 2016-10-10 ENCOUNTER — Encounter: Payer: Self-pay | Admitting: Neurology

## 2016-10-10 VITALS — BP 137/74 | HR 66 | Ht 74.0 in | Wt 243.0 lb

## 2016-10-10 DIAGNOSIS — R519 Headache, unspecified: Secondary | ICD-10-CM

## 2016-10-10 DIAGNOSIS — R51 Headache: Secondary | ICD-10-CM

## 2016-10-10 NOTE — Progress Notes (Signed)
Reason for visit: Headache  Leonard Williams is an 71 y.o. male  History of present illness:  Leonard Williams is a 71 year old right-handed white male with a history of migraine headaches. The patient has had an excellent response to Topamax, he has been able to reduce the dose from 150 mg at night to 50 mg at night and still get good benefit. He essentially does not have any headaches at this time. He denies any significant side effects on the medication. He returns for an evaluation.   Past Medical History:  Diagnosis Date  . Arthritis    mild arthritis- neck shoulders, more right shoulder-no problems at present.  . Cancer [facility])    GI tumor- stomach tumor "nonmalignant" last check 1 yr ago.  . Gastric mass    hx gastric mass- benign  . Glaucoma   . Glaucoma   . Glaucoma   . Headache disorder 03/08/2015   Topamax taken daily-very low grade headache to none now.  . History of colonic diverticulitis    left  . History of hiatal hernia    omeprazole as needed- "mild"  . 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)     Past Surgical History:  Procedure Laterality Date  . COLONOSCOPY N/A 01/21/2015   Procedure: COLONOSCOPY;  Surgeon: Garlan Fair, MD;  [facility];  Service: Endoscopy;  Laterality: N/A;-polyp removed in past-benign.  . ESOPHAGOGASTRODUODENOSCOPY (EGD) WITH PROPOFOL N/A 01/21/2015   Procedure: ESOPHAGOGASTRODUODENOSCOPY (EGD) WITH PROPOFOL;  Surgeon: Garlan Fair, MD;  [facility];  Service: Endoscopy;  Laterality: N/A;  . EUS N/A 02/04/2015   Procedure: UPPER ENDOSCOPIC ULTRASOUND (EUS) LINEAR;  Surgeon: Milus Banister, MD;  [facility];  Service: Endoscopy;  Laterality: N/A;  . EUS N/A 02/03/2016   Procedure: UPPER ENDOSCOPIC ULTRASOUND (EUS) RADIAL;  Surgeon: Milus Banister, MD;  [facility];  Service: Endoscopy;  Laterality: N/A;  . EYE SURGERY     laser surgery for glaucoma  . HERNIA REPAIR Left    groin  . TONSILLECTOMY       Family History  Problem Relation Age of Onset  . Glaucoma Mother   . Dementia Father   . Graves' disease Sister   . Heart disease Sister   . Migraines Sister   . Heart attack Brother   . CAD Brother     Social history:  reports that he has never smoked. He has never used smokeless tobacco. He reports that he drinks about 1.2 - 2.4 oz of alcohol per week . He reports that he does not use drugs.   No Known Allergies  Medications:  Prior to Admission medications   Medication Sig Start Date End Date Taking? Authorizing Provider  DORZOLAMIDE HCL-TIMOLOL MAL OP Place 1 drop into both eyes 2 (two) times daily.   Yes Historical Provider, MD  latanoprost (XALATAN) 0.005 % ophthalmic solution Place 1 drop into both eyes at bedtime.    Yes Historical Provider, MD  Naproxen Sodium (ALEVE PO) Take 1 tablet by mouth as needed (pain).    Yes Historical Provider, MD  topiramate (TOPAMAX) 50 MG tablet TAKE THREE TABLETS BY MOUTH ONCE DAILY 08/01/16  Yes Kathrynn Ducking, MD  VIAGRA 100 MG tablet TAKE 1/2 TO 1 TABLET EVERY 24 HOURS AS NEEDED 08/09/15  Yes Historical Provider, MD    ROS:  Out of a complete 14 system review of symptoms, the patient complains only of the following symptoms, and all other reviewed systems are  negative.  Ringing in the ears Cough Achy muscles Headache  Blood pressure 137/74, pulse 66, height 6\' 2"  (1.88 m), weight 243 lb (110.2 kg).  Physical Exam  General: The patient is alert and cooperative at the time of the examination.  Skin: No significant peripheral edema is noted.   Neurologic Exam  Mental status: The patient is alert and oriented x 3 at the time of the examination. The patient has apparent normal recent and remote memory, with an apparently normal attention span and concentration ability.   Cranial nerves: Facial symmetry is present. Speech is normal, no aphasia or dysarthria is noted. Extraocular movements are full. Visual fields are  full.  Motor: The patient has good strength in all 4 extremities.  Sensory examination: Soft touch sensation is symmetric on the face, arms, and legs.  Coordination: The patient has good finger-nose-finger and heel-to-shin bilaterally.  Gait and station: The patient has a normal gait. Tandem gait is normal. Romberg is negative. No drift is seen.  Reflexes: Deep tendon reflexes are symmetric.   Assessment/Plan:  1. Migraine headache  The patient is doing quite well at this time, he will be continued on the Topamax at 50 mg at night, he will call our office if any new issues arise. Otherwise, he will follow-up in one year.  Jill Alexanders MD 10/10/2016 10:24 AM  [facility] [address] [place], [place] [address]  Phone [phone] Fax [phone]

## 2017-01-30 DIAGNOSIS — H2513 Age-related nuclear cataract, bilateral: Secondary | ICD-10-CM | POA: Diagnosis not present

## 2017-01-30 DIAGNOSIS — H401111 Primary open-angle glaucoma, right eye, mild stage: Secondary | ICD-10-CM | POA: Diagnosis not present

## 2017-01-30 DIAGNOSIS — H401122 Primary open-angle glaucoma, left eye, moderate stage: Secondary | ICD-10-CM | POA: Diagnosis not present

## 2017-02-20 DIAGNOSIS — H401111 Primary open-angle glaucoma, right eye, mild stage: Secondary | ICD-10-CM | POA: Diagnosis not present

## 2017-02-20 DIAGNOSIS — H401122 Primary open-angle glaucoma, left eye, moderate stage: Secondary | ICD-10-CM | POA: Diagnosis not present

## 2017-03-07 ENCOUNTER — Other Ambulatory Visit: Payer: Self-pay | Admitting: Internal Medicine

## 2017-03-07 ENCOUNTER — Telehealth: Payer: Self-pay | Admitting: Cardiology

## 2017-03-07 DIAGNOSIS — K9049 Malabsorption due to intolerance, not elsewhere classified: Secondary | ICD-10-CM

## 2017-03-07 DIAGNOSIS — Z0001 Encounter for general adult medical examination with abnormal findings: Secondary | ICD-10-CM | POA: Diagnosis not present

## 2017-03-07 DIAGNOSIS — R002 Palpitations: Secondary | ICD-10-CM | POA: Diagnosis not present

## 2017-03-07 DIAGNOSIS — G43009 Migraine without aura, not intractable, without status migrainosus: Secondary | ICD-10-CM | POA: Diagnosis not present

## 2017-03-07 DIAGNOSIS — Z1389 Encounter for screening for other disorder: Secondary | ICD-10-CM | POA: Diagnosis not present

## 2017-03-07 NOTE — Telephone Encounter (Signed)
New message      Pt was seen by Dr Inda Merlin today.  They are faxing over ekg and ov notes.  Appt was scheduled for 04-10-17 with Truitt Merle (next available appt).  Calling to see if we will look at ekg and see if pt needs to be seen sooner.  Dr Inda Merlin pt needs to be seen sooner but want [place] to look at ekg.  Pt has afib and hr 78.

## 2017-03-07 NOTE — Telephone Encounter (Signed)
Spoke with pt's wife and pt has not felt well for some time since early spring pt lost his father in May and has had stress with this.Appointment made with Estella Husk Pac for  03-13-17 at 11:15 ./cy

## 2017-03-07 NOTE — Telephone Encounter (Signed)
Lm that have not received  ekg or office note at this time ./cy

## 2017-03-08 NOTE — Telephone Encounter (Signed)
Thanks for update °Mark Skains, MD ° °

## 2017-03-09 ENCOUNTER — Ambulatory Visit
Admission: RE | Admit: 2017-03-09 | Discharge: 2017-03-09 | Disposition: A | Discharge status: Home / Self Care | Payer: Medicare Other | Source: Ambulatory Visit | Attending: Internal Medicine | Admitting: Internal Medicine

## 2017-03-09 DIAGNOSIS — K760 Fatty (change of) liver, not elsewhere classified: Secondary | ICD-10-CM | POA: Diagnosis not present

## 2017-03-09 DIAGNOSIS — K9049 Malabsorption due to intolerance, not elsewhere classified: Secondary | ICD-10-CM

## 2017-03-13 ENCOUNTER — Encounter: Payer: Self-pay | Admitting: Physician Assistant

## 2017-03-13 ENCOUNTER — Encounter (INDEPENDENT_AMBULATORY_CARE_PROVIDER_SITE_OTHER): Payer: Self-pay

## 2017-03-13 ENCOUNTER — Ambulatory Visit (INDEPENDENT_AMBULATORY_CARE_PROVIDER_SITE_OTHER): Payer: Medicare Other | Admitting: Physician Assistant

## 2017-03-13 VITALS — BP 120/78 | HR 80 | Ht 74.0 in | Wt 236.8 lb

## 2017-03-13 DIAGNOSIS — R7303 Prediabetes: Secondary | ICD-10-CM

## 2017-03-13 DIAGNOSIS — Z01812 Encounter for preprocedural laboratory examination: Secondary | ICD-10-CM

## 2017-03-13 DIAGNOSIS — D214 Benign neoplasm of connective and other soft tissue of abdomen: Secondary | ICD-10-CM | POA: Insufficient documentation

## 2017-03-13 DIAGNOSIS — R0683 Snoring: Secondary | ICD-10-CM | POA: Diagnosis not present

## 2017-03-13 DIAGNOSIS — I4819 Other persistent atrial fibrillation: Secondary | ICD-10-CM

## 2017-03-13 DIAGNOSIS — I481 Persistent atrial fibrillation: Secondary | ICD-10-CM | POA: Diagnosis not present

## 2017-03-13 DIAGNOSIS — H409 Unspecified glaucoma: Secondary | ICD-10-CM | POA: Insufficient documentation

## 2017-03-13 NOTE — Progress Notes (Signed)
Cardiology Office Note    Date:  03/13/2017   ID:  Leonard Williams, DOB 08-02-1945, MRN [id number]  PCP:  [name], MD  Cardiologist: Dr. Marlou Porch  Chief Complaint  Patient presents with  . Atrial Fibrillation    History of Present Illness:   Leonard Williams is a 71 y.o. male who is being seen today for the evaluation of atrial fibrillation at the request of Dr. [name].  He has a  history of normal nuclear stress test in 2005 was seen by Dr. Luther Parody and 02/2016 for chest pain and palpitations. His brother has CAD Sr. had an ablation and daughters had dropped by about 1 weeks because of arrhythmia. Normal GXT 03/23/16, 2-D echo LVEF 55% with grade 1 DD. 24 hour monitor showed frequent PVCs 700 occasional PACs 70 brief PAT 7 beats, and no atrial fibrillation no positives average heart rate 65 bpm. Dr. Marlou Porch recommended conservative management but would consider beta blocker.  Patient saw Dr. Inda Merlin 03/07/17 for a wellness visit and screening EKG showed atrial fibrillation at 78 bpm. He was started on Xarelto 20 mg daily. TSH CBC and CMET were all stable. LDL 111. Hemoglobin A1c was 5.8. He's lost 15 lbs in 2 weeks.  Patient comes in today accompanied by his wife. He is asymptomatic with the afib. He hasn'tHad any fast heart rates that he is aware of. He is walking 3 miles a day trying to lose weight. When he was told he was prediabetic he decided he was going lose 50 pounds. CHADSVASC=1 for age, 2 if considering DM. He denies any chest pain, palpitations, dyspnea, dyspnea on exertion, dizziness or presyncope. He has also been borderline for sleep apnea on 2 prior test but hasn't been tested in over 5 years. He'd like to be tested for this again.  Past Medical History:  Diagnosis Date  . Arthritis    mild arthritis- neck shoulders, more right shoulder-no problems at present.  . Cancer [facility])    GI tumor- stomach tumor "nonmalignant" last check 1 yr ago.  . Gastric mass    hx  gastric mass- benign  . Glaucoma   . Glaucoma   . Glaucoma   . Headache disorder 03/08/2015   Topamax taken daily-very low grade headache to none now.  . History of colonic diverticulitis    left  . History of hiatal hernia    omeprazole as needed- "mild"  . [name] pupil [facility])     Past Surgical History:  Procedure Laterality Date  . COLONOSCOPY N/A 01/21/2015   Procedure: COLONOSCOPY;  Surgeon: Garlan Fair, MD;  Location: [facility];  Service: Endoscopy;  Laterality: N/A;-polyp removed in past-benign.  . ESOPHAGOGASTRODUODENOSCOPY (EGD) WITH PROPOFOL N/A 01/21/2015   Procedure: ESOPHAGOGASTRODUODENOSCOPY (EGD) WITH PROPOFOL;  Surgeon: Garlan Fair, MD;  Location: [facility];  Service: Endoscopy;  Laterality: N/A;  . EUS N/A 02/04/2015   Procedure: UPPER ENDOSCOPIC ULTRASOUND (EUS) LINEAR;  Surgeon: Milus Banister, MD;  Location: [facility];  Service: Endoscopy;  Laterality: N/A;  . EUS N/A 02/03/2016   Procedure: UPPER ENDOSCOPIC ULTRASOUND (EUS) RADIAL;  Surgeon: Milus Banister, MD;  Location: [facility];  Service: Endoscopy;  Laterality: N/A;  . EYE SURGERY     laser surgery for glaucoma  . HERNIA REPAIR Left    groin  . TONSILLECTOMY      Current Medications: Current Meds  Medication Sig  . DORZOLAMIDE HCL-TIMOLOL MAL OP Place 1 drop into both eyes 2 (  two) times daily.  Marland Kitchen latanoprost (XALATAN) 0.005 % ophthalmic solution Place 1 drop into both eyes at bedtime.   . Naproxen Sodium (ALEVE PO) Take 1 tablet by mouth as needed (pain).   . RHOPRESSA 0.02 % SOLN Place 1 drop into the left eye daily.  . rivaroxaban (XARELTO) 20 MG TABS tablet Take 20 mg by mouth daily with supper.  . topiramate (TOPAMAX) 50 MG tablet TAKE THREE TABLETS BY MOUTH ONCE DAILY  . VIAGRA 100 MG tablet TAKE 1/2 TO 1 TABLET EVERY 24 HOURS AS NEEDED     Allergies:   Patient has no known allergies.   Social History   Social History  . Marital status: Married    Spouse name: N/A    . Number of children: 3  . Years of education: BA   Occupational History  . retired    Social History Main Topics  . Smoking status: Never Smoker  . Smokeless tobacco: Never Used  . Alcohol use 1.2 - 2.4 oz/week    1 - 2 Standard drinks or equivalent, 1 - 2 Cans of beer per week     Comment: social - 1-2 beer per  week  . Drug use: No  . Sexual activity: Yes    Birth control/ protection: None   Other Topics Concern  . None   Social History Narrative   Patient drinks 2 cups of caffeine daily.   Patient is right handed.     Family History:  The patient's family history includes CAD in his brother; Dementia in his father; Glaucoma in his mother; Berenice Primas' disease in his sister; Heart attack in his brother; Heart disease in his sister; Migraines in his sister.   ROS:   Please see the history of present illness.    Review of Systems  Constitution: Negative.  HENT: Negative.   Eyes: Positive for visual disturbance.       Glaucoma  Cardiovascular: Negative.   Respiratory: Negative.   Endocrine: Negative.   Hematologic/Lymphatic: Negative.   Musculoskeletal: Negative.   Gastrointestinal: Negative.   Genitourinary: Negative.   Neurological: Negative.    All other systems reviewed and are negative.   PHYSICAL EXAM:   VS:  BP 120/78   Pulse 80   Ht 6\' 2"  (1.88 m)   Wt 236 lb 12.8 oz (107.4 kg)   SpO2 97%   BMI 30.40 kg/m   Physical Exam  GEN: Well nourished, well developed, in no acute distress  Neck: no JVD, carotid bruits, or masses Cardiac:RRR; no murmurs, rubs, or gallops  Respiratory:  clear to auscultation bilaterally, normal work of breathing GI: soft, nontender, nondistended, + BS Ext: without cyanosis, clubbing, or edema, Good distal pulses bilaterally Neuro:  Alert and Oriented x 3 Psych: euthymic mood, full affect  Wt Readings from Last 3 Encounters:  03/13/17 236 lb 12.8 oz (107.4 kg)  10/10/16 243 lb (110.2 kg)  03/08/16 240 lb (108.9 kg)       Studies/Labs Reviewed:   EKG:  EKG is ordered today.  The ekg ordered today demonstrates Atrial fibrillation at 80 bpm left anterior fascicular block  Recent Labs: No results found for requested labs within last 8760 hours.   Lipid Panel No results found for: CHOL, TRIG, HDL, CHOLHDL, VLDL, LDLCALC, LDLDIRECT  Additional studies/ records that were reviewed today include:   Holter monitor 9/14/17Frequent PVC's 700  Occasional PAC's 70  Brief paroxsysmal atrial tachycardia (PAT) 7 beats  No atrial fibrillation  No pauses  Avg  HR 65bpm   Reassuring monitor, palpitations correlate with PVC's/ PAC's. EF normal.  Could consider beta blocker but would prefer conservative management.  Candee Furbish, MD  2-D echo 9/14/17Study Conclusions   - Left ventricle: The cavity size was normal. Wall thickness was   normal. The estimated ejection fraction was 55%. Wall motion was   normal; there were no regional wall motion abnormalities. Doppler   parameters are consistent with abnormal left ventricular   relaxation (grade 1 diastolic dysfunction). - Aortic valve: There was no stenosis. - Mitral valve: There was trivial regurgitation. - Right ventricle: The cavity size was normal. Systolic function   was normal. - Pulmonary arteries: No complete TR doppler jet so unable to   estimate PA systolic pressure. - Systemic veins: IVC measured 2.1 cm with > 50% respirophasic   variation, suggesting RA pressure 8 mmHg.   Impressions:   - Normal LV size with EF 55%. Normal RV size and systolic function.   No significant valvular abnormalities.    GXT 9/14/17Blood pressure demonstrated a normal response to exercise.  There was no ST segment deviation noted during stress.  No T wave inversion was noted during stress.   Normal ECG stress test.  ASSESSMENT:    1. Persistent atrial fibrillation ([place])   2. Pre-procedure lab exam   3. Snoring   4. Prediabetes      PLAN:  In order  of problems listed above:   Atrial fibrillation question duration. Patient asymptomatic. Patient's rate is controlled without any rate lowering medication. CHADSVASC=1 for age, 2 if counting prediabetes. 2-D echo a year ago showed normal systolic function. Discussed this patient detail with Dr. Radford Pax. Continue Xarelto. Plan 2-D echo then cardioversion in 4 weeks. Follow-up with Dr. Marlou Porch after cardioversion who can decide whether or not Xarelto needs to be continued long-term.  Preprocedure labs being drawn before his cardioversion  Snoring with borderline sleep apnea test in the past. Patient would like to have this repeated. We'll order.  Prediabetes not on any medication and has already lost 16 pounds with plants to lose more  Medication Adjustments/Labs and Tests Ordered: Current medicines are reviewed at length with the patient today.  Concerns regarding medicines are outlined above.  Medication changes, Labs and Tests ordered today are listed in the Patient Instructions below. Patient Instructions  Medication Instructions: - Your physician recommends that you continue on your current medications as directed. Please refer to the Current Medication list given to you today.  Labwork: - Your physician recommends that you return for lab work on [employer]. April 04, 2017 : BMET and CBC  Procedures/Testing: - Your physician has recommended that you have a sleep study. This test records several body functions during sleep, including: brain activity, eye movement, oxygen and carbon dioxide blood levels, heart rate and rhythm, breathing rate and rhythm, the flow of air through your mouth and nose, snoring, body muscle movements, and chest and belly movement.  -Your physician has recommended that you have a Cardioversion (DCCV) - Tuesday April 10, 2017 at 12:00 Noon. Electrical Cardioversion uses a jolt of electricity to your heart either through paddles or wired patches attached to your chest.  This is a controlled, usually prescheduled, procedure. Defibrillation is done under light anesthesia in the hospital, and you usually go home the day of the procedure. This is done to get your heart back into a normal rhythm. You are not awake for the procedure. Please see the instruction sheet given to you today.  -  Your physician has requested that you have an echocardiogram (after Cardioversion) . Echocardiography is a painless test that uses sound waves to create images of your heart. It provides your doctor with information about the size and shape of your heart and how well your heart's chambers and valves are working. This procedure takes approximately one hour. There are no restrictions for this procedure.    Follow-Up: - Your physician recommends that you schedule a follow-up appointment in after (Oct 2)your Cardioversion with Dr. Marlou Porch   If you need a refill on your cardiac medications before your next appointment, please call your pharmacy.      Sumner Boast, PA-C  03/13/2017 3:53 PM    [facility] [place], [place],   [address] Phone: [phone]; Fax: [phone]

## 2017-03-13 NOTE — Patient Instructions (Addendum)
Medication Instructions: - Your physician recommends that you continue on your current medications as directed. Please refer to the Current Medication list given to you today.  Labwork: - Your physician recommends that you return for lab work on [employer]. April 04, 2017 : BMET and CBC  Procedures/Testing: - Your physician has recommended that you have a sleep study. This test records several body functions during sleep, including: brain activity, eye movement, oxygen and carbon dioxide blood levels, heart rate and rhythm, breathing rate and rhythm, the flow of air through your mouth and nose, snoring, body muscle movements, and chest and belly movement.  -Your physician has recommended that you have a Cardioversion (DCCV) - Tuesday April 10, 2017 at 12:00 Noon. Electrical Cardioversion uses a jolt of electricity to your heart either through paddles or wired patches attached to your chest. This is a controlled, usually prescheduled, procedure. Defibrillation is done under light anesthesia in the hospital, and you usually go home the day of the procedure. This is done to get your heart back into a normal rhythm. You are not awake for the procedure. Please see the instruction sheet given to you today.  -Your physician has requested that you have an echocardiogram (after Cardioversion) . Echocardiography is a painless test that uses sound waves to create images of your heart. It provides your doctor with information about the size and shape of your heart and how well your heart's chambers and valves are working. This procedure takes approximately one hour. There are no restrictions for this procedure.    Follow-Up: - Your physician recommends that you schedule a follow-up appointment in after (Oct 2)your Cardioversion with Dr. Marlou Porch   If you need a refill on your cardiac medications before your next appointment, please call your pharmacy.

## 2017-03-14 ENCOUNTER — Telehealth: Payer: Self-pay | Admitting: *Deleted

## 2017-03-14 NOTE — Telephone Encounter (Signed)
-----   Message from [facility] [place], [place] sent at 03/13/2017 11:50 AM EDT ----- Regarding: Sleep study Pt needs a sleep study for snoring and to rule out sleep apnea per Devonne Doughty, PA-C

## 2017-03-14 NOTE — Telephone Encounter (Signed)
Informed patient of upcoming sleep study and patient understanding was verbalized. Patient understands his sleep study is scheduled for Tuesday May 15 2017. Patient understands his sleep study will be done at [facility] sleep lab. Patient understands he will receive a sleep packet in a week or so. Patient understands to call if he does not receive the sleep packet in a timely manner. Patient agrees with treatment and thanked me for call.

## 2017-03-15 ENCOUNTER — Other Ambulatory Visit: Payer: Self-pay | Admitting: *Deleted

## 2017-03-15 MED ORDER — RIVAROXABAN 20 MG PO TABS: 20.0000 mg | ORAL_TABLET | Freq: Every day | ORAL | 1 refills | Status: DC

## 2017-04-04 ENCOUNTER — Other Ambulatory Visit: Payer: Medicare Other | Admitting: *Deleted

## 2017-04-04 DIAGNOSIS — I4819 Other persistent atrial fibrillation: Secondary | ICD-10-CM

## 2017-04-04 DIAGNOSIS — Z01812 Encounter for preprocedural laboratory examination: Secondary | ICD-10-CM

## 2017-04-04 DIAGNOSIS — I481 Persistent atrial fibrillation: Secondary | ICD-10-CM | POA: Diagnosis not present

## 2017-04-04 LAB — CBC WITH DIFFERENTIAL/PLATELET
Basophils Absolute: 0 10*3/uL (ref 0.0–0.2)
Basos: 0 %
EOS (ABSOLUTE): 0.1 10*3/uL (ref 0.0–0.4)
Eos: 2 %
Hematocrit: 42.8 % (ref 37.5–51.0)
Hemoglobin: 14.9 g/dL (ref 13.0–17.7)
Immature Grans (Abs): 0 10*3/uL (ref 0.0–0.1)
Immature Granulocytes: 0 %
Lymphocytes Absolute: 1.5 10*3/uL (ref 0.7–3.1)
Lymphs: 34 %
MCH: 30.1 pg (ref 26.6–33.0)
MCHC: 34.8 g/dL (ref 31.5–35.7)
MCV: 87 fL (ref 79–97)
Monocytes Absolute: 0.5 10*3/uL (ref 0.1–0.9)
Monocytes: 12 %
Neutrophils Absolute: 2.3 10*3/uL (ref 1.4–7.0)
Neutrophils: 52 %
Platelets: 221 10*3/uL (ref 150–379)
RBC: 4.95 x10E6/uL (ref 4.14–5.80)
RDW: 13.1 % (ref 12.3–15.4)
WBC: 4.4 10*3/uL (ref 3.4–10.8)

## 2017-04-04 LAB — BASIC METABOLIC PANEL
BUN/Creatinine Ratio: 12 (ref 10–24)
BUN: 14 mg/dL (ref 8–27)
CO2: 20 mmol/L (ref 20–29)
Calcium: 9.1 mg/dL (ref 8.6–10.2)
Chloride: 104 mmol/L (ref 96–106)
Creatinine, Ser: 1.13 mg/dL (ref 0.76–1.27)
GFR calc Af Amer: 76 mL/min/{1.73_m2} (ref >59)
GFR calc non Af Amer: 65 mL/min/{1.73_m2} (ref >59)
Glucose: 97 mg/dL (ref 65–99)
Potassium: 4.8 mmol/L (ref 3.5–5.2)
Sodium: 141 mmol/L (ref 134–144)

## 2017-04-09 ENCOUNTER — Telehealth: Payer: Self-pay | Admitting: Cardiology

## 2017-04-09 NOTE — Telephone Encounter (Signed)
Looks like this is scheduled for a Cardioversion  on Tuesday April 10, 2017.  Please arrive at [facility] at 10:00 a.m.  on the day of your procedure. Reviewed letter directions with pt and arrival times since he states that he has not received letter. Pt will arrive at [facility] at 10am then have ECHO 04-11-17 at [facility]. Pt is aware of these schedule procedures and times.

## 2017-04-09 NOTE — Telephone Encounter (Signed)
New message     Patient states he is scheduled for procedure 10/2. Patient request details on procedure, prep and location.

## 2017-04-10 ENCOUNTER — Encounter (HOSPITAL_COMMUNITY): Payer: Self-pay | Admitting: Anesthesiology

## 2017-04-10 ENCOUNTER — Ambulatory Visit (HOSPITAL_COMMUNITY)
Admission: RE | Admit: 2017-04-10 | Discharge: 2017-04-10 | Disposition: A | Discharge status: Home / Self Care | Payer: Medicare Other | Source: Ambulatory Visit | Attending: Cardiology | Admitting: Cardiology

## 2017-04-10 ENCOUNTER — Encounter (HOSPITAL_COMMUNITY)
Admission: RE | Disposition: A | Discharge status: Home / Self Care | Payer: Self-pay | Source: Ambulatory Visit | Attending: Cardiology

## 2017-04-10 ENCOUNTER — Ambulatory Visit (HOSPITAL_COMMUNITY): Payer: Medicare Other | Admitting: Anesthesiology

## 2017-04-10 ENCOUNTER — Ambulatory Visit: Payer: Medicare Other | Admitting: Nurse Practitioner

## 2017-04-10 DIAGNOSIS — R7303 Prediabetes: Secondary | ICD-10-CM | POA: Insufficient documentation

## 2017-04-10 DIAGNOSIS — D490 Neoplasm of unspecified behavior of digestive system: Secondary | ICD-10-CM | POA: Diagnosis not present

## 2017-04-10 DIAGNOSIS — K449 Diaphragmatic hernia without obstruction or gangrene: Secondary | ICD-10-CM | POA: Insufficient documentation

## 2017-04-10 DIAGNOSIS — M19012 Primary osteoarthritis, left shoulder: Secondary | ICD-10-CM | POA: Diagnosis not present

## 2017-04-10 DIAGNOSIS — I491 Atrial premature depolarization: Secondary | ICD-10-CM | POA: Diagnosis not present

## 2017-04-10 DIAGNOSIS — I481 Persistent atrial fibrillation: Secondary | ICD-10-CM | POA: Diagnosis not present

## 2017-04-10 DIAGNOSIS — Z820 Family history of epilepsy and other diseases of the nervous system: Secondary | ICD-10-CM | POA: Diagnosis not present

## 2017-04-10 DIAGNOSIS — M4692 Unspecified inflammatory spondylopathy, cervical region: Secondary | ICD-10-CM | POA: Insufficient documentation

## 2017-04-10 DIAGNOSIS — Q078 Other specified congenital malformations of nervous system: Secondary | ICD-10-CM | POA: Insufficient documentation

## 2017-04-10 DIAGNOSIS — I4819 Other persistent atrial fibrillation: Secondary | ICD-10-CM

## 2017-04-10 DIAGNOSIS — H409 Unspecified glaucoma: Secondary | ICD-10-CM | POA: Diagnosis not present

## 2017-04-10 DIAGNOSIS — Z8249 Family history of ischemic heart disease and other diseases of the circulatory system: Secondary | ICD-10-CM | POA: Diagnosis not present

## 2017-04-10 DIAGNOSIS — Z79899 Other long term (current) drug therapy: Secondary | ICD-10-CM | POA: Insufficient documentation

## 2017-04-10 DIAGNOSIS — Z7901 Long term (current) use of anticoagulants: Secondary | ICD-10-CM | POA: Diagnosis not present

## 2017-04-10 DIAGNOSIS — I493 Ventricular premature depolarization: Secondary | ICD-10-CM | POA: Insufficient documentation

## 2017-04-10 DIAGNOSIS — M19011 Primary osteoarthritis, right shoulder: Secondary | ICD-10-CM | POA: Insufficient documentation

## 2017-04-10 DIAGNOSIS — R51 Headache: Secondary | ICD-10-CM | POA: Diagnosis not present

## 2017-04-10 HISTORY — PX: CARDIOVERSION: SHX1299

## 2017-04-10 SURGERY — CARDIOVERSION
Anesthesia: General

## 2017-04-10 MED ORDER — PROPOFOL 10 MG/ML IV BOLUS
INTRAVENOUS | Status: DC | PRN
  Administered 2017-04-10: 100 mg via INTRAVENOUS

## 2017-04-10 MED ORDER — SODIUM CHLORIDE 0.9 % IV SOLN
250.0000 mL | INTRAVENOUS | Status: DC
  Administered 2017-04-10: 250 mL via INTRAVENOUS

## 2017-04-10 MED ORDER — SODIUM CHLORIDE 0.9% FLUSH: 3.0000 mL | Freq: Two times a day (BID) | INTRAVENOUS | Status: DC

## 2017-04-10 MED ORDER — HYDROCORTISONE 1 % EX CREA: 1.0000 "application " | TOPICAL_CREAM | Freq: Three times a day (TID) | CUTANEOUS | Status: DC | PRN

## 2017-04-10 MED ORDER — LIDOCAINE 2% (20 MG/ML) 5 ML SYRINGE
INTRAMUSCULAR | Status: DC | PRN
  Administered 2017-04-10: 80 mg via INTRAVENOUS

## 2017-04-10 MED ORDER — SODIUM CHLORIDE 0.9% FLUSH: 3.0000 mL | INTRAVENOUS | Status: DC | PRN

## 2017-04-10 NOTE — Procedures (Signed)
Electrical Cardioversion Procedure Note Leonard Williams [id number] [birth year]  Procedure: Electrical Cardioversion Indications:  Atrial Fibrillation  Procedure Details Consent: Risks of procedure as well as the alternatives and risks of each were explained to the (patient/caregiver).  Consent for procedure obtained. Time Out: Verified patient identification, verified procedure, site/side was marked, verified correct patient position, special equipment/implants available, medications/allergies/relevent history reviewed, required imaging and test results available.  Performed  Patient placed on cardiac monitor, pulse oximetry, supplemental oxygen as necessary.  Sedation given: Pt sedated by anesthesia with lidocaine 80 mg and diprovan 100 mg IV. Pacer pads placed anterior and posterior chest.  Cardioverted 1 time(s).  Cardioverted at 120J.  Evaluation Findings: Post procedure EKG shows: NSR Complications: None Patient did tolerate procedure well.   Kirk Ruths 04/10/2017, 10:19 AM

## 2017-04-10 NOTE — Transfer of Care (Signed)
Immediate Anesthesia Transfer of Care Note  Patient: Leonard Williams  Procedure(s) Performed: CARDIOVERSION (N/A )  Patient Location: Endoscopy Unit  Anesthesia Type:General  Level of Consciousness: awake, alert  and oriented  Airway & Oxygen Therapy: Patient Spontanous Breathing and Patient connected to nasal cannula oxygen  Post-op Assessment: Report given to RN, Post -op Vital signs reviewed and stable and Patient moving all extremities X 4  Post vital signs: Reviewed and stable  Last Vitals:  Vitals:   04/10/17 1030  BP: 140/83  Pulse: 77  Resp: 14  Temp: 36.6 C  SpO2: (!) 18%    Last Pain:  Vitals:   04/10/17 1030  TempSrc: Oral         Complications: No apparent anesthesia complications

## 2017-04-10 NOTE — Anesthesia Preprocedure Evaluation (Addendum)
Anesthesia Evaluation  Patient identified by MRN, date of birth, ID band Patient awake    Reviewed: Allergy & Precautions, NPO status , Patient's Chart, lab work & pertinent test results  Airway Mallampati: I  TM Distance: >3 FB Neck ROM: Full    Dental no notable dental hx. (+) Teeth Intact   Pulmonary    Pulmonary exam normal breath sounds clear to auscultation       Cardiovascular Normal cardiovascular exam Rhythm:Regular Rate:Normal     Neuro/Psych    GI/Hepatic   Endo/Other    Renal/GU      Musculoskeletal   Abdominal   Peds  Hematology   Anesthesia Other Findings   Reproductive/Obstetrics                            Anesthesia Physical Anesthesia Plan  ASA: II  Anesthesia Plan: MAC   Post-op Pain Management:    Induction: Intravenous  PONV Risk Score and Plan:   Airway Management Planned: Mask  Additional Equipment:   Intra-op Plan:   Post-operative Plan:   Informed Consent:   Dental advisory given  Plan Discussed with: Anesthesiologist and CRNA  Anesthesia Plan Comments:         Anesthesia Quick Evaluation

## 2017-04-10 NOTE — Anesthesia Procedure Notes (Signed)
Procedure Name: General with mask airway Date/Time: 04/10/2017 12:02 PM Performed by: Rush Farmer E Pre-anesthesia Checklist: Patient identified, Emergency Drugs available, Suction available, Patient being monitored and Timeout performed Patient Re-evaluated:Patient Re-evaluated prior to induction Oxygen Delivery Method: Ambu bag Preoxygenation: Pre-oxygenation with 100% oxygen Induction Type: IV induction Ventilation: Mask ventilation without difficulty Placement Confirmation: breath sounds checked- equal and bilateral Dental Injury: Teeth and Oropharynx as per pre-operative assessment

## 2017-04-10 NOTE — Anesthesia Postprocedure Evaluation (Signed)
Anesthesia Post Note  Patient: Leonard Williams  Procedure(s) Performed: CARDIOVERSION (N/A )     Patient location during evaluation: PACU Anesthesia Type: General Level of consciousness: awake and alert Pain management: pain level controlled Vital Signs Assessment: post-procedure vital signs reviewed and stable Respiratory status: spontaneous breathing, nonlabored ventilation and respiratory function stable Cardiovascular status: blood pressure returned to baseline and stable Postop Assessment: no apparent nausea or vomiting Anesthetic complications: no    Last Vitals:  Vitals:   04/10/17 1214 04/10/17 1220  BP: 104/62 (!) 103/57  Pulse: (!) 58 (!) 59  Resp: 13 17  Temp: 36.5 C   SpO2: 99% 100%    Last Pain:  Vitals:   04/10/17 1214  TempSrc: Oral                 Lynda Rainwater

## 2017-04-10 NOTE — Addendum Note (Signed)
Addendum  created 04/10/17 1245 by Kyung Rudd, CRNA   Anesthesia Intra Blocks edited, Anesthesia Intra Flowsheets edited, Child order released for a procedure order, Sign clinical note

## 2017-04-10 NOTE — H&P (Signed)
Office Visit   03/13/2017 [facility]  [place], [place]  Cardiology   Persistent atrial fibrillation [facility]) +3 more  Dx   Atrial Fibrillation; Referred by Garlan Fair, MD  Reason for Visit   Additional Documentation   Vitals:   BP 120/78   Pulse 80   Ht 6\' 2"  (1.88 m)   Wt 107.4 kg (236 lb 12.8 oz)   SpO2 97%   BMI 30.40 kg/m   BSA 2.37 m   Flowsheets:   Custom Formula Data,   MEWS Score,   Anthropometrics     Encounter Info:   Billing Info,   History,   Allergies,   Detailed Report     All Notes   Procedures by Imogene Burn, PA-C at 03/14/2017 11:21 AM   Author: Imogene Burn, PA-C Author Type: Physician Assistant Filed: 03/14/2017 11:21 AM  Note Status: Signed Cosign: Cosign Not Required Encounter Date: 03/13/2017  Editor: Sallee Provencal L        Scan on 03/14/2017 11:21 AM by Sallee Provencal L : Ekg - [facility]  Progress Notes by Imogene Burn, PA-C at 03/13/2017 11:15 AM   Author: Imogene Burn, PA-C Author Type: Physician Assistant Filed: 03/13/2017 3:56 PM  Note Status: Signed Cosign: Cosign Not Required Encounter Date: 03/13/2017  Editor: Murrell Converse (Physician Assistant)  Expand All Collapse All      Cardiology Office Note    Date:  03/13/2017   ID:  Leonard Williams, DOB 1946-07-01, MRN [id number]  PCP:  [name], MD        Cardiologist: Dr. Marlou Porch     Chief Complaint  Patient presents with  . Atrial Fibrillation    History of Present Illness:   Leonard Williams is a 71 y.o. male who is being seen today for the evaluation of atrial fibrillation at the request of Dr. [name].  He has a  history of normal nuclear stress test in 2005 was seen by Dr. Luther Parody and 02/2016 for chest pain and palpitations. His brother has CAD Sr. had an ablation and daughters had dropped by about 1 weeks because of arrhythmia. Normal GXT 03/23/16, 2-D echo LVEF 55% with grade 1 DD. 24 hour monitor  showed frequent PVCs 700 occasional PACs 70 brief PAT 7 beats, and no atrial fibrillation no positives average heart rate 65 bpm. Dr. Marlou Porch recommended conservative management but would consider beta blocker.  Patient saw Dr. Inda Merlin 03/07/17 for a wellness visit and screening EKG showed atrial fibrillation at 78 bpm. He was started on Xarelto 20 mg daily. TSH CBC and CMET were all stable. LDL 111. Hemoglobin A1c was 5.8. He's lost 15 lbs in 2 weeks.  Patient comes in today accompanied by his wife. He is asymptomatic with the afib. He hasn'tHad any fast heart rates that he is aware of. He is walking 3 miles a day trying to lose weight. When he was told he was prediabetic he decided he was going lose 50 pounds. CHADSVASC=1 for age, 2 if considering DM. He denies any chest pain, palpitations, dyspnea, dyspnea on exertion, dizziness or presyncope. He has also been borderline for sleep apnea on 2 prior test but hasn't been tested in over 5 years. He'd like to be tested for this again.      Past Medical History:  Diagnosis Date  . Arthritis    mild arthritis- neck shoulders, more right shoulder-no problems at present.  . Cancer (  [place])    GI tumor- stomach tumor "nonmalignant" last check 1 yr ago.  . Gastric mass    hx gastric mass- benign  . Glaucoma   . Glaucoma   . Glaucoma   . Headache disorder 03/08/2015   Topamax taken daily-very low grade headache to none now.  . History of colonic diverticulitis    left  . History of hiatal hernia    omeprazole as needed- "mild"  . [name] pupil [facility])          Past Surgical History:  Procedure Laterality Date  . COLONOSCOPY N/A 01/21/2015   Procedure: COLONOSCOPY;  Surgeon: Garlan Fair, MD;  Location: [facility];  Service: Endoscopy;  Laterality: N/A;-polyp removed in past-benign.  . ESOPHAGOGASTRODUODENOSCOPY (EGD) WITH PROPOFOL N/A 01/21/2015   Procedure: ESOPHAGOGASTRODUODENOSCOPY (EGD) WITH PROPOFOL;  Surgeon: Garlan Fair, MD;  Location: [facility];  Service: Endoscopy;  Laterality: N/A;  . EUS N/A 02/04/2015   Procedure: UPPER ENDOSCOPIC ULTRASOUND (EUS) LINEAR;  Surgeon: Milus Banister, MD;  Location: [facility]OS[facility];  Service: Endoscopy;  Laterality: N/A;  . EUS N/A 02/03/2016   Procedure: UPPER ENDOSCOPIC ULTRASOUND (EUS) RADIAL;  Surgeon: Milus Banister, MD;  Location: [facility];  Service: Endoscopy;  Laterality: N/A;  . EYE SURGERY     laser surgery for glaucoma  . HERNIA REPAIR Left    groin  . TONSILLECTOMY      Current Medications: Active Medications      Current Meds  Medication Sig  . DORZOLAMIDE HCL-TIMOLOL MAL OP Place 1 drop into both eyes 2 (two) times daily.  Marland Kitchen latanoprost (XALATAN) 0.005 % ophthalmic solution Place 1 drop into both eyes at bedtime.   . Naproxen Sodium (ALEVE PO) Take 1 tablet by mouth as needed (pain).   . RHOPRESSA 0.02 % SOLN Place 1 drop into the left eye daily.  . rivaroxaban (XARELTO) 20 MG TABS tablet Take 20 mg by mouth daily with supper.  . topiramate (TOPAMAX) 50 MG tablet TAKE THREE TABLETS BY MOUTH ONCE DAILY  . VIAGRA 100 MG tablet TAKE 1/2 TO 1 TABLET EVERY 24 HOURS AS NEEDED       Allergies:   Patient has no known allergies.   Social History        Social History  . Marital status: Married    Spouse name: N/A  . Number of children: 3  . Years of education: BA       Occupational History  . retired          Social History Main Topics  . Smoking status: Never Smoker  . Smokeless tobacco: Never Used  . Alcohol use 1.2 - 2.4 oz/week    1 - 2 Standard drinks or equivalent, 1 - 2 Cans of beer per week     Comment: social - 1-2 beer per  week  . Drug use: No  . Sexual activity: Yes    Birth control/ protection: None       Other Topics Concern  . None      Social History Narrative   Patient drinks 2 cups of caffeine daily.   Patient is right handed.     Family History:  The patient's family  history includes CAD in his brother; Dementia in his father; Glaucoma in his mother; Berenice Primas' disease in his sister; Heart attack in his brother; Heart disease in his sister; Migraines in his sister.   ROS:   Please see the history of present illness.  Review of Systems  Constitution: Negative.  HENT: Negative.   Eyes: Positive for visual disturbance.       Glaucoma  Cardiovascular: Negative.   Respiratory: Negative.   Endocrine: Negative.   Hematologic/Lymphatic: Negative.   Musculoskeletal: Negative.   Gastrointestinal: Negative.   Genitourinary: Negative.   Neurological: Negative.    All other systems reviewed and are negative.   PHYSICAL EXAM:   VS:  BP 120/78   Pulse 80   Ht 6\' 2"  (1.88 m)   Wt 236 lb 12.8 oz (107.4 kg)   SpO2 97%   BMI 30.40 kg/m   Physical Exam  GEN: Well nourished, well developed, in no acute distress  Neck: no JVD, carotid bruits, or masses Cardiac:RRR; no murmurs, rubs, or gallops  Respiratory:  clear to auscultation bilaterally, normal work of breathing GI: soft, nontender, nondistended, + BS Ext: without cyanosis, clubbing, or edema, Good distal pulses bilaterally Neuro:  Alert and Oriented x 3 Psych: euthymic mood, full affect     Wt Readings from Last 3 Encounters:  03/13/17 236 lb 12.8 oz (107.4 kg)  10/10/16 243 lb (110.2 kg)  03/08/16 240 lb (108.9 kg)      Studies/Labs Reviewed:   EKG:  EKG is ordered today.  The ekg ordered today demonstrates Atrial fibrillation at 80 bpm left anterior fascicular block  Recent Labs: No results found for requested labs within last 8760 hours.   Lipid Panel Labs (Brief)  No results found for: CHOL, TRIG, HDL, CHOLHDL, VLDL, LDLCALC, LDLDIRECT    Additional studies/ records that were reviewed today include:   Holter monitor 9/14/17Frequent PVC's 700  Occasional PAC's 70  Brief paroxsysmal atrial tachycardia (PAT) 7 beats  No atrial fibrillation  No pauses  Avg HR  65bpm  Reassuring monitor, palpitations correlate with PVC's/ PAC's. EF normal.  Could consider beta blocker but would prefer conservative management.  Candee Furbish, MD  2-D echo 9/14/17Study Conclusions  - Left ventricle: The cavity size was normal. Wall thickness was normal. The estimated ejection fraction was 55%. Wall motion was normal; there were no regional wall motion abnormalities. Doppler parameters are consistent with abnormal left ventricular relaxation (grade 1 diastolic dysfunction). - Aortic valve: There was no stenosis. - Mitral valve: There was trivial regurgitation. - Right ventricle: The cavity size was normal. Systolic function was normal. - Pulmonary arteries: No complete TR doppler jet so unable to estimate PA systolic pressure. - Systemic veins: IVC measured 2.1 cm with > 50% respirophasic variation, suggesting RA pressure 8 mmHg.  Impressions:  - Normal LV size with EF 55%. Normal RV size and systolic function. No significant valvular abnormalities.   GXT 9/14/17Blood pressure demonstrated a normal response to exercise.  There was no ST segment deviation noted during stress.  No T wave inversion was noted during stress.  Normal ECG stress test.  ASSESSMENT:    1. Persistent atrial fibrillation ([place])   2. Pre-procedure lab exam   3. Snoring   4. Prediabetes      PLAN:  In order of problems listed above:   Atrial fibrillation question duration. Patient asymptomatic. Patient's rate is controlled without any rate lowering medication. CHADSVASC=1 for age, 2 if counting prediabetes. 2-D echo a year ago showed normal systolic function. Discussed this patient detail with Dr. Radford Pax. Continue Xarelto. Plan 2-D echo then cardioversion in 4 weeks. Follow-up with Dr. Marlou Porch after cardioversion who can decide whether or not Xarelto needs to be continued long-term.  Preprocedure labs being drawn before his  cardioversion  Snoring with borderline sleep apnea test in the past. Patient would like to have this repeated. We'll order.  Prediabetes not on any medication and has already lost 16 pounds with plants to lose more  Medication Adjustments/Labs and Tests Ordered: Current medicines are reviewed at length with the patient today.  Concerns regarding medicines are outlined above.  Medication changes, Labs and Tests ordered today are listed in the Patient Instructions below. Patient Instructions  Medication Instructions: - Your physician recommends that you continue on your current medications as directed. Please refer to the Current Medication list given to you today.  Labwork: - Your physician recommends that you return for lab work on [employer]. April 04, 2017 : BMET and CBC  Procedures/Testing: - Your physician has recommended that you have a sleep study. This test records several body functions during sleep, including: brain activity, eye movement, oxygen and carbon dioxide blood levels, heart rate and rhythm, breathing rate and rhythm, the flow of air through your mouth and nose, snoring, body muscle movements, and chest and belly movement.  -Your physician has recommended that you have a Cardioversion (DCCV) - Tuesday April 10, 2017 at 12:00 Noon. Electrical Cardioversion uses a jolt of electricity to your heart either through paddles or wired patches attached to your chest. This is a controlled, usually prescheduled, procedure. Defibrillation is done under light anesthesia in the hospital, and you usually go home the day of the procedure. This is done to get your heart back into a normal rhythm. You are not awake for the procedure. Please see the instruction sheet given to you today.  -Your physician has requested that you have an echocardiogram (after Cardioversion) . Echocardiography is a painless test that uses sound waves to create images of your heart. It provides your doctor with  information about the size and shape of your heart and how well your heart's chambers and valves are working. This procedure takes approximately one hour. There are no restrictions for this procedure.    Follow-Up: - Your physician recommends that you schedule a follow-up appointment in after (Oct 2)your Cardioversion with Dr. Marlou Porch   If you need a refill on your cardiac medications before your next appointment, please call your pharmacy.      Sumner Boast, PA-C  03/13/2017 3:53 PM    [facility] [place], [place], [place]  [address] Phone: [phone]; Fax: [phone]       For DCCV; compliant with xarelto; no changes. Kirk Ruths

## 2017-04-10 NOTE — Anesthesia Preprocedure Evaluation (Signed)
Anesthesia Evaluation  Patient identified by MRN, date of birth, ID band Patient awake    Reviewed: Allergy & Precautions, H&P , NPO status , Patient's Chart, lab work & pertinent test results  Airway Mallampati: II  TM Distance: >3 FB Neck ROM: Full    Dental no notable dental hx. (+) Teeth Intact, Dental Advisory Given   Pulmonary neg pulmonary ROS,    Pulmonary exam normal breath sounds clear to auscultation       Cardiovascular negative cardio ROS Normal cardiovascular exam+ dysrhythmias Atrial Fibrillation  Rhythm:Regular Rate:Normal     Neuro/Psych  Headaches, negative psych ROS   GI/Hepatic Neg liver ROS, GERD  Medicated,  Endo/Other  negative endocrine ROS  Renal/GU negative Renal ROS  negative genitourinary   Musculoskeletal  (+) Arthritis , Osteoarthritis,    Abdominal   Peds  Hematology negative hematology ROS (+)   Anesthesia Other Findings   Reproductive/Obstetrics negative OB ROS                             Anesthesia Physical  Anesthesia Plan  ASA: II  Anesthesia Plan: General   Post-op Pain Management:    Induction: Intravenous  PONV Risk Score and Plan: 2 and Ondansetron and Midazolam  Airway Management Planned: Mask  Additional Equipment:   Intra-op Plan:   Post-operative Plan:   Informed Consent: I have reviewed the patients History and Physical, chart, labs and discussed the procedure including the risks, benefits and alternatives for the proposed anesthesia with the patient or authorized representative who has indicated his/her understanding and acceptance.   Dental advisory given  Plan Discussed with: CRNA  Anesthesia Plan Comments:         Anesthesia Quick Evaluation

## 2017-04-11 ENCOUNTER — Encounter (HOSPITAL_COMMUNITY): Payer: Self-pay | Admitting: Cardiology

## 2017-04-11 ENCOUNTER — Ambulatory Visit (HOSPITAL_COMMUNITY): Payer: Medicare Other | Attending: Cardiology

## 2017-04-11 ENCOUNTER — Other Ambulatory Visit: Payer: Self-pay

## 2017-04-11 DIAGNOSIS — R002 Palpitations: Secondary | ICD-10-CM | POA: Diagnosis not present

## 2017-04-11 DIAGNOSIS — I481 Persistent atrial fibrillation: Secondary | ICD-10-CM | POA: Diagnosis not present

## 2017-04-11 DIAGNOSIS — I4819 Other persistent atrial fibrillation: Secondary | ICD-10-CM

## 2017-04-11 DIAGNOSIS — Z8249 Family history of ischemic heart disease and other diseases of the circulatory system: Secondary | ICD-10-CM | POA: Diagnosis not present

## 2017-04-11 DIAGNOSIS — R079 Chest pain, unspecified: Secondary | ICD-10-CM | POA: Insufficient documentation

## 2017-04-17 ENCOUNTER — Telehealth: Payer: Self-pay | Admitting: Cardiology

## 2017-04-17 NOTE — Telephone Encounter (Signed)
Spoke with pt and wife who report pt woke this AM around 4 am.  He felt as if he was having an apnea episode. He is not feeling well at all per his report-heart beat doesn't feel right, irregular and around 90 bpm at rest.  They are unable to check if BP.  He is having SOB with walking short distances and felt faint once today while bending over.  He denies being in any current distress, no chest pain/SOB.  Given an appt for 10/10 at 9:30 am to be seen in the [facility].  Pt was very grateful for the call back and for the appt given for tomorrow.

## 2017-04-17 NOTE — Telephone Encounter (Signed)
New message    Patient c/o Palpitations:  High priority if patient c/o lightheadedness, shortness of breath, or chest pain  1) How long have you had palpitations/irregular HR/ Afib? Are you having the symptoms now? Started this morning. Pt is not feeling well per wife.  2) Are you currently experiencing lightheadedness, SOB or CP? Pt states no.   3) Do you have a history of afib (atrial fibrillation) or irregular heart rhythm? yes  4) Have you checked your BP or HR? (document readings if available): no   5) Are you experiencing any other symptoms? Pt does not feel well.

## 2017-04-18 ENCOUNTER — Encounter (HOSPITAL_COMMUNITY): Payer: Self-pay | Admitting: Nurse Practitioner

## 2017-04-18 ENCOUNTER — Other Ambulatory Visit: Payer: Self-pay

## 2017-04-18 ENCOUNTER — Ambulatory Visit (HOSPITAL_COMMUNITY)
Admission: RE | Admit: 2017-04-18 | Discharge: 2017-04-18 | Disposition: A | Discharge status: Home / Self Care | Payer: Medicare Other | Source: Ambulatory Visit | Attending: Nurse Practitioner | Admitting: Nurse Practitioner

## 2017-04-18 ENCOUNTER — Telehealth: Payer: Self-pay | Admitting: Pharmacist

## 2017-04-18 VITALS — BP 112/64 | HR 87 | Ht 74.0 in | Wt 225.0 lb

## 2017-04-18 DIAGNOSIS — Z9889 Other specified postprocedural states: Secondary | ICD-10-CM | POA: Insufficient documentation

## 2017-04-18 DIAGNOSIS — Z7902 Long term (current) use of antithrombotics/antiplatelets: Secondary | ICD-10-CM | POA: Diagnosis not present

## 2017-04-18 DIAGNOSIS — K589 Irritable bowel syndrome without diarrhea: Secondary | ICD-10-CM | POA: Diagnosis not present

## 2017-04-18 DIAGNOSIS — I481 Persistent atrial fibrillation: Secondary | ICD-10-CM | POA: Diagnosis not present

## 2017-04-18 DIAGNOSIS — R51 Headache: Secondary | ICD-10-CM | POA: Diagnosis not present

## 2017-04-18 DIAGNOSIS — Z83511 Family history of glaucoma: Secondary | ICD-10-CM | POA: Diagnosis not present

## 2017-04-18 DIAGNOSIS — Z8249 Family history of ischemic heart disease and other diseases of the circulatory system: Secondary | ICD-10-CM | POA: Diagnosis not present

## 2017-04-18 DIAGNOSIS — H409 Unspecified glaucoma: Secondary | ICD-10-CM | POA: Insufficient documentation

## 2017-04-18 DIAGNOSIS — Z820 Family history of epilepsy and other diseases of the nervous system: Secondary | ICD-10-CM | POA: Insufficient documentation

## 2017-04-18 DIAGNOSIS — I4819 Other persistent atrial fibrillation: Secondary | ICD-10-CM

## 2017-04-18 DIAGNOSIS — Z8500 Personal history of malignant neoplasm of unspecified digestive organ: Secondary | ICD-10-CM | POA: Insufficient documentation

## 2017-04-18 DIAGNOSIS — M1990 Unspecified osteoarthritis, unspecified site: Secondary | ICD-10-CM | POA: Insufficient documentation

## 2017-04-18 NOTE — Telephone Encounter (Signed)
Medication list reviewed in anticipation of upcoming Tikosyn initiation. Patient is not taking any contraindicated or QTc prolonging medications. Patient is anticoagulated on Xarelto 20mg  daily on the appropriate dose (CrCl 35mL/min).  Please ensure that patient has not missed any anticoagulation doses in the 3 weeks prior to Tikosyn initiation. Patient will need to be counseled to avoid use of Benadryl while on Tikosyn and in the 2-3 days prior to Tikosyn initiation.

## 2017-04-18 NOTE — Progress Notes (Signed)
Primary Care Physician: [name], MD Referring Physician: Dr. Everlene Other is a 71 y.o. male with a h/o  afib that was found at the time of a wellness exam with rate bing controlled and pt fairly asymptomatic. He was seen by Cardiology who set pt up for cardioversion which was done after loading of Doac x 3 weeks with a chadsvasc score of 2(age, Recently dx pre didabetic). He did shock out but returned to afib after a few days. Pt recently lost 30 lbs over the last few months with dx of pre diabetes. Marland KitchenHe is pending a sleep study. No significant caffeine or alcohol use, no tobacco use. He is in the afib clinic to discuss options to return to [place].  He had a ETT 03/2016, which was low risk, echo showed normal heart function. Baseline EKG in afib shows LAFB as well as an EKG from 2017 in SR .He runs low 60's in SR without any AV blocking agents.  Today, he denies symptoms of palpitations, chest pain, shortness of breath, orthopnea, PND, lower extremity edema, dizziness, presyncope, syncope, or neurologic sequela. The patient is tolerating medications without difficulties and is otherwise without complaint today.   Past Medical History:  Diagnosis Date  . Arthritis    mild arthritis- neck shoulders, more right shoulder-no problems at present.  . [facility])    GI tumor- stomach tumor "nonmalignant" last check 1 yr ago.  . Gastric mass    hx gastric mass- benign  . Glaucoma   . Glaucoma   . Glaucoma   . Headache disorder 03/08/2015   Topamax taken daily-very low grade headache to none now.  . History of colonic diverticulitis    left  . History of hiatal hernia    omeprazole as needed- "mild"  . [name] pupil [facility])    Past Surgical History:  Procedure Laterality Date  . CARDIOVERSION N/A 04/10/2017   Procedure: CARDIOVERSION;  Surgeon: Lelon Perla, MD;  Location: [facility] ENDOSCOPY;  Service: Cardiovascular;  Laterality: N/A;  . COLONOSCOPY N/A 01/21/2015   Procedure: COLONOSCOPY;  Surgeon: Garlan Fair, MD;  [facility];  Service: Endoscopy;  Laterality: N/A;-polyp removed in past-benign.  . ESOPHAGOGASTRODUODENOSCOPY (EGD) WITH PROPOFOL N/A 01/21/2015   Procedure: ESOPHAGOGASTRODUODENOSCOPY (EGD) WITH PROPOFOL;  Surgeon: Garlan Fair, MD;  [facility];  Service: Endoscopy;  Laterality: N/A;  . EUS N/A 02/04/2015   Procedure: UPPER ENDOSCOPIC ULTRASOUND (EUS) LINEAR;  Surgeon: Milus Banister, MD;  Location[facility]OSCOPY;  Service: Endoscopy;  Laterality: N/A;  . EUS N/A 02/03/2016   Procedure: UPPER ENDOSCOPIC ULTRASOUND (EUS) RADIAL;  Surgeon: Milus Banister, MD;  Location[facility] ENDOSCOPY;  Service: Endoscopy;  Laterality: N/A;  . EYE SURGERY     laser surgery for glaucoma  . HERNIA REPAIR Left    groin  . TONSILLECTOMY      Current Outpatient Prescriptions  Medication Sig Dispense Refill  . Cholecalciferol (VITAMIN D) 2000 units CAPS Take by mouth.    . DORZOLAMIDE HCL-TIMOLOL MAL OP Place 1 drop into both eyes 2 (two) times daily.    Marland Kitchen latanoprost (XALATAN) 0.005 % ophthalmic solution Place 1 drop into both eyes at bedtime.     . Multiple Vitamin (MULTIVITAMIN WITH MINERALS) TABS tablet Take 1 tablet by mouth daily.    . RHOPRESSA 0.02 % SOLN Place 1 drop into the left eye daily.  0  . rivaroxaban (XARELTO) 20 MG TABS tablet Take 1 tablet (20  mg total) by mouth daily with supper. 30 tablet 1  . topiramate (TOPAMAX) 50 MG tablet TAKE THREE TABLETS BY MOUTH ONCE DAILY 90 tablet 3  . VIAGRA 100 MG tablet TAKE 1/2 TO 1 TABLET EVERY 24 HOURS AS NEEDED  0   No current facility-administered medications for this encounter.     No Known Allergies  Social History   Social History  . Marital status: Married    Spouse name: N/A  . Number of children: 3  . Years of education: BA   Occupational History  . retired    Social History Main Topics  . Smoking status: Never Smoker  . Smokeless tobacco: Never Used  .  Alcohol use 1.2 - 2.4 oz/week    1 - 2 Standard drinks or equivalent, 1 - 2 Cans of beer per week     Comment: social - 1-2 beer per  week  . Drug use: No  . Sexual activity: Yes    Birth control/ protection: None   Other Topics Concern  . Not on file   Social History Narrative   Patient drinks 2 cups of caffeine daily.   Patient is right handed.    Family History  Problem Relation Age of Onset  . Glaucoma Mother   . Dementia Father   . Graves' disease Sister   . Heart disease Sister   . Migraines Sister   . Heart attack Brother   . CAD Brother     ROS- All systems are reviewed and negative except as per the HPI above  Physical Exam: Vitals:   04/18/17 0944  BP: 112/64  Pulse: 87  Weight: 225 lb (102.1 kg)  Height: 6\' 2"  (1.88 m)   Wt Readings from Last 3 Encounters:  04/18/17 225 lb (102.1 kg)  04/10/17 225 lb (102.1 kg)  03/13/17 236 lb 12.8 oz (107.4 kg)    Labs: Lab Results  Component Value Date   NA 141 04/04/2017   K 4.8 04/04/2017   CL 104 04/04/2017   CO2 20 04/04/2017   GLUCOSE 97 04/04/2017   BUN 14 04/04/2017   CREATININE 1.13 04/04/2017   CALCIUM 9.1 04/04/2017   No results found for: INR No results found for: CHOL, HDL, LDLCALC, TRIG   GEN- The patient is well appearing, alert and oriented x 3 today.   Head- normocephalic, atraumatic Eyes-  Sclera clear, conjunctiva pink Ears- hearing intact Oropharynx- clear Neck- supple, no JVP Lymph- no cervical lymphadenopathy Lungs- Clear to ausculation bilaterally, normal work of breathing Heart- irregular rate and rhythm, no murmurs, rubs or gallops, PMI not laterally displaced GI- soft, NT, ND, + BS Extremities- no clubbing, cyanosis, or edema MS- no significant deformity or atrophy Skin- no rash or lesion Psych- euthymic mood, full affect Neuro- strength and sensation are intact  EKG- afib at 87 bpm, qrs int 86 mg, qtc 411 ms Echo-Study Conclusions  - Left ventricle: The cavity size  was normal. Wall thickness was   normal. Systolic function was normal. The estimated ejection   fraction was in the range of 50% to 55%. Wall motion was normal;   there were no regional wall motion abnormalities. Left   ventricular diastolic function parameters were normal. - Left atrium: The atrium was mildly dilated. Volume/bsa, ES,   (1-plane Simpson&'s, A2C): 43.4 ml/m^2. - Right atrium: The atrium was mildly dilated.  Impressions:  - Compared to the prior study, there has been no significant   interval change.  1.Assessment and Plan: -Persistent  afib Successful cardioversion but ERAF Discussed options to manage afib He has IBS issues and would like to avoid multaq LAFB might preclude use of flecainide and I am concerned that if flecainide is used, rate control that is used with it may cause bradycardia, with HR in SR being slow in low 60's. On young side for amiodarone Tikosyn an option and discussed in detail re precautions Pt really would like front line ablation I discussed with  Dr. Rayann Heman and he agreed that LAFB and being slow in SR may make flecainide less than the best option,probably Tikosyn the best option, he would not be a candidate for an ablation since he has been persistent for some time, exact onset of afib unknown. Pt is aware and will check on price of drug and let me know when he would like to come into hospital for drug No use of benadryl  Qtc is acceptable PharmD to screen drugs Sleepstudy is pending Crcl cal at [employer] C. Kalix Meinecke, [facility] [address] [place], [place] [address] [phone]

## 2017-04-20 ENCOUNTER — Telehealth (HOSPITAL_COMMUNITY): Payer: Self-pay | Admitting: *Deleted

## 2017-04-20 NOTE — Telephone Encounter (Signed)
Dofetilide was not on patients formulary -- formulary exception completed and approved as a tier 3 medication coverage.  Authorized through April 19, 2018.

## 2017-04-23 ENCOUNTER — Ambulatory Visit (HOSPITAL_BASED_OUTPATIENT_CLINIC_OR_DEPARTMENT_OTHER)
Admission: RE | Admit: 2017-04-23 | Discharge: 2017-04-23 | Disposition: A | Discharge status: Home / Self Care | Payer: Medicare Other | Source: Ambulatory Visit | Attending: Nurse Practitioner | Admitting: Nurse Practitioner

## 2017-04-23 ENCOUNTER — Encounter (HOSPITAL_COMMUNITY): Payer: Self-pay | Admitting: Internal Medicine

## 2017-04-23 ENCOUNTER — Other Ambulatory Visit: Payer: Self-pay

## 2017-04-23 ENCOUNTER — Encounter (HOSPITAL_COMMUNITY): Payer: Self-pay | Admitting: Nurse Practitioner

## 2017-04-23 ENCOUNTER — Inpatient Hospital Stay (HOSPITAL_COMMUNITY)
Admission: AD | Admit: 2017-04-23 | Discharge: 2017-04-26 | DRG: 310 | Disposition: A | Discharge status: Home / Self Care | Payer: Medicare Other | Source: Ambulatory Visit | Attending: Internal Medicine | Admitting: Internal Medicine

## 2017-04-23 VITALS — BP 108/68 | HR 85 | Ht 74.0 in | Wt 225.6 lb

## 2017-04-23 DIAGNOSIS — G4730 Sleep apnea, unspecified: Secondary | ICD-10-CM | POA: Diagnosis not present

## 2017-04-23 DIAGNOSIS — R739 Hyperglycemia, unspecified: Secondary | ICD-10-CM | POA: Diagnosis not present

## 2017-04-23 DIAGNOSIS — I4819 Other persistent atrial fibrillation: Secondary | ICD-10-CM

## 2017-04-23 DIAGNOSIS — Z8249 Family history of ischemic heart disease and other diseases of the circulatory system: Secondary | ICD-10-CM | POA: Diagnosis not present

## 2017-04-23 DIAGNOSIS — M1990 Unspecified osteoarthritis, unspecified site: Secondary | ICD-10-CM | POA: Diagnosis present

## 2017-04-23 DIAGNOSIS — I481 Persistent atrial fibrillation: Principal | ICD-10-CM | POA: Diagnosis present

## 2017-04-23 DIAGNOSIS — Z83511 Family history of glaucoma: Secondary | ICD-10-CM

## 2017-04-23 DIAGNOSIS — H409 Unspecified glaucoma: Secondary | ICD-10-CM | POA: Diagnosis present

## 2017-04-23 DIAGNOSIS — Z7901 Long term (current) use of anticoagulants: Secondary | ICD-10-CM | POA: Diagnosis not present

## 2017-04-23 HISTORY — DX: Other persistent atrial fibrillation: I48.19

## 2017-04-23 LAB — BASIC METABOLIC PANEL
Anion gap: 9 (ref 5–15)
BUN: 21 mg/dL — ABNORMAL HIGH (ref 6–20)
CO2: 25 mmol/L (ref 22–32)
Calcium: 9.2 mg/dL (ref 8.9–10.3)
Chloride: 106 mmol/L (ref 101–111)
Creatinine, Ser: 1.3 mg/dL — ABNORMAL HIGH (ref 0.61–1.24)
GFR calc Af Amer: >60 mL/min (ref >60)
GFR calc non Af Amer: 54 mL/min — ABNORMAL LOW (ref >60)
Glucose, Bld: 123 mg/dL — ABNORMAL HIGH (ref 65–99)
Potassium: 4.4 mmol/L (ref 3.5–5.1)
Sodium: 140 mmol/L (ref 135–145)

## 2017-04-23 LAB — HEMOGLOBIN A1C
Hgb A1c MFr Bld: 5.6 % (ref 4.8–5.6)
Mean Plasma Glucose: 114.02 mg/dL

## 2017-04-23 LAB — MAGNESIUM: Magnesium: 2.2 mg/dL (ref 1.7–2.4)

## 2017-04-23 MED ORDER — RIVAROXABAN 20 MG PO TABS
20.0000 mg | ORAL_TABLET | Freq: Every day | ORAL | Status: DC
  Administered 2017-04-23 – 2017-04-25 (×3): 20 mg via ORAL
  Filled 2017-04-23 (×3): qty 1

## 2017-04-23 MED ORDER — SODIUM CHLORIDE 0.9% FLUSH
3.0000 mL | Freq: Two times a day (BID) | INTRAVENOUS | Status: DC
  Administered 2017-04-23 – 2017-04-26 (×6): 3 mL via INTRAVENOUS

## 2017-04-23 MED ORDER — SODIUM CHLORIDE 0.9% FLUSH: 3.0000 mL | INTRAVENOUS | Status: DC | PRN

## 2017-04-23 MED ORDER — LATANOPROST 0.005 % OP SOLN
1.0000 [drp] | OPHTHALMIC | Status: DC
  Administered 2017-04-23 – 2017-04-25 (×3): 1 [drp] via OPHTHALMIC

## 2017-04-23 MED ORDER — SODIUM CHLORIDE 0.9 % IV SOLN: 250.0000 mL | INTRAVENOUS | Status: DC | PRN

## 2017-04-23 MED ORDER — ADULT MULTIVITAMIN W/MINERALS CH
1.0000 | ORAL_TABLET | Freq: Every day | ORAL | Status: DC
  Administered 2017-04-24 – 2017-04-26 (×3): 1 via ORAL
  Filled 2017-04-23 (×3): qty 1

## 2017-04-23 MED ORDER — LATANOPROST 0.005 % OP SOLN
1.0000 [drp] | Freq: Every day | OPHTHALMIC | Status: DC
  Filled 2017-04-23: qty 2.5

## 2017-04-23 MED ORDER — TOPIRAMATE 100 MG PO TABS
50.0000 mg | ORAL_TABLET | Freq: Every day | ORAL | Status: DC
  Administered 2017-04-24 – 2017-04-25 (×2): 50 mg via ORAL
  Filled 2017-04-23 (×2): qty 1

## 2017-04-23 MED ORDER — DOFETILIDE 500 MCG PO CAPS
500.0000 ug | ORAL_CAPSULE | Freq: Two times a day (BID) | ORAL | Status: DC
  Administered 2017-04-23 – 2017-04-26 (×6): 500 ug via ORAL
  Filled 2017-04-23 (×6): qty 1

## 2017-04-23 MED ORDER — NETARSUDIL DIMESYLATE 0.02 % OP SOLN
1.0000 [drp] | OPHTHALMIC | Status: DC
  Administered 2017-04-23 – 2017-04-25 (×3): 1 [drp] via OPHTHALMIC

## 2017-04-23 NOTE — H&P (Signed)
ELECTROPHYSIOLOGY CONSULT NOTE    Patient ID: Leonard Williams MRN: [id number], DOB/AGE: Nov 21, 1945 71 y.o.  Admit date: 04/23/2017 Date of Consult: 04/23/2017  Primary Physician: [name], MD Primary Cardiologist: Marlou Porch Electrophysiologist: Allred (new this admission)  Patient Profile: Leonard Williams is a 71 y.o. male with a history of persistent AF who is being admitted for Tikosyn load  HPI:  Leonard Williams is a 71 y.o. male who was found to have newly diagnosed with AF at a recent wellness exam. He was started on [facility] and underwent DCCV which only held for a few days.  He was seen in the AF clinic and Tikosyn loading was discussed. He presents today for Tikosyn load. He is on Xarelto and reports no missed doses in the last 4 weeks.    He denies chest pain, palpitations, dyspnea, PND, orthopnea, nausea, vomiting, dizziness, syncope, edema, weight gain, or early satiety.  Thromboembolic risk factors ( age -40) for a CHADSVASc Score of  1   Past Medical History:  Diagnosis Date  . Arthritis    mild arthritis- neck shoulders, more right shoulder-no problems at present.  . Atrial fibrillation, persistent ([place])   . Cancer [facility])    GI tumor- stomach tumor "nonmalignant" last check 1 yr ago.  . Glaucoma   . Headache disorder 03/08/2015   Topamax taken daily-very low grade headache to none now.  . History of colonic diverticulitis    left  . History of hiatal hernia    omeprazole as needed- "mild"  . [name] pupil [facility])      Surgical History:  Past Surgical History:  Procedure Laterality Date  . CARDIOVERSION N/A 04/10/2017   Procedure: CARDIOVERSION;  Surgeon: Lelon Perla, MD;  Location: [facility] ENDOSCOPY;  Service: Cardiovascular;  Laterality: N/A;  . COLONOSCOPY N/A 01/21/2015   Procedure: COLONOSCOPY;  Surgeon: Garlan Fair, MD;  Location: [facility] ENDOSCOPY;  Service: Endoscopy;  Laterality: N/A;-polyp removed in past-benign.  . ESOPHAGOGASTRODUODENOSCOPY  (EGD) WITH PROPOFOL N/A 01/21/2015   Procedure: ESOPHAGOGASTRODUODENOSCOPY (EGD) WITH PROPOFOL;  Surgeon: Garlan Fair, MD;  Location: [facility] ENDOSCOPY;  Service: Endoscopy;  Laterality: N/A;  . EUS N/A 02/04/2015   Procedure: UPPER ENDOSCOPIC ULTRASOUND (EUS) LINEAR;  Surgeon: Milus Banister, MD;  Location: [facility] ENDOSCOPY;  Service: Endoscopy;  Laterality: N/A;  . EUS N/A 02/03/2016   Procedure: UPPER ENDOSCOPIC ULTRASOUND (EUS) RADIAL;  Surgeon: Milus Banister, MD;  Location: [facility] ENDOSCOPY;  Service: Endoscopy;  Laterality: N/A;  . EYE SURGERY     laser surgery for glaucoma  . HERNIA REPAIR Left    groin  . TONSILLECTOMY       Prescriptions Prior to Admission  Medication Sig Dispense Refill Last Dose  . Cholecalciferol (VITAMIN D) 2000 units CAPS Take 2,000 Units by mouth daily.    04/22/2017 at Unknown time  . DORZOLAMIDE HCL-TIMOLOL MAL OP Place 1 drop into both eyes 2 (two) times daily.   04/23/2017 at am  . latanoprost (XALATAN) 0.005 % ophthalmic solution Place 1 drop into both eyes at bedtime.    04/22/2017 at Unknown time  . Multiple Vitamin (MULTIVITAMIN WITH MINERALS) TABS tablet Take 1 tablet by mouth daily.   04/22/2017 at Unknown time  . RHOPRESSA 0.02 % SOLN Place 1 drop into the left eye daily.  0 04/22/2017 at Unknown time  . rivaroxaban (XARELTO) 20 MG TABS tablet Take 1 tablet (20 mg total) by mouth daily with supper. 30 tablet 1 04/22/2017 at 1830  .  topiramate (TOPAMAX) 50 MG tablet TAKE THREE TABLETS BY MOUTH ONCE DAILY (Patient taking differently: TAKE THREE TABLETS (150mg ) BY MOUTH ONCE DAILY) 90 tablet 3 04/22/2017 at Unknown time  . VIAGRA 100 MG tablet TAKE 1/2 (50mg ) TO 1 TABLET (100mg ) EVERY 24 HOURS AS NEEDED for erectile dysfunction.  0  at PRN    Inpatient Medications:  . dofetilide  500 mcg Oral BID  . latanoprost  1 drop Both Eyes QHS  . [START ON 04/24/2017] multivitamin with minerals  1 tablet Oral Daily  . Netarsudil [name]  1 drop Left Eye Daily  .  rivaroxaban  20 mg Oral Q supper  . sodium chloride flush  3 mL Intravenous Q12H  . topiramate  50 mg Oral QHS    Allergies: No Known Allergies  Social History   Social History  . Marital status: Married    Spouse name: N/A  . Number of children: 3  . Years of education: BA   Occupational History  . retired    Social History Main Topics  . Smoking status: Never Smoker  . Smokeless tobacco: Never Used  . Alcohol use 1.2 - 2.4 oz/week    1 - 2 Standard drinks or equivalent, 1 - 2 Cans of beer per week     Comment: social - 1-2 beer per  week  . Drug use: No  . Sexual activity: Yes    Birth control/ protection: None   Other Topics Concern  . Not on file   Social History Narrative   Patient drinks 2 cups of caffeine daily.   Patient is right handed.     Family History  Problem Relation Age of Onset  . Glaucoma Mother   . Dementia Father   . Graves' disease Sister   . Heart disease Sister   . Migraines Sister   . Heart attack Brother   . CAD Brother      Review of Systems: All other systems reviewed and are otherwise negative except as noted above.  Physical Exam: Vitals:   04/23/17 1226 04/23/17 1411  BP: 119/80 125/81  Pulse: 88 78  Resp: 19 18  Temp: 97.7 F (36.5 C)   TempSrc: Oral Oral  SpO2:  100%  Weight: 221 lb 6.4 oz (100.4 kg)   Height: 6\' 2"  (1.88 m)     GEN- The patient is well appearing, alert and oriented x 3 today.   HEENT: normocephalic, atraumatic; sclera clear, conjunctiva pink; hearing intact; oropharynx clear; neck supple Lungs- Clear to ausculation bilaterally, normal work of breathing.  No wheezes, rales, rhonchi Heart- Irregular rate and rhythm, no murmurs, rubs or gallops GI- soft, non-tender, non-distended, bowel sounds present Extremities- no clubbing, cyanosis, or edema; DP/PT/radial pulses 2+ bilaterally MS- no significant deformity or atrophy Skin- warm and dry, no rash or lesion Psych- euthymic mood, full affect Neuro-  strength and sensation are intact  Labs:   Lab Results  Component Value Date   WBC 4.4 04/04/2017   HGB 14.9 04/04/2017   HCT 42.8 04/04/2017   MCV 87 04/04/2017   PLT 221 04/04/2017     Recent Labs Lab 04/23/17 0944  NA 140  K 4.4  CL 106  CO2 25  BUN 21*  CREATININE 1.30*  CALCIUM 9.2  GLUCOSE 123*      Radiology/Studies: No results found.  EKG:AF, rate 85, QTc 421 (personally reviewed)  Assessment/Plan: 1.  Persistent atrial fibrillation Admitted for Tikosyn loading Labs, QTc stable today Will start Tikosyn 530mcg  twice daily with plan for DCCV on Wednesday if not converted. Continue Xarelto long term for CHADS2VASC of 2 (age, borderline DM) Sleep study scheduled for next month. He has lost 20 pounds and plans to lose 20 more.  Per AF clinic notes, plan for referral for PVI if fails Tikosyn (will have Dr Rayann Heman follow while here)  Dr Caryl Comes to see later today.   Signed, Virl Axe 04/23/2017 5:23 PM  ATRIAL FIBRILLATION PERSISTENT  Hyperglycemia  Renal function-normal (estimated creatinine clearance 74.5)  The patient is admitted for dofetilide loading.   He is aware of the potential proarrhythmic side effects. He was markedly better following cardioversion and markedly worse following reversion to atrial fibrillation. Retrospectively, the paucity of symptoms at the time of his cardioversion were likely related to they're insidious onsetand he has not been aware of the untowards associated with his atrial fibrillation  Renal function is adequate for dofetilide 500 g twice a day  On Anticoagulation;  No bleeding issues   Anticipate cardioversion on Wednesday if he is not reverted spontaneously

## 2017-04-23 NOTE — Progress Notes (Addendum)
Pharmacy Review for Dofetilide (Tikosyn) Initiation  Admit Complaint: 71 y.o. male admitted 04/23/2017 with atrial fibrillation to be initiated on dofetilide.   Assessment:  Patient Exclusion Criteria: If any screening criteria checked as "Yes", then  patient  should NOT receive dofetilide until criteria item is corrected. If "Yes" please indicate correction plan.  YES  NO Patient  Exclusion Criteria Correction Plan  []  [x]  Baseline QTc interval is greater than or equal to 440 msec. IF above YES box checked dofetilide contraindicated unless patient has ICD; then may proceed if QTc 500-550 msec or with known ventricular conduction abnormalities may proceed with QTc 550-600 msec. QTc =  421   []  [x]  Magnesium level is less than 1.8 mEq/l : Last magnesium:  Lab Results  Component Value Date   MG 2.2 04/23/2017         []  [x]  Potassium level is less than 4 mEq/l : Last potassium:  Lab Results  Component Value Date   K 4.4 04/23/2017         []  [x]  Patient is known or suspected to have a digoxin level greater than 2 ng/ml: No results found for: DIGOXIN    []  [x]  Creatinine clearance less than 20 ml/min (calculated using Cockcroft-Gault, actual body weight and serum creatinine): Estimated Creatinine Clearance: 66.9 mL/min (A) (by C-G formula based on SCr of 1.3 mg/dL (H)).    []  [x]  Patient has received drugs known to prolong the QT intervals within the last 48 hours (phenothiazines, tricyclics or tetracyclic antidepressants, erythromycin, H-1 antihistamines, cisapride, fluoroquinolones, azithromycin). Drugs not listed above may have an, as yet, undetected potential to prolong the QT interval, updated information on QT prolonging agents is available at this website:QT prolonging agents   []  [x]  Patient received a dose of hydrochlorothiazide (Oretic) alone or in any combination including triamterene (Dyazide, Maxzide) in the last 48 hours.   []  [x]  Patient received a medication known to  increase dofetilide plasma concentrations prior to initial dofetilide dose:  . Trimethoprim (Primsol, Proloprim) in the last 36 hours . Verapamil (Calan, Verelan) in the last 36 hours or a sustained release dose in the last 72 hours . Megestrol (Megace) in the last 5 days  . Cimetidine (Tagamet) in the last 6 hours . Ketoconazole (Nizoral) in the last 24 hours . Itraconazole (Sporanox) in the last 48 hours  . Prochlorperazine (Compazine) in the last 36 hours    []  [x]  Patient is known to have a history of torsades de pointes; congenital or acquired long QT syndromes.   []  [x]  Patient has received a Class 1 antiarrhythmic with less than 2 half-lives since last dose. (Disopyramide, Quinidine, Procainamide, Lidocaine, Mexiletine, Flecainide, Propafenone)   []  [x]  Patient has received amiodarone therapy in the past 3 months or amiodarone level is greater than 0.3 ng/ml.    Patient has been appropriately anticoagulated with Xarelto 20 mg daily.  Ordering provider was confirmed at [email or web address] if they are not listed on the [place] Prescribers list.  Goal of Therapy: Follow renal function, electrolytes, potential drug interactions, and dose adjustment. Provide education and 1 week supply at discharge.  Plan:  [x]   Physician selected initial dose within range recommended for patients level of renal function - will monitor for response.  []   Physician selected initial dose outside of range recommended for patients level of renal function - will discuss if the dose should be altered at this time.   Select One Calculated CrCl  Dose q12h  [x]  >  60 ml/min 500 mcg  []  40-60 ml/min 250 mcg  []  20-40 ml/min 125 mcg   2. Follow up QTc after the first 5 doses, renal function, electrolytes (K & Mg) daily x 3     days, dose adjustment, success of initiation and facilitate 1 week discharge supply as     clinically indicated.  3. Initiate Tikosyn education video (Call [phone] and ask for  Tikosyn Video # 116).  4. Place Enrollment Form on the chart for discharge supply of dofetilide.  Nicole Cella, RPh Clinical Pharmacist [phone]-[phone] [phone] 330p-1030p phone [phone] or [phone] Main pharmacy [phone]   1:38 PM 04/23/2017

## 2017-04-23 NOTE — Progress Notes (Signed)
Primary Care Physician: [name], MD Referring Physician: Dr. Everlene Other is a 71 y.o. male with a h/o  afib that was found at the time of a wellness exam this year in August with rate bing controlled and pt fairly asymptomatic. Duration of afib unknown but thought to be persistent for months, possibly up to one year as he had not seen a MD since last yearly physical. He was seen by Cardiology who set pt up for cardioversion which was done after loading of Doac x 3 weeks with a chadsvasc score of 2(age, Recently dx pre didabetic). He did shock out but returned to afib after a few days. Pt recently lost 30 lbs over the last few months with dx of pre diabetes. Marland KitchenHe is pending a sleep study. No significant caffeine or alcohol use, no tobacco use. He is in the afib clinic to discuss options to return to [place].  He had a ETT 03/2016, which was low risk, echo showed normal heart function. Baseline EKG in afib shows LAFB as well as an EKG from 2017 in SR .He runs low 60's in SR without any AV blocking agents.  F/u in afib clinic, 10/15. I saw 10/10 and discussed options to restore SR, and pt wanted to thinka about options. He is in clinic today to come into hosptial for [place] admission.  Today, he denies symptoms of palpitations, chest pain, shortness of breath, orthopnea, PND, lower extremity edema, dizziness, presyncope, syncope, or neurologic sequela. The patient is tolerating medications without difficulties and is otherwise without complaint today.   Past Medical History:  Diagnosis Date  . Arthritis    mild arthritis- neck shoulders, more right shoulder-no problems at present.  . Cancer [facility])    GI tumor- stomach tumor "nonmalignant" last check 1 yr ago.  . Gastric mass    hx gastric mass- benign  . Glaucoma   . Glaucoma   . Glaucoma   . Headache disorder 03/08/2015   Topamax taken daily-very low grade headache to none now.  . History of colonic diverticulitis    left  .  History of hiatal hernia    omeprazole as needed- "mild"  . 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 CARDIOVERSION N/A 04/10/2017   Procedure: CARDIOVERSION;  Surgeon: Lelon Perla, MD;  Location: [facility] ENDOSCOPY;  Service: Cardiovascular;  Laterality: N/A;  . COLONOSCOPY N/A 01/21/2015   Procedure: COLONOSCOPY;  Surgeon: Garlan Fair, MD;  Location: [facility] ENDOSCOPY;  Service: Endoscopy;  Laterality: N/A;-polyp removed in past-benign.  . ESOPHAGOGASTRODUODENOSCOPY (EGD) WITH PROPOFOL N/A 01/21/2015   Procedure: ESOPHAGOGASTRODUODENOSCOPY (EGD) WITH PROPOFOL;  Surgeon: Garlan Fair, MD;  Location: [facility] ENDOSCOPY;  Service: Endoscopy;  Laterality: N/A;  . EUS N/A 02/04/2015   Procedure: UPPER ENDOSCOPIC ULTRASOUND (EUS) LINEAR;  Surgeon: Milus Banister, MD;  Location: [facility] ENDOSCOPY;  Service: Endoscopy;  Laterality: N/A;  . EUS N/A 02/03/2016   Procedure: UPPER ENDOSCOPIC ULTRASOUND (EUS) RADIAL;  Surgeon: Milus Banister, MD;  Location: [facility] ENDOSCOPY;  Service: Endoscopy;  Laterality: N/A;  . EYE SURGERY     laser surgery for glaucoma  . HERNIA REPAIR Left    groin  . TONSILLECTOMY      No current facility-administered medications for this encounter.    No current outpatient prescriptions on file.   Facility-Administered Medications Ordered in Other Encounters  Medication Dose Route Frequency Provider Last Rate Last Dose  . 0.9 %  sodium chloride infusion  250 mL Intravenous PRN [name] K, NP      . dofetilide (TIKOSYN) capsule 500 mcg  500 mcg Oral BID [name] K, NP      . latanoprost (XALATAN) 0.005 % ophthalmic solution 1 drop  1 drop Both Eyes QHS [name] K, NP      . Derrill Memo ON 04/24/2017] multivitamin with minerals tablet 1 tablet  1 tablet Oral Daily Patsey Berthold, NP      . Netarsudil [name] 0.02 % SOLN 1 drop  1 drop Left Eye Daily Seiler, [employer] K, NP      . rivaroxaban (XARELTO) tablet 20 mg  20 mg Oral Q supper  Lynnell Jude, [employer] K, NP      . sodium chloride flush (NS) 0.9 % injection 3 mL  3 mL Intravenous Q12H Seiler, [employer] K, NP      . sodium chloride flush (NS) 0.9 % injection 3 mL  3 mL Intravenous PRN [name] K, NP      . topiramate (TOPAMAX) tablet 50 mg  50 mg Oral QHS Patsey Berthold, NP        No Known Allergies  Social History   Social History  . Marital status: Married    Spouse name: N/A  . Number of children: 3  . Years of education: BA   Occupational History  . retired    Social History Main Topics  . Smoking status: Never Smoker  . Smokeless tobacco: Never Used  . Alcohol use 1.2 - 2.4 oz/week    1 - 2 Standard drinks or equivalent, 1 - 2 Cans of beer per week     Comment: social - 1-2 beer per  week  . Drug use: No  . Sexual activity: Yes    Birth control/ protection: None   Other Topics Concern  . Not on file   Social History Narrative   Patient drinks 2 cups of caffeine daily.   Patient is right handed.    Family History  Problem Relation Age of Onset  . Glaucoma Mother   . Dementia Father   . Graves' disease Sister   . Heart disease Sister   . Migraines Sister   . Heart attack Brother   . CAD Brother     ROS- All systems are reviewed and negative except as per the HPI above  Physical Exam: Vitals:   04/23/17 0935  BP: 108/68  Pulse: 85  Weight: 225 lb 9.6 oz (102.3 kg)  Height: 6\' 2"  (1.88 m)   Wt Readings from Last 3 Encounters:  04/23/17 221 lb 6.4 oz (100.4 kg)  04/23/17 225 lb 9.6 oz (102.3 kg)  04/18/17 225 lb (102.1 kg)    Labs: Lab Results  Component Value Date   NA 140 04/23/2017   K 4.4 04/23/2017   CL 106 04/23/2017   CO2 25 04/23/2017   GLUCOSE 123 (H) 04/23/2017   BUN 21 (H) 04/23/2017   CREATININE 1.30 (H) 04/23/2017   CALCIUM 9.2 04/23/2017   MG 2.2 04/23/2017   No results found for: INR No results found for: CHOL, HDL, LDLCALC, TRIG   GEN- The patient is well appearing, alert and oriented x 3 today.   Head-  normocephalic, atraumatic Eyes-  Sclera clear, conjunctiva pink Ears- hearing intact Oropharynx- clear Neck- supple, no JVP Lymph- no cervical lymphadenopathy Lungs- Clear to ausculation bilaterally, normal work of breathing Heart- irregular rate and rhythm, no murmurs, rubs or gallops, PMI not  laterally displaced GI- soft, NT, ND, + BS Extremities- no clubbing, cyanosis, or edema MS- no significant deformity or atrophy Skin- no rash or lesion Psych- euthymic mood, full affect Neuro- strength and sensation are intact  EKG- afib at 85 bpm, LAFB, qrs int 86 mg, qtc 421 ms,  Only EKG in system in SR, reviewed from 03/09/2016-showed SR at 62 bpm, LAFB, QTc 402 ms  Echo-Study Conclusions  - Left ventricle: The cavity size was normal. Wall thickness was   normal. Systolic function was normal. The estimated ejection   fraction was in the range of 50% to 55%. Wall motion was normal;   there were no regional wall motion abnormalities. Left   ventricular diastolic function parameters were normal. - Left atrium: The atrium was mildly dilated. Volume/bsa, ES,   (1-plane Simpson&'s, A2C): 43.4 ml/m^2. - Right atrium: The atrium was mildly dilated.  Impressions:  - Compared to the prior study, there has been no significant   interval change.     1.Assessment and Plan: -Persistent  Afib, duration unknown Successful cardioversion, 10/2, but ERAF Discussed options to manage afib He has IBS issues and would like to avoid multaq LAFB could be of concern with use of flecainide and I am concerned that if flecainide is used, rate control that is used with it may cause significant  bradycardia, with HR in SR being slow in low 60's. On young side for amiodarone Tikosyn an option and discussed in detail re precautions Pt really would like front line ablation I discussed with  Dr. Rayann Heman and he agreed that LAFB and being slow in SR may make flecainide less than the best option,probably Tikosyn  the best option, he would not be a candidate for an ablationn up front, since he has been persistent for some time, exact onset of afib unknown. Pt is aware of price of drug No use of benadryl  Qtc is acceptable PharmD screened drugs and no qtc prolonging drugs on board Sleep study is pending Crcl cal at 76.33mg /dl Bmet/mag this am show Kt/mag at good levels to start Tikosyn, no missed doses of xarelto with a chadsvasc score of 2.  Leonard Williams, [facility] [address] [place], [place] [address] [phone]

## 2017-04-24 ENCOUNTER — Ambulatory Visit: Payer: Medicare Other | Admitting: Physician Assistant

## 2017-04-24 ENCOUNTER — Encounter (HOSPITAL_COMMUNITY): Payer: Self-pay

## 2017-04-24 LAB — BASIC METABOLIC PANEL
Anion gap: 9 (ref 5–15)
BUN: 17 mg/dL (ref 6–20)
CO2: 26 mmol/L (ref 22–32)
Calcium: 9 mg/dL (ref 8.9–10.3)
Chloride: 105 mmol/L (ref 101–111)
Creatinine, Ser: 1.3 mg/dL — ABNORMAL HIGH (ref 0.61–1.24)
GFR calc Af Amer: >60 mL/min (ref >60)
GFR calc non Af Amer: 54 mL/min — ABNORMAL LOW (ref >60)
Glucose, Bld: 91 mg/dL (ref 65–99)
Potassium: 4 mmol/L (ref 3.5–5.1)
Sodium: 140 mmol/L (ref 135–145)

## 2017-04-24 LAB — MAGNESIUM: Magnesium: 2.2 mg/dL (ref 1.7–2.4)

## 2017-04-24 NOTE — Care Management Note (Addendum)
Case Management Note  Patient Details  Name: Leonard Williams MRN: [id number] Date of Birth: [birth year]  Subjective/Objective:  Pt presented for Tikosyn load.        Action/Plan: Benefits check in progress for Tikosyn.  CM will make pt aware.  CM will assist with 7-day supply from [place].  MD to write script for 7-day supply with no refills. Original script to be written with refills.  Expected Discharge Date:                  Expected Discharge Plan:  Home/Self Care  In-House Referral:  NA  Discharge planning Services  CM Consult, Medication Assistance  Post Acute Care Choice:  NA Choice offered to:  NA  DME Arranged:  N/A DME Agency:  NA  HH Arranged:  NA HH Agency:  NA  Status of Service:  Completed, signed off  If discussed at [place] of Stay Meetings, dates discussed:    Additional Comments: Benefits check- DOFETILIDE  125 MCG , 250 MCG AND 500 MCG BID  COVER- YES  CO-PAY- 45 % OF [place]  TIER- 4 DRUG  PRIOR APPROVAL- NO   DEDUCTIBLE : HAS BEEN MET  PATIENT IN INITIAL COVERAGE     PREFERRED PHARMACY : [facility], [facility] AND [facility]   Pt states he uses [facility] on [address].  Pt had gotten co-pay information PTA and states co-pay is $35-40/month for generic.  Verified with [facility] that drug is available. Arley Phenix, RN 04/24/2017, 10:51 AM

## 2017-04-24 NOTE — Progress Notes (Signed)
Electrophysiology Rounding Note  Patient Name: Leonard Williams Date of Encounter: 04/24/2017  Primary Cardiologist: Marlou Porch Electrophysiologist: Julianny Milstein (new this admission)   Subjective   The patient is doing well today.  At this time, the patient denies chest pain, shortness of breath, or any new concerns. He is symptomatically improved in SR  Inpatient Medications    Scheduled Meds: . dofetilide  500 mcg Oral BID  . latanoprost  1 drop Both Eyes Q24H  . multivitamin with minerals  1 tablet Oral Daily  . Netarsudil [name]  1 drop Left Eye Q24H  . rivaroxaban  20 mg Oral Q supper  . sodium chloride flush  3 mL Intravenous Q12H  . topiramate  50 mg Oral QHS   Continuous Infusions: . sodium chloride     PRN Meds: sodium chloride, sodium chloride flush   Vital Signs    Vitals:   04/23/17 1226 04/23/17 1411 04/23/17 2050 04/24/17 0500  BP: 119/80 125/81 128/82 (!) 108/57  Pulse: 88 78  62  Resp: 19 18 18 18   Temp: 97.7 F (36.5 C)  98.2 F (36.8 C) 98 F (36.7 C)  TempSrc: Oral Oral Oral Oral  SpO2:  100% 98% 99%  Weight: 221 lb 6.4 oz (100.4 kg)   219 lb 8 oz (99.6 kg)  Height: 6\' 2"  (1.88 m)       Intake/Output Summary (Last 24 hours) at 04/24/17 0622 Last data filed at 04/23/17 2000  Gross per 24 hour  Intake              600 ml  Output                0 ml  Net              600 ml   Filed Weights   04/23/17 1226 04/24/17 0500  Weight: 221 lb 6.4 oz (100.4 kg) 219 lb 8 oz (99.6 kg)    Physical Exam    GEN- The patient is well appearing, alert and oriented x 3 today.   Head- normocephalic, atraumatic Eyes-  Sclera clear, conjunctiva pink Ears- hearing intact Oropharynx- clear Neck- supple Lungs- Clear to ausculation bilaterally, normal work of breathing Heart- Regular rate and rhythm, no murmurs, rubs or gallops GI- soft, NT, ND, + BS Extremities- no clubbing, cyanosis, or edema Skin- no rash or lesion Psych- euthymic mood, full  affect Neuro- strength and sensation are intact  Labs    Basic Metabolic Panel  Recent Labs  04/23/17 0944  NA 140  K 4.4  CL 106  CO2 25  GLUCOSE 123*  BUN 21*  CREATININE 1.30*  CALCIUM 9.2  MG 2.2   Hemoglobin A1C  Recent Labs  04/23/17 1817  HGBA1C 5.6    Telemetry    Sinus rhythm (personally reviewed)  Radiology    No results found.   Patient Profile     Leonard Williams is a 71 y.o. male admitted for Tikosyn load, converted to SR after 1st dose.   Assessment & Plan    1.  Persistent symptomatic AF Admitted for Tikosyn load Converted to SR after 1st dose QTc stable this morning BMET, Mg pending Continue [place] for CHADS2VASC of 1-2 Sleep study pending  Anticipate discharge Thursday.  Prior auth for Tikosyn already done by AF clinic.  Dr Rayann Heman to see later today.   Signed, [name], NP  04/24/2017, 6:22 AM    I have seen, examined the patient, and reviewed the  above assessment and plan.  Changes to above are made where necessary.  On exam RRR.  He is happy that he has converted to sinus rhythm and feels "much better".  No arrhythmias on telemetry.  QT is stable.  No changes today.  Co Sign: Thompson Grayer, MD 04/24/2017 5:00 PM

## 2017-04-25 LAB — BASIC METABOLIC PANEL
Anion gap: 8 (ref 5–15)
BUN: 16 mg/dL (ref 6–20)
CO2: 26 mmol/L (ref 22–32)
Calcium: 8.9 mg/dL (ref 8.9–10.3)
Chloride: 105 mmol/L (ref 101–111)
Creatinine, Ser: 1.18 mg/dL (ref 0.61–1.24)
GFR calc Af Amer: >60 mL/min (ref >60)
GFR calc non Af Amer: >60 mL/min (ref >60)
Glucose, Bld: 96 mg/dL (ref 65–99)
Potassium: 4.1 mmol/L (ref 3.5–5.1)
Sodium: 139 mmol/L (ref 135–145)

## 2017-04-25 LAB — MAGNESIUM: Magnesium: 2.2 mg/dL (ref 1.7–2.4)

## 2017-04-25 MED ORDER — DORZOLAMIDE HCL-TIMOLOL MAL 2-0.5 % OP SOLN
1.0000 [drp] | Freq: Two times a day (BID) | OPHTHALMIC | Status: DC
  Administered 2017-04-25 – 2017-04-26 (×2): 1 [drp] via OPHTHALMIC

## 2017-04-25 NOTE — Progress Notes (Signed)
Electrophysiology Rounding Note  Patient Name: Leonard Williams Date of Encounter: 04/25/2017  Primary Cardiologist: Marlou Porch Electrophysiologist: Carmelia Tiner   Subjective   The patient is doing well today.  At this time, the patient denies chest pain, shortness of breath, or any new concerns.  Inpatient Medications    Scheduled Meds: . dofetilide  500 mcg Oral BID  . latanoprost  1 drop Both Eyes Q24H  . multivitamin with minerals  1 tablet Oral Daily  . Netarsudil [name]  1 drop Left Eye Q24H  . rivaroxaban  20 mg Oral Q supper  . sodium chloride flush  3 mL Intravenous Q12H  . topiramate  50 mg Oral QHS   Continuous Infusions: . sodium chloride     PRN Meds: sodium chloride, sodium chloride flush   Vital Signs    Vitals:   04/23/17 2050 04/24/17 0500 04/24/17 1900 04/25/17 0541  BP: 128/82 (!) 108/57 122/70 113/67  Pulse:  62 67 (!) 52  Resp: 18 18 17 16   Temp: 98.2 F (36.8 C) 98 F (36.7 C) 97.9 F (36.6 C) 98.5 F (36.9 C)  TempSrc: Oral Oral Oral Oral  SpO2: 98% 99% 100% 100%  Weight:  219 lb 8 oz (99.6 kg)  218 lb 9.6 oz (99.2 kg)  Height:        Intake/Output Summary (Last 24 hours) at 04/25/17 0748 Last data filed at 04/24/17 2038  Gross per 24 hour  Intake              490 ml  Output             3500 ml  Net            -3010 ml   Filed Weights   04/23/17 1226 04/24/17 0500 04/25/17 0541  Weight: 221 lb 6.4 oz (100.4 kg) 219 lb 8 oz (99.6 kg) 218 lb 9.6 oz (99.2 kg)    Physical Exam    GEN- The patient is well appearing, alert and oriented x 3 today.   Head- normocephalic, atraumatic Eyes-  Sclera clear, conjunctiva pink Ears- hearing intact Oropharynx- clear Neck- supple Lungs- Clear to ausculation bilaterally, normal work of breathing Heart- Regular rate and rhythm, no murmurs, rubs or gallops GI- soft, NT, ND, + BS Extremities- no clubbing, cyanosis, or edema Skin- no rash or lesion Psych- euthymic mood, full affect Neuro-  strength and sensation are intact  Labs    Basic Metabolic Panel  Recent Labs  04/24/17 0429 04/25/17 0456  NA 140 139  K 4.0 4.1  CL 105 105  CO2 26 26  GLUCOSE 91 96  BUN 17 16  CREATININE 1.30* 1.18  CALCIUM 9.0 8.9  MG 2.2 2.2   Hemoglobin A1C  Recent Labs  04/23/17 1817  HGBA1C 5.6     Telemetry    Sinus rhythm (personally reviewed)  Radiology    No results found.   Patient Profile     Leonard Williams is a 71 y.o. male admitted for Tikosyn load  Assessment & Plan    1.  Persistent symptomatic AF Admitted for Tikosyn, maintaining SR K, Mg, QTc stable Continue OAC Sleep study pending  Anticipate discharge tomorrow.   Signed, [name], NP  04/25/2017, 7:48 AM   I have seen, examined the patient, and reviewed the above assessment and plan.  Changes to above are made where necessary.  On exam, RRR.  QT is stable, no ventricular arrhythmias.  No changes.  Co Sign: [name]  Leonard Ruan, MD 04/25/2017 8:45 PM

## 2017-04-26 LAB — BASIC METABOLIC PANEL
Anion gap: 10 (ref 5–15)
BUN: 16 mg/dL (ref 6–20)
CO2: 25 mmol/L (ref 22–32)
Calcium: 9.2 mg/dL (ref 8.9–10.3)
Chloride: 105 mmol/L (ref 101–111)
Creatinine, Ser: 1.11 mg/dL (ref 0.61–1.24)
GFR calc Af Amer: >60 mL/min (ref >60)
GFR calc non Af Amer: >60 mL/min (ref >60)
Glucose, Bld: 88 mg/dL (ref 65–99)
Potassium: 5.2 mmol/L — ABNORMAL HIGH (ref 3.5–5.1)
Sodium: 140 mmol/L (ref 135–145)

## 2017-04-26 LAB — MAGNESIUM: Magnesium: 2.3 mg/dL (ref 1.7–2.4)

## 2017-04-26 MED ORDER — DOFETILIDE 500 MCG PO CAPS: 500.0000 ug | ORAL_CAPSULE | Freq: Two times a day (BID) | ORAL | 3 refills | Status: DC

## 2017-04-26 NOTE — Plan of Care (Addendum)
Problem: Cardiac: Goal: Ability to achieve and maintain adequate cardiopulmonary perfusion will improve Outcome: Progressing Patient receiving fifth dose of Tikosyn this evening, QTC at administration 438. 3 HR post admin QC 456. Throughout shift remains SR/SB. No complaints of SOB or Chest Pain.

## 2017-04-26 NOTE — Progress Notes (Signed)
Leonard Williams to be D/C'd Home per MD order.  Discussed with the patient and all questions fully answered.  VSS, Skin clean, dry and intact without evidence of skin break down, no evidence of skin tears noted. IV catheter discontinued intact. Site without signs and symptoms of complications. Dressing and pressure applied.  An After Visit Summary was printed and given to the patient. Patient received prescription.  D/c education completed with patient/family including follow up instructions, medication list, d/c activities limitations if indicated, with other d/c instructions as indicated by MD - patient able to verbalize understanding, all questions fully answered.   Patient instructed to return to ED, call 911, or call MD for any changes in condition.   Patient escorted via [place], and D/C home via private auto.  [name] 04/26/2017 3:28 PM

## 2017-04-26 NOTE — Discharge Summary (Signed)
ELECTROPHYSIOLOGY PROCEDURE DISCHARGE SUMMARY    Patient ID: Leonard Williams,  MRN: [id number], DOB/AGE: [birth year] 71 y.o.  Admit date: 04/23/2017 Discharge date: 04/26/2017  Primary Care Physician: [name], MD Primary Cardiologist: [facility] Electrophysiologist: Dallan Schonberg  Primary Discharge Diagnosis:  1.  Persistent atrial fibrillation status post Tikosyn loading this admission  Secondary Discharge Diagnosis:  1.  Sleep disordered breathing 2.  Gastric mass    No Known Allergies   Procedures This Admission:  1.  Tikosyn loading  Brief HPI: Leonard Williams is a 71 y.o. male with a past medical history as noted above.  They were referred to EP in the outpatient setting for treatment options of atrial fibrillation.  Risks, benefits, and alternatives to Tikosyn were reviewed with the patient who wished to proceed.    Hospital Course:  The patient was admitted and Tikosyn was initiated.  Renal function and electrolytes were followed during the hospitalization.  Their QTc remained stable.  They were monitored until discharge on telemetry which demonstrated sinus rhythm.  On the day of discharge, they were examined by Dr Rayann Heman who considered them stable for discharge to home.  Follow-up has been arranged with AF clinic in 1 week and with Dr Rayann Heman in 12 weeks.   Physical Exam: Vitals:   04/25/17 1351 04/25/17 2038 04/26/17 0028 04/26/17 0629  BP: 124/68 115/70  103/70  Pulse: (!) 59 62 (!) 52 (!) 53  Resp: 18 17  16   Temp: 98.1 F (36.7 C) 98.2 F (36.8 C)  97.8 F (36.6 C)  TempSrc: Oral Oral  Oral  SpO2: 98% 100% 96% 100%  Weight:    217 lb 14.4 oz (98.8 kg)  Height:        GEN- The patient is well appearing, alert and oriented x 3 today.   HEENT: normocephalic, atraumatic; sclera clear, conjunctiva pink; hearing intact; oropharynx clear; neck supple  Lungs- Clear to ausculation bilaterally, normal work of breathing.  No wheezes, rales, rhonchi Heart- Regular  rate and rhythm  GI- soft, non-tender, non-distended, bowel sounds present Extremities- no clubbing, cyanosis, or edema  MS- no significant deformity or atrophy Skin- warm and dry, no rash or lesion Psych- euthymic mood, full affect Neuro- strength and sensation are intact   Labs:   Lab Results  Component Value Date   WBC 4.4 04/04/2017   HGB 14.9 04/04/2017   HCT 42.8 04/04/2017   MCV 87 04/04/2017   PLT 221 04/04/2017     Recent Labs Lab 04/26/17 0331  NA 140  K 5.2*  CL 105  CO2 25  BUN 16  CREATININE 1.11  CALCIUM 9.2  GLUCOSE 88     Discharge Medications:  Allergies as of 04/26/2017   No Known Allergies     Medication List    TAKE these medications   dofetilide 500 MCG capsule Commonly known as:  TIKOSYN Take 1 capsule (500 mcg total) by mouth 2 (two) times daily.   DORZOLAMIDE HCL-TIMOLOL MAL OP Place 1 drop into both eyes 2 (two) times daily.   latanoprost 0.005 % ophthalmic solution Commonly known as:  XALATAN Place 1 drop into both eyes at bedtime.   multivitamin with minerals Tabs tablet Take 1 tablet by mouth daily.   RHOPRESSA 0.02 % Soln Generic drug:  Netarsudil [name] Place 1 drop into the left eye daily.   rivaroxaban 20 MG Tabs tablet Commonly known as:  XARELTO Take 1 tablet (20 mg total) by mouth daily with supper.  topiramate 50 MG tablet Commonly known as:  TOPAMAX TAKE THREE TABLETS BY MOUTH ONCE DAILY What changed:  See the new instructions.   VIAGRA 100 MG tablet Generic drug:  sildenafil TAKE 1/2 (50mg ) TO 1 TABLET (100mg ) EVERY 24 HOURS AS NEEDED for erectile dysfunction.   Vitamin D 2000 units Caps Take 2,000 Units by mouth daily.       Disposition:  Discharge Instructions    Diet - low sodium heart healthy    Complete by:  As directed    Increase activity slowly    Complete by:  As directed      Follow-up Information    [facility] Follow up on 05/03/2017.   Specialty:   Cardiology Why:  at 9:30AM Contact information: [address] [id number] [place] [address] [phone]       Thompson Grayer, MD Follow up on 07/27/2017.   Specialty:  Cardiology Why:  at 9:30AM Contact information: [place] [address] [phone]           Duration of Discharge Encounter: Greater than 30 minutes including physician time.  Signed, [name], NP 04/26/2017 9:18 AM   I have seen, examined the patient, and reviewed the above assessment and plan.  Changes to above are made where necessary.  On exam, RRR.  QT is stable.  DC to home.  Co Sign: Thompson Grayer, MD 04/26/2017 3:44 PM

## 2017-05-03 ENCOUNTER — Encounter (HOSPITAL_COMMUNITY): Payer: Self-pay | Admitting: Nurse Practitioner

## 2017-05-03 ENCOUNTER — Ambulatory Visit (HOSPITAL_COMMUNITY)
Admit: 2017-05-03 | Discharge: 2017-05-03 | Disposition: A | Discharge status: Home / Self Care | Payer: Medicare Other | Source: Ambulatory Visit | Attending: Nurse Practitioner | Admitting: Nurse Practitioner

## 2017-05-03 VITALS — BP 122/66 | HR 63 | Ht 74.0 in | Wt 223.3 lb

## 2017-05-03 DIAGNOSIS — Z7901 Long term (current) use of anticoagulants: Secondary | ICD-10-CM | POA: Insufficient documentation

## 2017-05-03 DIAGNOSIS — Z8349 Family history of other endocrine, nutritional and metabolic diseases: Secondary | ICD-10-CM | POA: Diagnosis not present

## 2017-05-03 DIAGNOSIS — Z79899 Other long term (current) drug therapy: Secondary | ICD-10-CM | POA: Insufficient documentation

## 2017-05-03 DIAGNOSIS — M1990 Unspecified osteoarthritis, unspecified site: Secondary | ICD-10-CM | POA: Diagnosis not present

## 2017-05-03 DIAGNOSIS — H409 Unspecified glaucoma: Secondary | ICD-10-CM | POA: Diagnosis not present

## 2017-05-03 DIAGNOSIS — Z8249 Family history of ischemic heart disease and other diseases of the circulatory system: Secondary | ICD-10-CM | POA: Diagnosis not present

## 2017-05-03 DIAGNOSIS — I4819 Other persistent atrial fibrillation: Secondary | ICD-10-CM

## 2017-05-03 DIAGNOSIS — I4891 Unspecified atrial fibrillation: Secondary | ICD-10-CM | POA: Diagnosis present

## 2017-05-03 DIAGNOSIS — Z8719 Personal history of other diseases of the digestive system: Secondary | ICD-10-CM | POA: Diagnosis not present

## 2017-05-03 DIAGNOSIS — I481 Persistent atrial fibrillation: Secondary | ICD-10-CM | POA: Insufficient documentation

## 2017-05-03 LAB — BASIC METABOLIC PANEL
Anion gap: 7 (ref 5–15)
BUN: 16 mg/dL (ref 6–20)
CO2: 25 mmol/L (ref 22–32)
Calcium: 9.2 mg/dL (ref 8.9–10.3)
Chloride: 107 mmol/L (ref 101–111)
Creatinine, Ser: 1.22 mg/dL (ref 0.61–1.24)
GFR calc Af Amer: >60 mL/min (ref >60)
GFR calc non Af Amer: 58 mL/min — ABNORMAL LOW (ref >60)
Glucose, Bld: 106 mg/dL — ABNORMAL HIGH (ref 65–99)
Potassium: 4.7 mmol/L (ref 3.5–5.1)
Sodium: 139 mmol/L (ref 135–145)

## 2017-05-03 LAB — MAGNESIUM: Magnesium: 2 mg/dL (ref 1.7–2.4)

## 2017-05-03 NOTE — Progress Notes (Signed)
Primary Care Physician: [name], MD Referring Physician: Dr. Everlene Other is a 71 y.o. male with a h/o  afib that was found at the time of a wellness exam this year in August with rate bing controlled and pt fairly asymptomatic. Duration of afib unknown but thought to be persistent for months, possibly up to one year as he had not seen a MD since last yearly physical. He was seen by Cardiology who set pt up for cardioversion which was done after loading of Doac x 3 weeks with a chadsvasc score of 2(age, Recently dx pre didabetic). He did shock out but returned to afib after a few days. Pt recently lost 30 lbs over the last few months with dx of pre diabetes. Marland KitchenHe is pending a sleep study. No significant caffeine or alcohol use, no tobacco use.  He had a ETT 03/2016, which was low risk, echo showed normal heart function. Baseline EKG in afib shows LAFB as well as an EKG from 2017 in SR .He runs low 60's in SR without any AV blocking agents.  F/u in afib clinic, 10/15. I saw 10/10 and discussed options to restore SR, and pt wanted to think about options. He is in clinic today to come into hosptial for [place] admission.  F/u hospitalization 10/25. Pt is during well on Tikosyn. Feels so much better. Energy much improved.   Today, he denies symptoms of palpitations, chest pain, shortness of breath, orthopnea, PND, lower extremity edema, dizziness, presyncope, syncope, or neurologic sequela. The patient is tolerating medications without difficulties and is otherwise without complaint today.   Past Medical History:  Diagnosis Date  . Arthritis    mild arthritis- neck shoulders, more right shoulder-no problems at present.  . Atrial fibrillation, persistent ([place])   . Cancer [facility])    GI tumor- stomach tumor "nonmalignant" last check 1 yr ago.  . Glaucoma   . Headache disorder 03/08/2015   Topamax taken daily-very low grade headache to none now.  . History of colonic diverticulitis     left  . History of hiatal hernia    omeprazole as needed- "mild"  . [name] pupil [facility])    Past Surgical History:  Procedure Laterality Date  . CARDIOVERSION N/A 04/10/2017   Procedure: CARDIOVERSION;  Surgeon: Lelon Perla, MD;  Location[facility] ENDOSCOPY;  Service: Cardiovascular;  Laterality: N/A;  . COLONOSCOPY N/A 01/21/2015   Procedure: COLONOSCOPY;  Surgeon: Garlan Fair, MD;  Location: [facility] ENDOSCOPY;  Service: Endoscopy;  Laterality: N/A;-polyp removed in past-benign.  . ESOPHAGOGASTRODUODENOSCOPY (EGD) WITH PROPOFOL N/A 01/21/2015   Procedure: ESOPHAGOGASTRODUODENOSCOPY (EGD) WITH PROPOFOL;  Surgeon: Garlan Fair, MD;  Location: [facility] ENDOSCOPY;  Service: Endoscopy;  Laterality: N/A;  . EUS N/A 02/04/2015   Procedure: UPPER ENDOSCOPIC ULTRASOUND (EUS) LINEAR;  Surgeon: Milus Banister, MD;  Location: [facility] ENDOSCOPY;  Service: Endoscopy;  Laterality: N/A;  . EUS N/A 02/03/2016   Procedure: UPPER ENDOSCOPIC ULTRASOUND (EUS) RADIAL;  Surgeon: Milus Banister, MD;  Location: [facility] ENDOSCOPY;  Service: Endoscopy;  Laterality: N/A;  . EYE SURGERY     laser surgery for glaucoma  . HERNIA REPAIR Left    groin  . TONSILLECTOMY      Current Outpatient Prescriptions  Medication Sig Dispense Refill  . Cholecalciferol (VITAMIN D) 2000 units CAPS Take 2,000 Units by mouth daily.     Marland Kitchen dofetilide (TIKOSYN) 500 MCG capsule Take 1 capsule (500 mcg total) by mouth 2 (two) times daily.  180 capsule 3  . DORZOLAMIDE HCL-TIMOLOL MAL OP Place 1 drop into both eyes 2 (two) times daily.    Marland Kitchen latanoprost (XALATAN) 0.005 % ophthalmic solution Place 1 drop into both eyes at bedtime.     . Multiple Vitamin (MULTIVITAMIN WITH MINERALS) TABS tablet Take 1 tablet by mouth daily.    . RHOPRESSA 0.02 % SOLN Place 1 drop into the left eye daily.  0  . rivaroxaban (XARELTO) 20 MG TABS tablet Take 1 tablet (20 mg total) by mouth daily with supper. 30 tablet 1  . topiramate (TOPAMAX) 50 MG tablet TAKE THREE  TABLETS BY MOUTH ONCE DAILY (Patient taking differently: TAKE THREE TABLETS (150mg ) BY MOUTH ONCE DAILY) 90 tablet 3  . VIAGRA 100 MG tablet TAKE 1/2 (50mg ) TO 1 TABLET (100mg ) EVERY 24 HOURS AS NEEDED for erectile dysfunction.  0   No current facility-administered medications for this encounter.     No Known Allergies  Social History   Social History  . Marital status: Married    Spouse name: N/A  . Number of children: 3  . Years of education: BA   Occupational History  . retired    Social History Main Topics  . Smoking status: Never Smoker  . Smokeless tobacco: Never Used  . Alcohol use 1.2 - 2.4 oz/week    1 - 2 Standard drinks or equivalent, 1 - 2 Cans of beer per week     Comment: social - 1-2 beer per  week  . Drug use: No  . Sexual activity: Yes    Birth control/ protection: None   Other Topics Concern  . Not on file   Social History Narrative   Patient drinks 2 cups of caffeine daily.   Patient is right handed.    Family History  Problem Relation Age of Onset  . Glaucoma Mother   . Dementia Father   . Graves' disease Sister   . Heart disease Sister   . Migraines Sister   . Heart attack Brother   . CAD Brother     ROS- All systems are reviewed and negative except as per the HPI above  Physical Exam: Vitals:   05/03/17 0938  BP: 122/66  Pulse: 63  Weight: 223 lb 4.8 oz (101.3 kg)  Height: 6\' 2"  (1.88 m)   Wt Readings from Last 3 Encounters:  05/03/17 223 lb 4.8 oz (101.3 kg)  04/26/17 217 lb 14.4 oz (98.8 kg)  04/23/17 225 lb 9.6 oz (102.3 kg)    Labs: Lab Results  Component Value Date   NA 139 05/03/2017   K 4.7 05/03/2017   CL 107 05/03/2017   CO2 25 05/03/2017   GLUCOSE 106 (H) 05/03/2017   BUN 16 05/03/2017   CREATININE 1.22 05/03/2017   CALCIUM 9.2 05/03/2017   MG 2.0 05/03/2017   No results found for: INR No results found for: CHOL, HDL, LDLCALC, TRIG   GEN- The patient is well appearing, alert and oriented x 3 today.     Head- normocephalic, atraumatic Eyes-  Sclera clear, conjunctiva pink Ears- hearing intact Oropharynx- clear Neck- supple, no JVP Lymph- no cervical lymphadenopathy Lungs- Clear to ausculation bilaterally, normal work of breathing Heart- irregular rate and rhythm, no murmurs, rubs or gallops, PMI not laterally displaced GI- soft, NT, ND, + BS Extremities- no clubbing, cyanosis, or edema MS- no significant deformity or atrophy Skin- no rash or lesion Psych- euthymic mood, full affect Neuro- strength and sensation are intact  EKG- NSR  at 63 bpm, pr int 162 bpm, qrs int 88 ms, qtc 437 ms(stable), LAFB   Echo-Study Conclusions  - Left ventricle: The cavity size was normal. Wall thickness was   normal. Systolic function was normal. The estimated ejection   fraction was in the range of 50% to 55%. Wall motion was normal;   there were no regional wall motion abnormalities. Left   ventricular diastolic function parameters were normal. - Left atrium: The atrium was mildly dilated. Volume/bsa, ES,   (1-plane Simpson&'s, A2C): 43.4 ml/m^2. - Right atrium: The atrium was mildly dilated.  Impressions:  - Compared to the prior study, there has been no significant   interval change.     1.Assessment and Plan: Persistent  Afib, duration unknown Successful cardioversion, 10/2, but ERAF Loading of Tikosyn,  10/15 thru 10/18 Maintaining SR Tikosyn discussed  precautions for some time, exact onset of afib unknown. No use of benadryl  Qtc is stable Sleep study is pending Continue xarelto with a chadsvasc score of 2 Bmet/mag  F/u in one month  Butch Penny C. Scottlyn Mchaney, [facility] [address] [place], [place] [address] [phone]

## 2017-05-08 ENCOUNTER — Other Ambulatory Visit: Payer: Self-pay | Admitting: Internal Medicine

## 2017-05-08 ENCOUNTER — Encounter (HOSPITAL_BASED_OUTPATIENT_CLINIC_OR_DEPARTMENT_OTHER): Payer: Medicare Other

## 2017-05-08 DIAGNOSIS — N183 Chronic kidney disease, stage 3 unspecified: Secondary | ICD-10-CM

## 2017-05-08 DIAGNOSIS — R209 Unspecified disturbances of skin sensation: Secondary | ICD-10-CM | POA: Diagnosis not present

## 2017-05-08 DIAGNOSIS — Z8679 Personal history of other diseases of the circulatory system: Secondary | ICD-10-CM | POA: Diagnosis not present

## 2017-05-08 DIAGNOSIS — R7303 Prediabetes: Secondary | ICD-10-CM | POA: Diagnosis not present

## 2017-05-08 DIAGNOSIS — Z23 Encounter for immunization: Secondary | ICD-10-CM | POA: Diagnosis not present

## 2017-05-09 ENCOUNTER — Ambulatory Visit
Admission: RE | Admit: 2017-05-09 | Discharge: 2017-05-09 | Disposition: A | Discharge status: Home / Self Care | Payer: Medicare Other | Source: Ambulatory Visit | Attending: Internal Medicine | Admitting: Internal Medicine

## 2017-05-09 DIAGNOSIS — N183 Chronic kidney disease, stage 3 unspecified: Secondary | ICD-10-CM

## 2017-05-09 DIAGNOSIS — N189 Chronic kidney disease, unspecified: Secondary | ICD-10-CM | POA: Diagnosis not present

## 2017-05-15 ENCOUNTER — Ambulatory Visit (HOSPITAL_BASED_OUTPATIENT_CLINIC_OR_DEPARTMENT_OTHER): Payer: Medicare Other | Attending: Physician Assistant | Admitting: Cardiology

## 2017-05-15 DIAGNOSIS — Z79899 Other long term (current) drug therapy: Secondary | ICD-10-CM | POA: Insufficient documentation

## 2017-05-15 DIAGNOSIS — I480 Paroxysmal atrial fibrillation: Secondary | ICD-10-CM | POA: Diagnosis not present

## 2017-05-15 DIAGNOSIS — I481 Persistent atrial fibrillation: Secondary | ICD-10-CM

## 2017-05-15 DIAGNOSIS — R0683 Snoring: Secondary | ICD-10-CM

## 2017-05-15 DIAGNOSIS — I4819 Other persistent atrial fibrillation: Secondary | ICD-10-CM

## 2017-05-15 DIAGNOSIS — G4719 Other hypersomnia: Secondary | ICD-10-CM | POA: Diagnosis not present

## 2017-05-15 DIAGNOSIS — R51 Headache: Secondary | ICD-10-CM | POA: Insufficient documentation

## 2017-05-16 ENCOUNTER — Other Ambulatory Visit: Payer: Self-pay | Admitting: Cardiology

## 2017-05-23 ENCOUNTER — Ambulatory Visit (HOSPITAL_COMMUNITY)
Admission: RE | Admit: 2017-05-23 | Discharge: 2017-05-23 | Disposition: A | Discharge status: Home / Self Care | Payer: Medicare Other | Source: Ambulatory Visit | Attending: Nurse Practitioner | Admitting: Nurse Practitioner

## 2017-05-23 ENCOUNTER — Telehealth: Payer: Self-pay | Admitting: Physician Assistant

## 2017-05-23 ENCOUNTER — Telehealth: Payer: Self-pay | Admitting: *Deleted

## 2017-05-23 VITALS — BP 120/64 | HR 120

## 2017-05-23 DIAGNOSIS — K449 Diaphragmatic hernia without obstruction or gangrene: Secondary | ICD-10-CM | POA: Diagnosis not present

## 2017-05-23 DIAGNOSIS — H01132 Eczematous dermatitis of right lower eyelid: Secondary | ICD-10-CM | POA: Diagnosis not present

## 2017-05-23 DIAGNOSIS — Z8249 Family history of ischemic heart disease and other diseases of the circulatory system: Secondary | ICD-10-CM | POA: Insufficient documentation

## 2017-05-23 DIAGNOSIS — Z8489 Family history of other specified conditions: Secondary | ICD-10-CM | POA: Diagnosis not present

## 2017-05-23 DIAGNOSIS — Z9889 Other specified postprocedural states: Secondary | ICD-10-CM | POA: Diagnosis not present

## 2017-05-23 DIAGNOSIS — I481 Persistent atrial fibrillation: Secondary | ICD-10-CM

## 2017-05-23 DIAGNOSIS — Z8349 Family history of other endocrine, nutritional and metabolic diseases: Secondary | ICD-10-CM | POA: Diagnosis not present

## 2017-05-23 DIAGNOSIS — Z7901 Long term (current) use of anticoagulants: Secondary | ICD-10-CM | POA: Diagnosis not present

## 2017-05-23 DIAGNOSIS — H02135 Senile ectropion of left lower eyelid: Secondary | ICD-10-CM | POA: Diagnosis not present

## 2017-05-23 DIAGNOSIS — Z820 Family history of epilepsy and other diseases of the nervous system: Secondary | ICD-10-CM | POA: Diagnosis not present

## 2017-05-23 DIAGNOSIS — H02132 Senile ectropion of right lower eyelid: Secondary | ICD-10-CM | POA: Diagnosis not present

## 2017-05-23 DIAGNOSIS — Z79899 Other long term (current) drug therapy: Secondary | ICD-10-CM | POA: Insufficient documentation

## 2017-05-23 DIAGNOSIS — H401111 Primary open-angle glaucoma, right eye, mild stage: Secondary | ICD-10-CM | POA: Diagnosis not present

## 2017-05-23 DIAGNOSIS — I4819 Other persistent atrial fibrillation: Secondary | ICD-10-CM

## 2017-05-23 MED ORDER — DILTIAZEM HCL 30 MG PO TABS: ORAL_TABLET | ORAL | 1 refills | Status: DC

## 2017-05-23 NOTE — Telephone Encounter (Signed)
Reached out to the patient and spoke to wife (DPR) and explained that I had not received the results for his sleep study yet. Patient's wife is anxious to know what the test showed. PAP assistant assured wife (DPR) I would call as soon as I received the study report.

## 2017-05-23 NOTE — Progress Notes (Signed)
Primary Care Physician: [name], MD Referring Physician: Dr. Everlene Other is a 71 y.o. male with a h/o  afib that was found at the time of a wellness exam this year in August with rate bing controlled and pt fairly asymptomatic. Duration of afib unknown but thought to be persistent for months, possibly up to one year as he had not seen a MD since last yearly physical. He was seen by Cardiology who set pt up for cardioversion which was done after loading of Doac x 3 weeks with a chadsvasc score of 2(age, Recently dx pre didabetic). He did shock out but returned to afib after a few days. Pt recently lost 30 lbs over the last few months with dx of pre diabetes. Marland KitchenHe is pending a sleep study. No significant caffeine or alcohol use, no tobacco use. He is in the afib clinic to discuss options to return to [place].  He had a ETT 03/2016, which was low risk, echo showed normal heart function. Baseline EKG in afib shows LAFB as well as an EKG from 2017 in SR .He runs low 60's in SR without any AV blocking agents.  F/u in afib clinic, 10/15. I saw 10/10 and discussed options to restore SR, and pt wanted to thinka about options. He is in clinic today to come into hosptial for [place] admission.  F/u in afib clinic, 11/14, he initially was seen 10/25 after Tikosyn admit and was in [place]. He asked to be seen in the afib clinic today for irregularity in his heart rate. Ekg shows afib with 120 bpm.  Today, he denies symptoms of palpitations, chest pain, shortness of breath, orthopnea, PND, lower extremity edema, dizziness, presyncope, syncope, or neurologic sequela. The patient is tolerating medications without difficulties and is otherwise without complaint today.   Past Medical History:  Diagnosis Date  . Arthritis    mild arthritis- neck shoulders, more right shoulder-no problems at present.  . Atrial fibrillation, persistent ([place])   . Cancer [facility])    GI tumor- stomach tumor "nonmalignant" last  check 1 yr ago.  . Glaucoma   . Headache disorder 03/08/2015   Topamax taken daily-very low grade headache to none now.  . History of colonic diverticulitis    left  . History of hiatal hernia    omeprazole as needed- "mild"  . [name] pupil [facility])    Past Surgical History:  Procedure Laterality Date  . EYE SURGERY     laser surgery for glaucoma  . HERNIA REPAIR Left    groin  . TONSILLECTOMY      Current Outpatient Medications  Medication Sig Dispense Refill  . Cholecalciferol (VITAMIN D) 2000 units CAPS Take 2,000 Units by mouth daily.     Marland Kitchen diltiazem (CARDIZEM) 30 MG tablet Take 1 tablet every 4 hours AS NEEDED for AFIB heart rate over 100 45 tablet 1  . dofetilide (TIKOSYN) 500 MCG capsule Take 1 capsule (500 mcg total) by mouth 2 (two) times daily. 180 capsule 3  . DORZOLAMIDE HCL-TIMOLOL MAL OP Place 1 drop into both eyes 2 (two) times daily.    Marland Kitchen latanoprost (XALATAN) 0.005 % ophthalmic solution Place 1 drop into both eyes at bedtime.     . Multiple Vitamin (MULTIVITAMIN WITH MINERALS) TABS tablet Take 1 tablet by mouth daily.    . RHOPRESSA 0.02 % SOLN Place 1 drop into the left eye daily.  0  . topiramate (TOPAMAX) 50 MG tablet TAKE THREE TABLETS  BY MOUTH ONCE DAILY (Patient taking differently: TAKE THREE TABLETS (150mg ) BY MOUTH ONCE DAILY) 90 tablet 3  . VIAGRA 100 MG tablet TAKE 1/2 (50mg ) TO 1 TABLET (100mg ) EVERY 24 HOURS AS NEEDED for erectile dysfunction.  0  . XARELTO 20 MG TABS tablet TAKE 1 TABLET BY MOUTH ONCE DAILY WITH SUPPER 30 tablet 6   No current facility-administered medications for this encounter.     No Known Allergies  Social History   Socioeconomic History  . Marital status: Married    Spouse name: Not on file  . Number of children: 3  . Years of education: BA  . Highest education level: Not on file  Social Needs  . Financial resource strain: Not on file  . Food insecurity - worry: Not on file  . Food insecurity - inability: Not on file   . Transportation needs - medical: Not on file  . Transportation needs - non-medical: Not on file  Occupational History  . Occupation: retired  Tobacco Use  . Smoking status: Never Smoker  . Smokeless tobacco: Never Used  Substance and Sexual Activity  . Alcohol use: Yes    Alcohol/week: 1.2 - 2.4 oz    Types: 1 - 2 Standard drinks or equivalent, 1 - 2 Cans of beer per week    Comment: social - 1-2 beer per  week  . Drug use: No  . Sexual activity: Yes    Birth control/protection: None  Other Topics Concern  . Not on file  Social History Narrative   Patient drinks 2 cups of caffeine daily.   Patient is right handed.    Family History  Problem Relation Age of Onset  . Glaucoma Mother   . Dementia Father   . Graves' disease Sister   . Heart disease Sister   . Migraines Sister   . Heart attack Brother   . CAD Brother     ROS- All systems are reviewed and negative except as per the HPI above  Physical Exam: Vitals:   05/23/17 1422  BP: 120/64  Pulse: (!) 120   Wt Readings from Last 3 Encounters:  05/15/17 216 lb (98 kg)  05/03/17 223 lb 4.8 oz (101.3 kg)  04/26/17 217 lb 14.4 oz (98.8 kg)    Labs: Lab Results  Component Value Date   NA 139 05/03/2017   K 4.7 05/03/2017   CL 107 05/03/2017   CO2 25 05/03/2017   GLUCOSE 106 (H) 05/03/2017   BUN 16 05/03/2017   CREATININE 1.22 05/03/2017   CALCIUM 9.2 05/03/2017   MG 2.0 05/03/2017   No results found for: INR No results found for: CHOL, HDL, LDLCALC, TRIG   GEN- The patient is well appearing, alert and oriented x 3 today.   Head- normocephalic, atraumatic Eyes-  Sclera clear, conjunctiva pink Ears- hearing intact Oropharynx- clear Neck- supple, no JVP Lymph- no cervical lymphadenopathy Lungs- Clear to ausculation bilaterally, normal work of breathing Heart- irregular rate and rhythm, no murmurs, rubs or gallops, PMI not laterally displaced GI- soft, NT, ND, + BS Extremities- no clubbing, cyanosis,  or edema MS- no significant deformity or atrophy Skin- no rash or lesion Psych- euthymic mood, full affect Neuro- strength and sensation are intact  EKG- afib at 120 bpm, qrs int 88 ms, qtc 511 ms   Echo-Study Conclusions  - Left ventricle: The cavity size was normal. Wall thickness was   normal. Systolic function was normal. The estimated ejection   fraction was in the  range of 50% to 55%. Wall motion was normal;   there were no regional wall motion abnormalities. Left   ventricular diastolic function parameters were normal. - Left atrium: The atrium was mildly dilated. Volume/bsa, ES,   (1-plane Simpson&'s, A2C): 43.4 ml/m^2. - Right atrium: The atrium was mildly dilated.  Impressions:  - Compared to the prior study, there has been no significant   interval change.     1.Assessment and Plan: Persistent  Afib, Recent successful loading of Tikosyn but out of rhythm today No trigger identified Will add 30 mg cardizem for HR over 100 and BP over 100 Continue Tikosyn 500 mg bid Return Friday am for EKG No missed doses of xarelto with a chadsvasc score of 2.  Geroge Baseman [place], [facility] [address] [place], [place] [address] [phone]

## 2017-05-23 NOTE — Procedures (Signed)
   Patient Name: Leonard Williams, Leonard Williams Date: 05/15/2017 Gender: Male D.O.B: [birth year] Age (years): 49 Referring Provider: Ermalinda Barrios Height (inches): 10 Interpreting Physician: Fransico Him MD, ABSM Weight (lbs): 216 RPSGT: Laren Everts BMI: 28 MRN: [id number] Neck Size: 16.00  CLINICAL INFORMATION Sleep Study Type: NPSG  Indication for sleep study: Excessive Daytime Sleepiness, Morning Headaches, Snoring  Epworth Sleepiness Score: 8  SLEEP STUDY TECHNIQUE As per the AASM Manual for the Scoring of Sleep and Associated Events v2.3 (April 2016) with a hypopnea requiring 4% desaturations.  The channels recorded and monitored were frontal, central and occipital EEG, electrooculogram (EOG), submentalis EMG (chin), nasal and oral airflow, thoracic and abdominal wall motion, anterior tibialis EMG, snore microphone, electrocardiogram, and pulse oximetry.  MEDICATIONS Medications self-administered by patient taken the night of the study : DOFETILIDE, DORZOLAMIDE HCL-TIMOLOL, LATANOPROST, RHOPRESSA  SLEEP ARCHITECTURE The study was initiated at 11:02:21 PM and ended at 5:09:09 AM.  Sleep onset time was 13.4 minutes and the sleep efficiency was 49.8%. The total sleep time was 182.5 minutes.  Stage REM latency was 95.5 minutes.  The patient spent 10.68% of the night in stage N1 sleep, 71.23% in stage N2 sleep, 0.00% in stage N3 and 18.08% in REM.  Alpha intrusion was absent.  Supine sleep was 0.27%.  RESPIRATORY PARAMETERS The overall apnea/hypopnea index (AHI) was 0.7 per hour. There were 1 total apneas, including 0 obstructive, 1 central and 0 mixed apneas. There were 1 hypopneas and 13 RERAs.  The AHI during Stage REM sleep was 1.8 per hour.  AHI while supine was 0.0 per hour.  The mean oxygen saturation was 95.22%. The minimum SpO2 during sleep was 90.00%.  moderate snoring was noted during this study.  CARDIAC DATA The 2 lead EKG demonstrated sinus rhythm.  The mean heart rate was 69.07 beats per minute. Other EKG findings include: PACs and paroxysmal atrial fibrillation.  LEG MOVEMENT DATA The total PLMS were 0 with a resulting PLMS index of 0.00. Associated arousal with leg movement index was 0.0 .  IMPRESSIONS - No significant obstructive sleep apnea occurred during this study (AHI = 0.7/h). - No significant central sleep apnea occurred during this study (CAI = 0.3/h). - The patient had minimal or no oxygen desaturation during the study (Min O2 = 90.00%) - The patient snored with moderate snoring volume. - EKG findings include PACs and paroxysmal atrial fibrillation. - Clinically significant periodic limb movements did not occur during sleep. No significant associated arousals.  DIAGNOSIS - Normal study - Paroxysmal atrial fibrillation was noted intermittently throughout the study.   RECOMMENDATIONS - Avoid alcohol, sedatives and other CNS depressants that may worsen sleep apnea and disrupt normal sleep architecture. - Sleep hygiene should be reviewed to assess factors that may improve sleep quality. - Weight management and regular exercise should be initiated or continued if appropriate.  [place], American Board of Sleep Medicine  ELECTRONICALLY SIGNED ON:  05/23/2017, 12:44 PM [place] PH: [phone]   FX: [phone] [place]

## 2017-05-23 NOTE — Telephone Encounter (Signed)
Follow Up:    Pt would like his Sleep Study results from 05-15-17 please.

## 2017-05-23 NOTE — Progress Notes (Signed)
Pt in for EKG today.  To be reviewed by Roderic Palau, NP

## 2017-05-23 NOTE — Patient Instructions (Signed)
Your physician has recommended you make the following change in your medication:  1)Cardizem 30mg  -- take 1 tablet every 4 hours AS NEEDED for AFIB heart rate over 100 as long as the top number of blood pressure over 100.

## 2017-05-23 NOTE — Telephone Encounter (Signed)
-----   Message from Sueanne Margarita, MD sent at 05/23/2017 12:46 PM EST ----- Please let patient know that sleep study showed no significant sleep apnea.

## 2017-05-23 NOTE — Telephone Encounter (Signed)
Informed patient/wife (DPR) of sleep study results and patient understanding was verbalized. Patient/wife understands his sleep study showed no significant sleep apnea. Patient/wife understands he has no further appointments scheduled at this time. Patient/wife thanked me for the call.

## 2017-05-24 DIAGNOSIS — L245 Irritant contact dermatitis due to other chemical products: Secondary | ICD-10-CM | POA: Diagnosis not present

## 2017-05-24 DIAGNOSIS — L821 Other seborrheic keratosis: Secondary | ICD-10-CM | POA: Diagnosis not present

## 2017-05-24 DIAGNOSIS — D1801 Hemangioma of skin and subcutaneous tissue: Secondary | ICD-10-CM | POA: Diagnosis not present

## 2017-05-24 DIAGNOSIS — L570 Actinic keratosis: Secondary | ICD-10-CM | POA: Diagnosis not present

## 2017-05-25 ENCOUNTER — Ambulatory Visit (HOSPITAL_COMMUNITY)
Admission: RE | Admit: 2017-05-25 | Discharge: 2017-05-25 | Disposition: A | Discharge status: Home / Self Care | Payer: Medicare Other | Source: Ambulatory Visit | Attending: Nurse Practitioner | Admitting: Nurse Practitioner

## 2017-05-25 DIAGNOSIS — R001 Bradycardia, unspecified: Secondary | ICD-10-CM | POA: Diagnosis not present

## 2017-05-25 NOTE — Progress Notes (Signed)
Pt is in SR today. Was found to be in afib the other day. Recently loaded on tikosyn.  Returned to [place] spontaneously.Sinus brady at 59 bpm, pr int 172 ms, qrs int 88 ms, qtc 431 ms. F/u in 2 weeks as scheduled

## 2017-05-25 NOTE — Progress Notes (Signed)
Pt in for repeat EKG.  Pt states HR at home has been in 50s or 60s.  EKG to be reviewed by Roderic Palau, NP

## 2017-05-25 NOTE — Progress Notes (Signed)
Pt and wife, dpr on file, have been made aware of pts sleep study. They thanked me for the call.

## 2017-06-11 ENCOUNTER — Ambulatory Visit (HOSPITAL_COMMUNITY)
Admission: RE | Admit: 2017-06-11 | Discharge: 2017-06-11 | Disposition: A | Discharge status: Home / Self Care | Payer: Medicare Other | Source: Ambulatory Visit | Attending: Nurse Practitioner | Admitting: Nurse Practitioner

## 2017-06-11 ENCOUNTER — Encounter (HOSPITAL_COMMUNITY): Payer: Self-pay | Admitting: Nurse Practitioner

## 2017-06-11 VITALS — BP 128/66 | HR 58 | Ht 74.0 in | Wt 219.0 lb

## 2017-06-11 DIAGNOSIS — I4819 Other persistent atrial fibrillation: Secondary | ICD-10-CM

## 2017-06-11 DIAGNOSIS — Z8249 Family history of ischemic heart disease and other diseases of the circulatory system: Secondary | ICD-10-CM | POA: Diagnosis not present

## 2017-06-11 DIAGNOSIS — Z79899 Other long term (current) drug therapy: Secondary | ICD-10-CM | POA: Insufficient documentation

## 2017-06-11 DIAGNOSIS — K449 Diaphragmatic hernia without obstruction or gangrene: Secondary | ICD-10-CM | POA: Insufficient documentation

## 2017-06-11 DIAGNOSIS — I481 Persistent atrial fibrillation: Secondary | ICD-10-CM | POA: Diagnosis not present

## 2017-06-11 DIAGNOSIS — Z9889 Other specified postprocedural states: Secondary | ICD-10-CM | POA: Insufficient documentation

## 2017-06-11 DIAGNOSIS — Z7901 Long term (current) use of anticoagulants: Secondary | ICD-10-CM | POA: Diagnosis not present

## 2017-06-11 LAB — BASIC METABOLIC PANEL
Anion gap: 6 (ref 5–15)
BUN: 16 mg/dL (ref 6–20)
CO2: 24 mmol/L (ref 22–32)
Calcium: 8.9 mg/dL (ref 8.9–10.3)
Chloride: 110 mmol/L (ref 101–111)
Creatinine, Ser: 1.03 mg/dL (ref 0.61–1.24)
GFR calc Af Amer: >60 mL/min (ref >60)
GFR calc non Af Amer: >60 mL/min (ref >60)
Glucose, Bld: 113 mg/dL — ABNORMAL HIGH (ref 65–99)
Potassium: 4.2 mmol/L (ref 3.5–5.1)
Sodium: 140 mmol/L (ref 135–145)

## 2017-06-11 LAB — MAGNESIUM: Magnesium: 2 mg/dL (ref 1.7–2.4)

## 2017-06-11 NOTE — Progress Notes (Signed)
Primary Care Physician: [name], MD Referring Physician: Dr. Everlene Other is a 72 y.o. male with a h/o  afib that was found at the time of a wellness exam this year in August with rate bing controlled and pt fairly asymptomatic. Duration of afib unknown but thought to be persistent for months, possibly up to one year as he had not seen a MD since last yearly physical. He was seen by Cardiology who set pt up for cardioversion which was done after loading of Doac x 3 weeks with a chadsvasc score of 2(age, Recently dx pre didabetic). He did shock out but returned to afib after a few days. Pt recently lost 30 lbs over the last few months with dx of pre diabetes. Marland KitchenHe is pending a sleep study. No significant caffeine or alcohol use, no tobacco use. He is in the afib clinic to discuss options to return to [place].  He had a ETT 03/2016, which was low risk, echo showed normal heart function. Baseline EKG in afib shows LAFB as well as an EKG from 2017 in SR .He runs low 60's in SR without any AV blocking agents.  F/u in afib clinic, 10/15. I saw 10/10 and discussed options to restore SR, and pt wanted to thinka about options. He is in clinic today to come into hosptial for [place] admission.  F/u in afib clinic, 11/14, he initially was seen 10/25 after Tikosyn admit and was in [place]. He asked to be seen in the afib clinic today for irregularity in his heart rate. Ekg shows afib with 120 bpm. Later converted  on his own.  F/u in afib clinic for one month f/u after tikosyn. He is in [place] today. BP stable.  Today, he denies symptoms of palpitations, chest pain, shortness of breath, orthopnea, PND, lower extremity edema, dizziness, presyncope, syncope, or neurologic sequela. The patient is tolerating medications without difficulties and is otherwise without complaint today.   Past Medical History:  Diagnosis Date  . Arthritis    mild arthritis- neck shoulders, more right shoulder-no problems at  present.  . Atrial fibrillation, persistent ([place])   . Cancer [facility])    GI tumor- stomach tumor "nonmalignant" last check 1 yr ago.  . Glaucoma   . Headache disorder 03/08/2015   Topamax taken daily-very low grade headache to none now.  . History of colonic diverticulitis    left  . History of hiatal hernia    omeprazole as needed- "mild"  . [name] pupil [facility])    Past Surgical History:  Procedure Laterality Date  . CARDIOVERSION N/A 04/10/2017   Procedure: CARDIOVERSION;  Surgeon: Lelon Perla, MD;  Location: [facility] ENDOSCOPY;  Service: Cardiovascular;  Laterality: N/A;  . COLONOSCOPY N/A 01/21/2015   Procedure: COLONOSCOPY;  Surgeon: Garlan Fair, MD;  Location: [facility] ENDOSCOPY;  Service: Endoscopy;  Laterality: N/A;-polyp removed in past-benign.  . ESOPHAGOGASTRODUODENOSCOPY (EGD) WITH PROPOFOL N/A 01/21/2015   Procedure: ESOPHAGOGASTRODUODENOSCOPY (EGD) WITH PROPOFOL;  Surgeon: Garlan Fair, MD;  Location: [facility] ENDOSCOPY;  Service: Endoscopy;  Laterality: N/A;  . EUS N/A 02/04/2015   Procedure: UPPER ENDOSCOPIC ULTRASOUND (EUS) LINEAR;  Surgeon: Milus Banister, MD;  Location: [facility] ENDOSCOPY;  Service: Endoscopy;  Laterality: N/A;  . EUS N/A 02/03/2016   Procedure: UPPER ENDOSCOPIC ULTRASOUND (EUS) RADIAL;  Surgeon: Milus Banister, MD;  Location: [facility] ENDOSCOPY;  Service: Endoscopy;  Laterality: N/A;  . EYE SURGERY     laser surgery for glaucoma  .  HERNIA REPAIR Left    groin  . TONSILLECTOMY      Current Outpatient Medications  Medication Sig Dispense Refill  . Cholecalciferol (VITAMIN D) 2000 units CAPS Take 2,000 Units by mouth daily.     Marland Kitchen diltiazem (CARDIZEM) 30 MG tablet Take 1 tablet every 4 hours AS NEEDED for AFIB heart rate over 100 45 tablet 1  . dofetilide (TIKOSYN) 500 MCG capsule Take 1 capsule (500 mcg total) by mouth 2 (two) times daily. 180 capsule 3  . DORZOLAMIDE HCL-TIMOLOL MAL OP Place 1 drop into both eyes 2 (two) times daily.    Marland Kitchen latanoprost (XALATAN)  0.005 % ophthalmic solution Place 1 drop into both eyes at bedtime.     . Multiple Vitamin (MULTIVITAMIN WITH MINERALS) TABS tablet Take 1 tablet by mouth daily.    . RHOPRESSA 0.02 % SOLN Place 1 drop into the left eye daily.  0  . topiramate (TOPAMAX) 50 MG tablet TAKE THREE TABLETS BY MOUTH ONCE DAILY (Patient taking differently: TAKE THREE TABLETS (150mg ) BY MOUTH ONCE DAILY) 90 tablet 3  . VIAGRA 100 MG tablet TAKE 1/2 (50mg ) TO 1 TABLET (100mg ) EVERY 24 HOURS AS NEEDED for erectile dysfunction.  0  . XARELTO 20 MG TABS tablet TAKE 1 TABLET BY MOUTH ONCE DAILY WITH SUPPER 30 tablet 6   No current facility-administered medications for this encounter.     No Known Allergies  Social History   Socioeconomic History  . Marital status: Married    Spouse name: Not on file  . Number of children: 3  . Years of education: BA  . Highest education level: Not on file  Social Needs  . Financial resource strain: Not on file  . Food insecurity - worry: Not on file  . Food insecurity - inability: Not on file  . Transportation needs - medical: Not on file  . Transportation needs - non-medical: Not on file  Occupational History  . Occupation: retired  Tobacco Use  . Smoking status: Never Smoker  . Smokeless tobacco: Never Used  Substance and Sexual Activity  . Alcohol use: Yes    Alcohol/week: 1.2 - 2.4 oz    Types: 1 - 2 Standard drinks or equivalent, 1 - 2 Cans of beer per week    Comment: social - 1-2 beer per  week  . Drug use: No  . Sexual activity: Yes    Birth control/protection: None  Other Topics Concern  . Not on file  Social History Narrative   Patient drinks 2 cups of caffeine daily.   Patient is right handed.    Family History  Problem Relation Age of Onset  . Glaucoma Mother   . Dementia Father   . Graves' disease Sister   . Heart disease Sister   . Migraines Sister   . Heart attack Brother   . CAD Brother     ROS- All systems are reviewed and negative except  as per the HPI above  Physical Exam: Vitals:   06/11/17 1006  Weight: 219 lb (99.3 kg)  Height: 6\' 2"  (1.88 m)   Wt Readings from Last 3 Encounters:  06/11/17 219 lb (99.3 kg)  05/15/17 216 lb (98 kg)  05/03/17 223 lb 4.8 oz (101.3 kg)    Labs: Lab Results  Component Value Date   NA 139 05/03/2017   K 4.7 05/03/2017   CL 107 05/03/2017   CO2 25 05/03/2017   GLUCOSE 106 (H) 05/03/2017   BUN 16 05/03/2017  CREATININE 1.22 05/03/2017   CALCIUM 9.2 05/03/2017   MG 2.0 05/03/2017   No results found for: INR No results found for: CHOL, HDL, LDLCALC, TRIG   GEN- The patient is well appearing, alert and oriented x 3 today.   Head- normocephalic, atraumatic Eyes-  Sclera clear, conjunctiva pink Ears- hearing intact Oropharynx- clear Neck- supple, no JVP Lymph- no cervical lymphadenopathy Lungs- Clear to ausculation bilaterally, normal work of breathing Heart-  regular rate and rhythm, no murmurs, rubs or gallops, PMI not laterally displaced GI- soft, NT, ND, + BS Extremities- no clubbing, cyanosis, or edema MS- no significant deformity or atrophy Skin- no rash or lesion Psych- euthymic mood, full affect Neuro- strength and sensation are intact  EKG- Sinus brady at 58 bpm, pr int  174 bpm, qrs int 90 ms, qtc 435 ms   Echo-Study Conclusions  - Left ventricle: The cavity size was normal. Wall thickness was   normal. Systolic function was normal. The estimated ejection   fraction was in the range of 50% to 55%. Wall motion was normal;   there were no regional wall motion abnormalities. Left   ventricular diastolic function parameters were normal. - Left atrium: The atrium was mildly dilated. Volume/bsa, ES,   (1-plane Simpson&'s, A2C): 43.4 ml/m^2. - Right atrium: The atrium was mildly dilated.  Impressions:  - Compared to the prior study, there has been no significant   interval change.     1.Assessment and Plan: Persistent  Afib, Recent successful  loading of Tikosyn  IN SR today, describes some irregular heart beat but does not last long as by description sounds like PC's 30 mg cardizem for HR over 100 and BP over 100, if needed Continue Tikosyn 500 mg bid No missed doses of xarelto with a chadsvasc score of 2 Bmet/mag today  F/u with Dr. Rayann Heman 1/18  Geroge Baseman. Zilah Villaflor, [facility] [address] [place], [place] [address] [phone]

## 2017-07-27 ENCOUNTER — Ambulatory Visit: Payer: Medicare Other | Admitting: Internal Medicine

## 2017-07-27 ENCOUNTER — Encounter: Payer: Self-pay | Admitting: Internal Medicine

## 2017-07-27 VITALS — BP 112/74 | HR 68 | Ht 74.0 in | Wt 212.5 lb

## 2017-07-27 DIAGNOSIS — I481 Persistent atrial fibrillation: Secondary | ICD-10-CM | POA: Diagnosis not present

## 2017-07-27 DIAGNOSIS — I4819 Other persistent atrial fibrillation: Secondary | ICD-10-CM

## 2017-07-27 NOTE — Progress Notes (Signed)
PCP: [name], MD Primary Cardiologist: Dr Marlou Porch Primary EP: Dr [name] is a 72 y.o. male who presents today for routine electrophysiology followup.  Since last being seen in our clinic, the patient reports doing very well.  Today, he denies symptoms of palpitations, chest pain, shortness of breath,  lower extremity edema, dizziness, presyncope, or syncope.  The patient is otherwise without complaint today.   Past Medical History:  Diagnosis Date  . Arthritis    mild arthritis- neck shoulders, more right shoulder-no problems at present.  . Atrial fibrillation, persistent ([place])   . Cancer [facility])    GI tumor- stomach tumor "nonmalignant" last check 1 yr ago.  . Glaucoma   . Headache disorder 03/08/2015   Topamax taken daily-very low grade headache to none now.  . History of colonic diverticulitis    left  . History of hiatal hernia    omeprazole as needed- "mild"  . 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 CARDIOVERSION N/A 04/10/2017   Procedure: CARDIOVERSION;  Surgeon: Lelon Perla, MD;  Location: [facility] ENDOSCOPY;  Service: Cardiovascular;  Laterality: N/A;  . COLONOSCOPY N/A 01/21/2015   Procedure: COLONOSCOPY;  Surgeon: Garlan Fair, MD;  Location: [facility] ENDOSCOPY;  Service: Endoscopy;  Laterality: N/A;-polyp removed in past-benign.  . ESOPHAGOGASTRODUODENOSCOPY (EGD) WITH PROPOFOL N/A 01/21/2015   Procedure: ESOPHAGOGASTRODUODENOSCOPY (EGD) WITH PROPOFOL;  Surgeon: Garlan Fair, MD;  Location: [facility] ENDOSCOPY;  Service: Endoscopy;  Laterality: N/A;  . EUS N/A 02/04/2015   Procedure: UPPER ENDOSCOPIC ULTRASOUND (EUS) LINEAR;  Surgeon: Milus Banister, MD;  Location: [facility] ENDOSCOPY;  Service: Endoscopy;  Laterality: N/A;  . EUS N/A 02/03/2016   Procedure: UPPER ENDOSCOPIC ULTRASOUND (EUS) RADIAL;  Surgeon: Milus Banister, MD;  Location: [facility] ENDOSCOPY;  Service: Endoscopy;  Laterality: N/A;  . EYE SURGERY     laser  surgery for glaucoma  . HERNIA REPAIR Left    groin  . TONSILLECTOMY      ROS- all systems are reviewed and negatives except as per HPI above  Current Outpatient Medications  Medication Sig Dispense Refill  . Cholecalciferol (VITAMIN D) 2000 units CAPS Take 2,000 Units by mouth daily.     Marland Kitchen diltiazem (CARDIZEM) 30 MG tablet Take 1 tablet every 4 hours AS NEEDED for AFIB heart rate over 100 45 tablet 1  . dofetilide (TIKOSYN) 500 MCG capsule Take 1 capsule (500 mcg total) by mouth 2 (two) times daily. 180 capsule 3  . DORZOLAMIDE HCL-TIMOLOL MAL OP Place 1 drop into both eyes 2 (two) times daily.    Marland Kitchen latanoprost (XALATAN) 0.005 % ophthalmic solution Place 1 drop into both eyes at bedtime.     . Multiple Vitamin (MULTIVITAMIN WITH MINERALS) TABS tablet Take 1 tablet by mouth daily.    . RHOPRESSA 0.02 % SOLN Place 1 drop into the left eye daily.  0  . tacrolimus (PROTOPIC) 0.1 % ointment Prn for eyes  0  . topiramate (TOPAMAX) 50 MG tablet Take 150 mg by mouth daily. 3 tablets po once daily.    Marland Kitchen VIAGRA 100 MG tablet TAKE 1/2 (50mg ) TO 1 TABLET (100mg ) EVERY 24 HOURS AS NEEDED for erectile dysfunction.  0  . XARELTO 20 MG TABS tablet TAKE 1 TABLET BY MOUTH ONCE DAILY WITH SUPPER 30 tablet 6   No current facility-administered medications for this visit.     Physical Exam: Vitals:   07/27/17 0942  BP:  112/74  Pulse: 68  SpO2: 95%  Weight: 212 lb 8 oz (96.4 kg)  Height: 6\' 2"  (1.88 m)    GEN- The patient is well appearing, alert and oriented x 3 today.   Head- normocephalic, atraumatic Eyes-  Sclera clear, conjunctiva pink Ears- hearing intact Oropharynx- clear Lungs- Clear to ausculation bilaterally, normal work of breathing Heart- Regular rate and rhythm, no murmurs, rubs or gallops, PMI not laterally displaced GI- soft, NT, ND, + BS Extremities- no clubbing, cyanosis, or edema  EKG tracing ordered today is personally reviewed and shows sinus rhythm 59 bpm, Qtc 409 msec,  LAHB  Assessment and Plan:  1. Persistent afib Doing great with tikosyn No changes today Follow-up in AF clinic in 3 months  Thompson Grayer MD, [facility] 07/27/2017 10:23 AM

## 2017-07-27 NOTE — Patient Instructions (Addendum)
Medication Instructions:  Your physician recommends that you continue on your current medications as directed. Please refer to the Current Medication list given to you today.   Labwork: None ordered   Testing/Procedures: None ordered   Follow-Up: Your physician recommends that you schedule a follow-up appointment in: 3 months wit Roderic Palau, NP    Any Other Special Instructions Will Be Listed Below (If Applicable).     If you need a refill on your cardiac medications before your next appointment, please call your pharmacy.

## 2017-09-09 ENCOUNTER — Other Ambulatory Visit: Payer: Self-pay | Admitting: Neurology

## 2017-09-10 NOTE — Telephone Encounter (Signed)
Called pt. He states he takes topiramate 50mg  tablet daily. Not 150mg  total per day as listed. He kept rx because it was cheaper for him to do qty 68. Advised we can still send in 90 day supply, but I will change directions on rx to match how he is taking medication. He verbalized understanding.

## 2017-09-18 DIAGNOSIS — R209 Unspecified disturbances of skin sensation: Secondary | ICD-10-CM | POA: Diagnosis not present

## 2017-09-18 DIAGNOSIS — R7303 Prediabetes: Secondary | ICD-10-CM | POA: Diagnosis not present

## 2017-09-18 DIAGNOSIS — Z8679 Personal history of other diseases of the circulatory system: Secondary | ICD-10-CM | POA: Diagnosis not present

## 2017-09-18 DIAGNOSIS — N183 Chronic kidney disease, stage 3 (moderate): Secondary | ICD-10-CM | POA: Diagnosis not present

## 2017-09-20 DIAGNOSIS — H2513 Age-related nuclear cataract, bilateral: Secondary | ICD-10-CM | POA: Diagnosis not present

## 2017-09-20 DIAGNOSIS — H401111 Primary open-angle glaucoma, right eye, mild stage: Secondary | ICD-10-CM | POA: Diagnosis not present

## 2017-10-15 ENCOUNTER — Encounter: Payer: Self-pay | Admitting: Neurology

## 2017-10-15 ENCOUNTER — Other Ambulatory Visit: Payer: Self-pay

## 2017-10-15 ENCOUNTER — Ambulatory Visit: Payer: Medicare Other | Admitting: Neurology

## 2017-10-15 VITALS — BP 114/70 | HR 55 | Ht 74.0 in | Wt 206.5 lb

## 2017-10-15 DIAGNOSIS — R202 Paresthesia of skin: Secondary | ICD-10-CM

## 2017-10-15 DIAGNOSIS — R51 Headache: Secondary | ICD-10-CM

## 2017-10-15 DIAGNOSIS — R519 Headache, unspecified: Secondary | ICD-10-CM

## 2017-10-15 NOTE — Progress Notes (Signed)
Reason for visit: Migraine headache  Leonard Williams is an 72 y.o. male  History of present illness:  Leonard Williams is a 72 year old right-handed white male with a history of migraine headaches.  The patient has done very well on Topamax taking 50 mg daily.  The patient has about 1 headache a month but the headache is so mild that he does not take any medications for it.  The patient is quite pleased with his current headache frequency.  The patient is on Xarelto for atrial fibrillation.  He has recently noted within the last 6 months some numbness on the tops of the feet bilaterally, this is more noticeable at nighttime.  He denies any balance changes.  He has noted that his hemoglobin A1c has drifted up slightly into a pre-diabetic range, he is trying to watch his diet and exercise, he has lost some weight.  The patient comes into the office today for an evaluation.  He does take a multivitamin daily.  Past Medical History:  Diagnosis Date  . Arthritis    mild arthritis- neck shoulders, more right shoulder-no problems at present.  . Atrial fibrillation, persistent ([place])   . Cancer [facility])    GI tumor- stomach tumor "nonmalignant" last check 1 yr ago.  . Glaucoma   . Headache disorder 03/08/2015   Topamax taken daily-very low grade headache to none now.  . History of colonic diverticulitis    left  . History of hiatal hernia    omeprazole as needed- "mild"  . 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 CARDIOVERSION N/A 04/10/2017   Procedure: CARDIOVERSION;  Surgeon: Lelon Perla, MD;  Location: [facility] ENDOSCOPY;  Service: Cardiovascular;  Laterality: N/A;  . COLONOSCOPY N/A 01/21/2015   Procedure: COLONOSCOPY;  Surgeon: Garlan Fair, MD;  Location[facility] ENDOSCOPY;  Service: Endoscopy;  Laterality: N/A;-polyp removed in past-benign.  . ESOPHAGOGASTRODUODENOSCOPY (EGD) WITH PROPOFOL N/A 01/21/2015   Procedure: ESOPHAGOGASTRODUODENOSCOPY (EGD) WITH  PROPOFOL;  Surgeon: Garlan Fair, MD;  Location[facility] ENDOSCOPY;  Service: Endoscopy;  Laterality: N/A;  . EUS N/A 02/04/2015   Procedure: UPPER ENDOSCOPIC ULTRASOUND (EUS) LINEAR;  Surgeon: Milus Banister, MD;  Location: [facility] ENDOSCOPY;  Service: Endoscopy;  Laterality: N/A;  . EUS N/A 02/03/2016   Procedure: UPPER ENDOSCOPIC ULTRASOUND (EUS) RADIAL;  Surgeon: Milus Banister, MD;  Location: [facility] ENDOSCOPY;  Service: Endoscopy;  Laterality: N/A;  . EYE SURGERY     laser surgery for glaucoma  . HERNIA REPAIR Left    groin  . TONSILLECTOMY      Family History  Problem Relation Age of Onset  . Glaucoma Mother   . Dementia Father   . Graves' disease Sister   . Heart disease Sister   . Migraines Sister   . Heart attack Brother   . CAD Brother     Social history:  reports that he has never smoked. He has never used smokeless tobacco. He reports that he drinks about 1.2 - 2.4 oz of alcohol per week. He reports that he does not use drugs.   No Known Allergies  Medications:  Prior to Admission medications   Medication Sig Start Date End Date Taking? Authorizing Provider  Cholecalciferol (VITAMIN D) 2000 units CAPS Take 2,000 Units by mouth daily.    Yes [provider]  diltiazem (CARDIZEM) 30 MG tablet Take 1 tablet every 4 hours AS NEEDED for AFIB heart rate over 100  05/23/17  Yes Sherran Needs, NP  dofetilide (TIKOSYN) 500 MCG capsule Take 1 capsule (500 mcg total) by mouth 2 (two) times daily. 04/26/17  Yes Seiler, Amber K, NP  DORZOLAMIDE HCL-TIMOLOL MAL OP Place 1 drop into both eyes 2 (two) times daily.   Yes [provider]  latanoprost (XALATAN) 0.005 % ophthalmic solution Place 1 drop into both eyes at bedtime.    Yes [provider]  Multiple Vitamin (MULTIVITAMIN WITH MINERALS) TABS tablet Take 1 tablet by mouth daily.   Yes [provider]  RHOPRESSA 0.02 % SOLN Place 1 drop into the left eye daily. 02/20/17  Yes [provider]    tacrolimus (PROTOPIC) 0.1 % ointment Prn for eyes 05/28/17  Yes [provider]  topiramate (TOPAMAX) 50 MG tablet Take 1 tablet (50 mg total) by mouth daily. 09/10/17  Yes Kathrynn Ducking, MD  VIAGRA 100 MG tablet TAKE 1/2 (50mg ) TO 1 TABLET (100mg ) EVERY 24 HOURS AS NEEDED for erectile dysfunction. 08/09/15  Yes [provider]  XARELTO 20 MG TABS tablet TAKE 1 TABLET BY MOUTH ONCE DAILY WITH SUPPER 05/18/17  Yes Jerline Pain, MD    ROS:  Out of a complete 14 system review of symptoms, the patient complains only of the following symptoms, and all other reviewed systems are negative.  Ringing in the ears Eye redness, loss of vision Aching muscles Numbness  Blood pressure 114/70, pulse (!) 55, height 6\' 2"  (1.88 m), weight 206 lb 8 oz (93.7 kg).  Physical Exam  General: The patient is alert and cooperative at the time of the examination.  Skin: No significant peripheral edema is noted.   Neurologic Exam  Mental status: The patient is alert and oriented x 3 at the time of the examination. The patient has apparent normal recent and remote memory, with an apparently normal attention span and concentration ability.   Cranial nerves: Facial symmetry is present. Speech is normal, no aphasia or dysarthria is noted. Extraocular movements are full. Visual fields are full.  Motor: The patient has good strength in all 4 extremities.  Sensory examination: Soft touch sensation is symmetric on the face, arms, and legs.  Coordination: The patient has good finger-nose-finger and heel-to-shin bilaterally.  No clear stocking pattern pinprick sensory deficit is noted.  Gait and station: The patient has a normal gait. Tandem gait is normal. Romberg is negative. No drift is seen.  Reflexes: Deep tendon reflexes are symmetric.   Assessment/Plan:  1.  Migraine headaches  2.  Foot numbness  The patient is having new symptoms of foot numbness, we will check blood work today  looking for any treatable causes of early neuropathy.  The patient will follow-up in 1 year, sooner if needed.  He will continue the Topamax at 50 mg at night.  Leonard Alexanders MD 10/15/2017 10:24 AM  [facility] [address] [place], [place] [address]  Phone [phone] Fax [phone]

## 2017-10-18 LAB — ANA W/REFLEX: Anti Nuclear Antibody(ANA): NEGATIVE

## 2017-10-18 LAB — ANGIOTENSIN CONVERTING ENZYME: Angio Convert Enzyme: 62 U/L (ref 14–82)

## 2017-10-18 LAB — MULTIPLE MYELOMA PANEL, SERUM
Albumin SerPl Elph-Mcnc: 3.7 g/dL (ref 2.9–4.4)
Albumin/Glob SerPl: 1.4 (ref 0.7–1.7)
Alpha 1: 0.2 g/dL (ref 0.0–0.4)
Alpha2 Glob SerPl Elph-Mcnc: 0.7 g/dL (ref 0.4–1.0)
B-Globulin SerPl Elph-Mcnc: 1.2 g/dL (ref 0.7–1.3)
Gamma Glob SerPl Elph-Mcnc: 0.7 g/dL (ref 0.4–1.8)
Globulin, Total: 2.8 g/dL (ref 2.2–3.9)
IgA/Immunoglobulin A, Serum: 428 mg/dL (ref 61–437)
IgG (Immunoglobin G), Serum: 692 mg/dL — ABNORMAL LOW (ref 700–1600)
IgM (Immunoglobulin M), Srm: 76 mg/dL (ref 15–143)
Total Protein: 6.5 g/dL (ref 6.0–8.5)

## 2017-10-18 LAB — VITAMIN B12: Vitamin B-12: 533 pg/mL (ref 232–1245)

## 2017-10-18 LAB — B. BURGDORFI ANTIBODIES: Lyme IgG/IgM Ab: <0.91 {ISR} (ref 0.00–0.90)

## 2017-10-26 ENCOUNTER — Ambulatory Visit (HOSPITAL_COMMUNITY)
Admission: RE | Admit: 2017-10-26 | Discharge: 2017-10-26 | Disposition: A | Discharge status: Home / Self Care | Payer: Medicare Other | Source: Ambulatory Visit | Attending: Nurse Practitioner | Admitting: Nurse Practitioner

## 2017-10-26 ENCOUNTER — Encounter (HOSPITAL_COMMUNITY): Payer: Self-pay | Admitting: Nurse Practitioner

## 2017-10-26 VITALS — BP 116/64 | HR 55 | Ht 74.0 in | Wt 206.2 lb

## 2017-10-26 DIAGNOSIS — Z79899 Other long term (current) drug therapy: Secondary | ICD-10-CM | POA: Insufficient documentation

## 2017-10-26 DIAGNOSIS — Z7901 Long term (current) use of anticoagulants: Secondary | ICD-10-CM | POA: Insufficient documentation

## 2017-10-26 DIAGNOSIS — I481 Persistent atrial fibrillation: Secondary | ICD-10-CM | POA: Diagnosis not present

## 2017-10-26 DIAGNOSIS — I4819 Other persistent atrial fibrillation: Secondary | ICD-10-CM

## 2017-10-26 DIAGNOSIS — H409 Unspecified glaucoma: Secondary | ICD-10-CM | POA: Insufficient documentation

## 2017-10-26 LAB — BASIC METABOLIC PANEL
Anion gap: 10 (ref 5–15)
BUN: 22 mg/dL — ABNORMAL HIGH (ref 6–20)
CO2: 24 mmol/L (ref 22–32)
Calcium: 9.2 mg/dL (ref 8.9–10.3)
Chloride: 109 mmol/L (ref 101–111)
Creatinine, Ser: 1.12 mg/dL (ref 0.61–1.24)
GFR calc Af Amer: >60 mL/min (ref >60)
GFR calc non Af Amer: >60 mL/min (ref >60)
Glucose, Bld: 105 mg/dL — ABNORMAL HIGH (ref 65–99)
Potassium: 4.5 mmol/L (ref 3.5–5.1)
Sodium: 143 mmol/L (ref 135–145)

## 2017-10-26 LAB — MAGNESIUM: Magnesium: 2.2 mg/dL (ref 1.7–2.4)

## 2017-10-26 NOTE — Addendum Note (Signed)
Encounter addended by: Juluis Mire, RN on: 10/26/2017 11:39 AM  Actions taken: Order list changed

## 2017-10-26 NOTE — Progress Notes (Signed)
Primary Care Physician: [name], MD Referring Physician: Dr. 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 Duration of afib unknown but thought to be persistent for months, possibly up to one year as he had not seen a MD since last yearly physical. He was seen by Cardiology who set pt up for cardioversion which was done after loading of Doac x 3 weeks with a chadsvasc score of 2(age, Recently dx pre didabetic). He did shock out but returned to afib after a few days. Pt recently lost 30 lbs over the last few months with dx of pre diabetes. Marland KitchenHe is pending a sleep study. No significant caffeine or alcohol use, no tobacco use. He is in the afib clinic to discuss options to return to [place].  He had a ETT 03/2016, which was low risk, echo showed normal heart function. Baseline EKG in afib shows LAFB as well as an EKG from 2017 in SR .He runs low 60's in SR without any AV blocking agents.  F/u in afib clinic, 10/15. I saw 10/10 and discussed options to restore SR, and pt wanted to thinka about options. He is in clinic today to come into hosptial for [place] admission.  F/u in afib clinic, 11/14, he initially was seen 10/25 after Tikosyn admit and was in [place]. He asked to be seen in the afib clinic today for irregularity in his heart rate. Ekg shows afib with 120 bpm. Later converted  on his own.  F/u in afib clinic for one month f/u after tikosyn. He is in SR today. BP stable.  F/u in afib clinic, 4/19. He remains in SR and feels well. Continue on dofetilide and xarelto, no issues.  Today, he denies symptoms of palpitations, chest pain, shortness of breath, orthopnea, PND, lower extremity edema, dizziness, presyncope, syncope, or neurologic sequela. The patient is tolerating medications without difficulties and is otherwise without complaint today.    Past Medical History:  Diagnosis Date  . Arthritis    mild arthritis- neck shoulders, more right shoulder-no problems at present.  . Atrial fibrillation, persistent ([place])   . Cancer [facility])    GI tumor- stomach tumor "nonmalignant" last check 1 yr ago.  . Glaucoma   . Headache disorder 03/08/2015   Topamax taken daily-very low grade headache to none now.  . History of colonic diverticulitis    left  . History of hiatal hernia    omeprazole as needed- "mild"  . 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)    Past Surgical History:  Procedure Laterality Date  . CARDIOVERSION N/A 04/10/2017   Procedure: CARDIOVERSION;  Surgeon: Lelon Perla, MD;  Location: [facility] ENDOSCOPY;  Service: Cardiovascular;  Laterality: N/A;  . COLONOSCOPY N/A 01/21/2015   Procedure: COLONOSCOPY;  Surgeon: Garlan Fair, MD;  [facility] ENDOSCOPY;  Service: Endoscopy;  Laterality: N/A;-polyp removed in past-benign.  . ESOPHAGOGASTRODUODENOSCOPY (EGD) WITH PROPOFOL N/A 01/21/2015   Procedure: ESOPHAGOGASTRODUODENOSCOPY (EGD) WITH PROPOFOL;  Surgeon: Garlan Fair, MD;  Location: [facility] ENDOSCOPY;  Service: Endoscopy;  Laterality: N/A;  . EUS N/A 02/04/2015   Procedure: UPPER ENDOSCOPIC ULTRASOUND (EUS) LINEAR;  Surgeon: Milus Banister, MD;  Location: [facility] ENDOSCOPY;  Service: Endoscopy;  Laterality: N/A;  . EUS N/A 02/03/2016   Procedure: UPPER ENDOSCOPIC ULTRASOUND (EUS) RADIAL;  Surgeon: Milus Banister, MD;  Location: [facility] ENDOSCOPY;  Service: Endoscopy;  Laterality: N/A;  . EYE SURGERY     laser surgery for glaucoma  . HERNIA REPAIR Left    groin  . TONSILLECTOMY      Current Outpatient Medications  Medication Sig Dispense Refill  . Cholecalciferol (VITAMIN D) 2000 units CAPS Take 2,000 Units by mouth daily.     Marland Kitchen dofetilide (TIKOSYN) 500 MCG capsule Take 1 capsule (500 mcg total) by mouth 2 (two) times daily. 180 capsule 3  . DORZOLAMIDE HCL-TIMOLOL MAL OP Place 1 drop into both eyes 2 (two) times daily.    Marland Kitchen latanoprost  (XALATAN) 0.005 % ophthalmic solution Place 1 drop into both eyes at bedtime.     . Multiple Vitamin (MULTIVITAMIN WITH MINERALS) TABS tablet Take 1 tablet by mouth daily.    . RHOPRESSA 0.02 % SOLN Place 1 drop into the left eye daily.  0  . topiramate (TOPAMAX) 50 MG tablet Take 1 tablet (50 mg total) by mouth daily. 90 tablet 0  . VIAGRA 100 MG tablet TAKE 1/2 (50mg ) TO 1 TABLET (100mg ) EVERY 24 HOURS AS NEEDED for erectile dysfunction.  0  . XARELTO 20 MG TABS tablet TAKE 1 TABLET BY MOUTH ONCE DAILY WITH SUPPER 30 tablet 6  . diltiazem (CARDIZEM) 30 MG tablet Take 1 tablet every 4 hours AS NEEDED for AFIB heart rate over 100 (Patient not taking: Reported on 10/26/2017) 45 tablet 1  . tacrolimus (PROTOPIC) 0.1 % ointment Prn for eyes  0   No current facility-administered medications for this encounter.     No Known Allergies  Social History   Socioeconomic History  . Marital status: Married    Spouse name: Not on file  . Number of children: 3  . Years of education: BA  . Highest education level: Not on file  Occupational History  . Occupation: retired  Scientific laboratory technician  . Financial resource strain: Not on file  . Food insecurity:    Worry: Not on file    Inability: Not on file  . Transportation needs:    Medical: Not on file    Non-medical: Not on file  Tobacco Use  . Smoking status: Never Smoker  . Smokeless tobacco: Never Used  Substance and Sexual Activity  . Alcohol use: Yes    Alcohol/week: 1.2 - 2.4 oz    Types: 1 - 2 Standard drinks or equivalent, 1 - 2 Cans of beer per week    Comment: social - 1-2 beer per  week  . Drug use: No  . Sexual activity: Yes    Birth control/protection: None  Lifestyle  . Physical activity:    Days per week: Not on file    Minutes per session: Not on file  . Stress: Not on file  Relationships  . Social connections:    Talks on phone: Not on file    Gets together: Not on file    Attends religious service: Not on file    Active  member of club or organization: Not on file    Attends meetings of clubs or organizations: Not on file    Relationship status: Not on file  . Intimate partner violence:    Fear of current or ex partner: Not on file    Emotionally abused: Not on file    Physically abused: Not on file    Forced sexual activity: Not on file  Other Topics Concern  . Not on file  Social History Narrative  Patient drinks 2 cups of caffeine daily.   Patient is right handed.    Family History  Problem Relation Age of Onset  . Glaucoma Mother   . Dementia Father   . Graves' disease Sister   . Heart disease Sister   . Migraines Sister   . Heart attack Brother   . CAD Brother     ROS- All systems are reviewed and negative except as per the HPI above  Physical Exam: Vitals:   10/26/17 0945  BP: 116/64  Pulse: (!) 55  Weight: 206 lb 3.2 oz (93.5 kg)  Height: 6\' 2"  (1.88 m)   Wt Readings from Last 3 Encounters:  10/26/17 206 lb 3.2 oz (93.5 kg)  10/15/17 206 lb 8 oz (93.7 kg)  07/27/17 212 lb 8 oz (96.4 kg)    Labs: Lab Results  Component Value Date   NA 140 06/11/2017   K 4.2 06/11/2017   CL 110 06/11/2017   CO2 24 06/11/2017   GLUCOSE 113 (H) 06/11/2017   BUN 16 06/11/2017   CREATININE 1.03 06/11/2017   CALCIUM 8.9 06/11/2017   MG 2.0 06/11/2017   No results found for: INR No results found for: CHOL, HDL, LDLCALC, TRIG   GEN- The patient is well appearing, alert and oriented x 3 today.   Head- normocephalic, atraumatic Eyes-  Sclera clear, conjunctiva pink Ears- hearing intact Oropharynx- clear Neck- supple, no JVP Lymph- no cervical lymphadenopathy Lungs- Clear to ausculation bilaterally, normal work of breathing Heart-  regular rate and rhythm, no murmurs, rubs or gallops, PMI not laterally displaced GI- soft, NT, ND, + BS Extremities- no clubbing, cyanosis, or edema MS- no significant deformity or atrophy Skin- no rash or lesion Psych- euthymic mood, full affect Neuro-  strength and sensation are intact  EKG- Sinus brady at 55 bpm, pr int 184 bpm, qrs int 104 ms, qtc 428 ms   Echo-Study Conclusions  - Left ventricle: The cavity size was normal. Wall thickness was   normal. Systolic function was normal. The estimated ejection   fraction was in the range of 50% to 55%. Wall motion was normal;   there were no regional wall motion abnormalities. Left   ventricular diastolic function parameters were normal. - Left atrium: The atrium was mildly dilated. Volume/bsa, ES,   (1-plane Simpson&'s, A2C): 43.4 ml/m^2. - Right atrium: The atrium was mildly dilated.  Impressions:  - Compared to the prior study, there has been no significant   interval change.     1.Assessment and Plan: Persistent  Afib Continues in SR on dofetilide, continue 500 mcg bid 30 mg cardizem for HR over 100 and BP over 100, if needed No missed doses of xarelto with a chadsvasc score of 2 Bmet/mag today  F/u with afib clinic 3 months  Leonard Williams, [facility] [address] [place], [place] [address] [phone]

## 2017-12-07 ENCOUNTER — Telehealth: Payer: Self-pay | Admitting: Gastroenterology

## 2017-12-07 NOTE — Telephone Encounter (Signed)
The pt has been advised we will call him closer to the recall date to schedule.  The pt agreed.

## 2017-12-07 NOTE — Telephone Encounter (Signed)
Pt called looking to schedule EUS that he is due in August. Pls call him.

## 2017-12-17 ENCOUNTER — Other Ambulatory Visit: Payer: Self-pay

## 2017-12-17 ENCOUNTER — Telehealth: Payer: Self-pay

## 2017-12-17 DIAGNOSIS — D214 Benign neoplasm of connective and other soft tissue of abdomen: Secondary | ICD-10-CM

## 2017-12-17 DIAGNOSIS — K319 Disease of stomach and duodenum, unspecified: Secondary | ICD-10-CM

## 2017-12-17 NOTE — Telephone Encounter (Signed)
Recall EUS gastric lesion 02/14/18 1030 am

## 2017-12-17 NOTE — Telephone Encounter (Signed)
EUS scheduled, pt instructed and medications reviewed.  Patient instructions mailed to home.  Patient to call with any questions or concerns.  

## 2017-12-17 NOTE — Telephone Encounter (Signed)
[facility] Pre-operative Risk Assessment     Request for surgical clearance:     Endoscopy Procedure  What type of surgery is being performed?     EUS  When is this surgery scheduled?     02/14/18  What type of clearance is required ?   Pharmacy  Are there any medications that need to be held prior to surgery and how long? Xarelto  Practice name and name of physician performing surgery?      [facility]  What is your office phone and fax number?      Phone- [phone]  [phone]  Anesthesia type (None, local, MAC, general) ?       MAC

## 2017-12-18 ENCOUNTER — Other Ambulatory Visit: Payer: Self-pay | Admitting: Cardiology

## 2017-12-18 ENCOUNTER — Other Ambulatory Visit: Payer: Self-pay | Admitting: Neurology

## 2017-12-19 ENCOUNTER — Telehealth: Payer: Self-pay | Admitting: *Deleted

## 2017-12-19 MED ORDER — TOPIRAMATE 50 MG PO TABS: 50.0000 mg | ORAL_TABLET | Freq: Every day | ORAL | 3 refills | Status: DC

## 2017-12-19 NOTE — Telephone Encounter (Signed)
Xarelto 20mg  refill request received; pt is 72 yrs old, wt-93.7kg, Crea-1.12 on 10/26/17, last seen by Roderic Palau on 10/26/17, CrCl-80.45ml/min; will send in refill to requested pharmacy. Sent the Rx and the rx stated failed therefore called at 939am and spoke with Edy at the Pharmacy and she stated they have had system updates at all [facility] and this may be why it is failing to send. Therefore, she stated she would take a verbal order. Gave a verbal for Xarelto 20mg  for 30 tabs with 10 refills with instructions to take one tablet daily.

## 2017-12-19 NOTE — Telephone Encounter (Signed)
Xarelto 20mg  refill request received; pt is 72 yrs old, wt-93.7kg, Crea-1.12 on 10/26/17, last seen by Roderic Palau on 10/26/17, CrCl-80.50ml/min; will send in refill to requested pharmacy. Sent the Rx and the rx stated failed therefore called and spoke with Edy at the Pharmacy and she stated they have had system updates at all [facility] and this may be why it is failing to send. Therefore, she stated she would take a verbal order. Gave a verbal for Xarelto 20mg  for 30 tabs with 10 refills with instructions to take one tablet daily.

## 2017-12-20 NOTE — Telephone Encounter (Signed)
Pt takes Xarelto for afib with CHADS2VASc score of 2 (age, pre-diabetes). Renal function is normal. Ok to hold Xarelto for 1-2 days prior to procedure.

## 2017-12-21 NOTE — Telephone Encounter (Signed)
The pt has been advised of the ok to hold xarelto 1 day prior to procedure.  The pt has been advised of the information and verbalized understanding.

## 2017-12-24 ENCOUNTER — Other Ambulatory Visit: Payer: Self-pay | Admitting: *Deleted

## 2017-12-24 MED ORDER — TOPIRAMATE 50 MG PO TABS: 50.0000 mg | ORAL_TABLET | Freq: Every day | ORAL | 3 refills | Status: DC

## 2018-01-22 DIAGNOSIS — H1859 Other hereditary corneal dystrophies: Secondary | ICD-10-CM | POA: Diagnosis not present

## 2018-01-22 DIAGNOSIS — H2513 Age-related nuclear cataract, bilateral: Secondary | ICD-10-CM | POA: Diagnosis not present

## 2018-01-22 DIAGNOSIS — H401123 Primary open-angle glaucoma, left eye, severe stage: Secondary | ICD-10-CM | POA: Diagnosis not present

## 2018-01-22 DIAGNOSIS — H401111 Primary open-angle glaucoma, right eye, mild stage: Secondary | ICD-10-CM | POA: Diagnosis not present

## 2018-01-25 ENCOUNTER — Encounter (HOSPITAL_COMMUNITY): Payer: Self-pay | Admitting: Nurse Practitioner

## 2018-01-25 ENCOUNTER — Ambulatory Visit (HOSPITAL_COMMUNITY)
Admission: RE | Admit: 2018-01-25 | Discharge: 2018-01-25 | Disposition: A | Discharge status: Home / Self Care | Payer: Medicare Other | Source: Ambulatory Visit | Attending: Nurse Practitioner | Admitting: Nurse Practitioner

## 2018-01-25 VITALS — BP 120/72 | HR 55 | Ht 74.0 in | Wt 202.8 lb

## 2018-01-25 DIAGNOSIS — H409 Unspecified glaucoma: Secondary | ICD-10-CM | POA: Diagnosis not present

## 2018-01-25 DIAGNOSIS — M1990 Unspecified osteoarthritis, unspecified site: Secondary | ICD-10-CM | POA: Diagnosis not present

## 2018-01-25 DIAGNOSIS — I4819 Other persistent atrial fibrillation: Secondary | ICD-10-CM

## 2018-01-25 DIAGNOSIS — I481 Persistent atrial fibrillation: Secondary | ICD-10-CM | POA: Diagnosis not present

## 2018-01-25 DIAGNOSIS — Z8719 Personal history of other diseases of the digestive system: Secondary | ICD-10-CM | POA: Diagnosis not present

## 2018-01-25 DIAGNOSIS — Z7901 Long term (current) use of anticoagulants: Secondary | ICD-10-CM | POA: Insufficient documentation

## 2018-01-25 DIAGNOSIS — Z79899 Other long term (current) drug therapy: Secondary | ICD-10-CM | POA: Diagnosis not present

## 2018-01-25 DIAGNOSIS — Z8349 Family history of other endocrine, nutritional and metabolic diseases: Secondary | ICD-10-CM | POA: Diagnosis not present

## 2018-01-25 DIAGNOSIS — Z8249 Family history of ischemic heart disease and other diseases of the circulatory system: Secondary | ICD-10-CM | POA: Insufficient documentation

## 2018-01-25 DIAGNOSIS — I4891 Unspecified atrial fibrillation: Secondary | ICD-10-CM | POA: Diagnosis present

## 2018-01-25 LAB — BASIC METABOLIC PANEL
Anion gap: 6 (ref 5–15)
BUN: 14 mg/dL (ref 8–23)
CO2: 27 mmol/L (ref 22–32)
Calcium: 9.2 mg/dL (ref 8.9–10.3)
Chloride: 106 mmol/L (ref 98–111)
Creatinine, Ser: 1.11 mg/dL (ref 0.61–1.24)
GFR calc Af Amer: >60 mL/min (ref >60)
GFR calc non Af Amer: >60 mL/min (ref >60)
Glucose, Bld: 107 mg/dL — ABNORMAL HIGH (ref 70–99)
Potassium: 4.5 mmol/L (ref 3.5–5.1)
Sodium: 139 mmol/L (ref 135–145)

## 2018-01-25 LAB — MAGNESIUM: Magnesium: 2.1 mg/dL (ref 1.7–2.4)

## 2018-01-25 NOTE — Progress Notes (Signed)
Primary Care Physician: Leonard Huddle, MD Referring Physician: Dr. Marlou Williams EP: Dr. [name] is a 72 y.o. male with a h/o  afib that was found at the time of a wellness exam this year in August with rate bing controlled and pt fairly asymptomatic. Duration of afib unknown but thought to be persistent for months, possibly up to one year as he had not seen a MD since last yearly physical. He was seen by Cardiology who set pt up for cardioversion which was done after loading of Doac x 3 weeks with a chadsvasc score of 2(age, Recently dx pre didabetic). He did shock out but returned to afib after a few days. Pt recently lost 30 lbs over the last few months with dx of pre diabetes. Marland KitchenHe is pending a sleep study. No significant caffeine or alcohol use, no tobacco use. He is in the afib clinic to discuss options to return to [place].  He had a ETT 03/2016, which was low risk, echo showed normal heart function. Baseline EKG in afib shows LAFB as well as an EKG from 2017 in SR .He runs low 60's in SR without any AV blocking agents.  F/u in afib clinic, 10/15. I saw 10/10 and discussed options to restore SR, and pt wanted to thinka about options. He is in clinic today to come into hosptial for [place] admission.  F/u in afib clinic, 11/14, he initially was seen 10/25 after Leonard Williams admit and was in [place]. He asked to be seen in the afib clinic today for irregularity in his heart rate. Ekg shows afib with 120 bpm. Later converted  on his own.  F/u in afib clinic for one month f/u after Leonard Williams. He is in SR today. BP stable.  F/u in afib clinic, 4/19. He remains in SR and feels well. Continue on dofetilide and xarelto, no issues.  F/u in afib clinic for surveillance of Leonard Williams. He remains in SR, no afib noted and  feels well. He will be having an endoscopy in August and has been ok'ed per Leonard Williams, PharmD that he can stop xarelto 1-2 days before procedure.  Today, he denies symptoms of palpitations,  chest pain, shortness of breath, orthopnea, PND, lower extremity edema, dizziness, presyncope, syncope, or neurologic sequela. The patient is tolerating medications without difficulties and is otherwise without complaint today.   Past Medical History:  Diagnosis Date  . Arthritis    mild arthritis- neck shoulders, more right shoulder-no problems at present.  . Atrial fibrillation, persistent (Leonard Williams)   . Cancer Leonard Williams)    GI tumor- stomach tumor "nonmalignant" last check 1 yr ago.  . Glaucoma   . Headache disorder 03/08/2015   Topamax taken daily-very low grade headache to none now.  . History of colonic diverticulitis    left  . History of hiatal hernia    omeprazole as needed- "mild"  . Leonard Williams pupil Leonard Williams)    Past Surgical History:  Procedure Laterality Date  . CARDIOVERSION N/A 04/10/2017   Procedure: CARDIOVERSION;  Surgeon: Leonard Perla, MD;  Location: [facility] ENDOSCOPY;  Service: Cardiovascular;  Laterality: N/A;  . COLONOSCOPY N/A 01/21/2015   Procedure: COLONOSCOPY;  Surgeon: Garlan Fair, MD;  Location: [facility] ENDOSCOPY;  Service: Endoscopy;  Laterality: N/A;-polyp removed in past-benign.  . ESOPHAGOGASTRODUODENOSCOPY (EGD) WITH PROPOFOL N/A 01/21/2015   Procedure: ESOPHAGOGASTRODUODENOSCOPY (EGD) WITH PROPOFOL;  Surgeon: Garlan Fair, MD;  Location: [facility] ENDOSCOPY;  Service: Endoscopy;  Laterality: N/A;  . EUS N/A  02/04/2015   Procedure: UPPER ENDOSCOPIC ULTRASOUND (EUS) LINEAR;  Surgeon: Milus Banister, MD;  Location: [facility] ENDOSCOPY;  Service: Endoscopy;  Laterality: N/A;  . EUS N/A 02/03/2016   Procedure: UPPER ENDOSCOPIC ULTRASOUND (EUS) RADIAL;  Surgeon: Milus Banister, MD;  Location: [facility] ENDOSCOPY;  Service: Endoscopy;  Laterality: N/A;  . EYE SURGERY     laser surgery for glaucoma  . HERNIA REPAIR Left    groin  . TONSILLECTOMY      Current Outpatient Medications  Medication Sig Dispense Refill  . Cholecalciferol (VITAMIN D) 2000 units CAPS Take 2,000 Units by mouth  daily.     Marland Kitchen dofetilide (Leonard Williams) 500 MCG capsule Take 1 capsule (500 mcg total) by mouth 2 (two) times daily. 180 capsule 3  . DORZOLAMIDE HCL-TIMOLOL MAL OP Place 1 drop into both eyes 2 (two) times daily.    Marland Kitchen latanoprost (XALATAN) 0.005 % ophthalmic solution Place 1 drop into both eyes at bedtime.     . Multiple Vitamin (MULTIVITAMIN WITH MINERALS) TABS tablet Take 1 tablet by mouth daily.    . RHOPRESSA 0.02 % SOLN Place 1 drop into the left eye daily.  0  . tacrolimus (PROTOPIC) 0.1 % ointment Prn for eyes  0  . topiramate (TOPAMAX) 50 MG tablet Take 1 tablet (50 mg total) by mouth daily. 90 tablet 3  . XARELTO 20 MG TABS tablet TAKE 1 TABLET BY MOUTH ONCE DAILY WITH SUPPER 30 tablet 10  . diltiazem (CARDIZEM) 30 MG tablet Take 1 tablet every 4 hours AS NEEDED for AFIB heart rate over 100 (Patient not taking: Reported on 10/26/2017) 45 tablet 1  . VIAGRA 100 MG tablet TAKE 1/2 (50mg ) TO 1 TABLET (100mg ) EVERY 24 HOURS AS NEEDED for erectile dysfunction.  0   No current facility-administered medications for this encounter.     No Known Allergies  Social History   Socioeconomic History  . Marital status: Married    Spouse name: Not on file  . Number of children: 3  . Years of education: BA  . Highest education level: Not on file  Occupational History  . Occupation: retired  Scientific laboratory technician  . Financial resource strain: Not on file  . Food insecurity:    Worry: Not on file    Inability: Not on file  . Transportation needs:    Medical: Not on file    Non-medical: Not on file  Tobacco Use  . Smoking status: Never Smoker  . Smokeless tobacco: Never Used  Substance and Sexual Activity  . Alcohol use: Yes    Alcohol/week: 1.2 - 2.4 oz    Types: 1 - 2 Standard drinks or equivalent, 1 - 2 Cans of beer per week    Comment: social - 1-2 beer per  week  . Drug use: No  . Sexual activity: Yes    Birth control/protection: None  Lifestyle  . Physical activity:    Days per week: Not  on file    Minutes per session: Not on file  . Stress: Not on file  Relationships  . Social connections:    Talks on phone: Not on file    Gets together: Not on file    Attends religious service: Not on file    Active member of club or organization: Not on file    Attends meetings of clubs or organizations: Not on file    Relationship status: Not on file  . Intimate partner violence:    Fear of current or  ex partner: Not on file    Emotionally abused: Not on file    Physically abused: Not on file    Forced sexual activity: Not on file  Other Topics Concern  . Not on file  Social History Narrative   Patient drinks 2 cups of caffeine daily.   Patient is right handed.    Family History  Problem Relation Age of Onset  . Glaucoma Mother   . Dementia Father   . Graves' disease Sister   . Heart disease Sister   . Migraines Sister   . Heart attack Brother   . CAD Brother     ROS- All systems are reviewed and negative except as per the HPI above  Physical Exam: Vitals:   01/25/18 0945  BP: 120/72  Pulse: (!) 55  Weight: 202 lb 12.8 oz (92 kg)  Height: 6\' 2"  (1.88 m)   Wt Readings from Last 3 Encounters:  01/25/18 202 lb 12.8 oz (92 kg)  10/26/17 206 lb 3.2 oz (93.5 kg)  10/15/17 206 lb 8 oz (93.7 kg)    Labs: Lab Results  Component Value Date   NA 143 10/26/2017   K 4.5 10/26/2017   CL 109 10/26/2017   CO2 24 10/26/2017   GLUCOSE 105 (H) 10/26/2017   BUN 22 (H) 10/26/2017   CREATININE 1.12 10/26/2017   CALCIUM 9.2 10/26/2017   MG 2.2 10/26/2017   No results found for: INR No results found for: CHOL, HDL, LDLCALC, TRIG   GEN- The patient is well appearing, alert and oriented x 3 today.   Head- normocephalic, atraumatic Eyes-  Sclera clear, conjunctiva pink Ears- hearing intact Oropharynx- clear Neck- Williams, no JVP Lymph- no cervical lymphadenopathy Lungs- Clear to ausculation bilaterally, normal work of breathing Heart-  regular rate and rhythm, no  murmurs, rubs or gallops, PMI not laterally displaced GI- soft, NT, ND, + BS Extremities- no clubbing, cyanosis, or edema MS- no significant deformity or atrophy Skin- no rash or lesion Psych- euthymic mood, full affect Neuro- strength and sensation are intact  EKG- Sinus brady at 55 bpm, pr int 194 bpm, qrs int 102 ms, qtc 411 ms   Echo-Study Conclusions  - Left ventricle: The cavity size was normal. Wall thickness was   normal. Systolic function was normal. The estimated ejection   fraction was in the range of 50% to 55%. Wall motion was normal;   there were no regional wall motion abnormalities. Left   ventricular diastolic function parameters were normal. - Left atrium: The atrium was mildly dilated. Volume/bsa, ES,   (1-plane Simpson&'s, A2C): 43.4 ml/m^2. - Right atrium: The atrium was mildly dilated.  Impressions:  - Compared to the prior study, there has been no significant   interval change.   1.Assessment and Plan: Persistent  Afib Continues in SR on dofetilide, continue 500 mcg bid 30 mg cardizem for HR over 100 and BP over 100, if needed No missed doses of xarelto with a chadsvasc score of 2, can stop 1-2 days prior to endoscopy Bmet/mag today  F/u with afib clinic 3 months for Leonard Williams surveillance  Leonard Williams, [facility] [address] [place], [place] [address] [phone]

## 2018-02-06 ENCOUNTER — Encounter (HOSPITAL_COMMUNITY): Payer: Self-pay

## 2018-02-07 ENCOUNTER — Ambulatory Visit (HOSPITAL_COMMUNITY)
Admission: RE | Admit: 2018-02-07 | Discharge: 2018-02-07 | Disposition: A | Discharge status: Home / Self Care | Payer: Medicare Other | Source: Ambulatory Visit | Attending: Gastroenterology | Admitting: Gastroenterology

## 2018-02-07 ENCOUNTER — Other Ambulatory Visit: Payer: Self-pay

## 2018-02-07 ENCOUNTER — Encounter (HOSPITAL_COMMUNITY)
Admission: RE | Disposition: A | Discharge status: Home / Self Care | Payer: Self-pay | Source: Ambulatory Visit | Attending: Gastroenterology

## 2018-02-07 ENCOUNTER — Ambulatory Visit (HOSPITAL_COMMUNITY): Payer: Medicare Other | Admitting: Registered Nurse

## 2018-02-07 ENCOUNTER — Encounter (HOSPITAL_COMMUNITY): Payer: Self-pay | Admitting: Registered Nurse

## 2018-02-07 DIAGNOSIS — Z8249 Family history of ischemic heart disease and other diseases of the circulatory system: Secondary | ICD-10-CM | POA: Diagnosis not present

## 2018-02-07 DIAGNOSIS — K319 Disease of stomach and duodenum, unspecified: Secondary | ICD-10-CM | POA: Diagnosis not present

## 2018-02-07 DIAGNOSIS — K449 Diaphragmatic hernia without obstruction or gangrene: Secondary | ICD-10-CM | POA: Diagnosis not present

## 2018-02-07 DIAGNOSIS — H409 Unspecified glaucoma: Secondary | ICD-10-CM | POA: Insufficient documentation

## 2018-02-07 DIAGNOSIS — R51 Headache: Secondary | ICD-10-CM | POA: Diagnosis not present

## 2018-02-07 DIAGNOSIS — D214 Benign neoplasm of connective and other soft tissue of abdomen: Secondary | ICD-10-CM

## 2018-02-07 DIAGNOSIS — M1990 Unspecified osteoarthritis, unspecified site: Secondary | ICD-10-CM | POA: Insufficient documentation

## 2018-02-07 DIAGNOSIS — Q078 Other specified congenital malformations of nervous system: Secondary | ICD-10-CM | POA: Insufficient documentation

## 2018-02-07 DIAGNOSIS — Z820 Family history of epilepsy and other diseases of the nervous system: Secondary | ICD-10-CM | POA: Diagnosis not present

## 2018-02-07 DIAGNOSIS — I481 Persistent atrial fibrillation: Secondary | ICD-10-CM | POA: Insufficient documentation

## 2018-02-07 DIAGNOSIS — K3189 Other diseases of stomach and duodenum: Secondary | ICD-10-CM | POA: Insufficient documentation

## 2018-02-07 DIAGNOSIS — R1909 Other intra-abdominal and pelvic swelling, mass and lump: Secondary | ICD-10-CM | POA: Diagnosis present

## 2018-02-07 HISTORY — PX: EUS: SHX5427

## 2018-02-07 SURGERY — UPPER ENDOSCOPIC ULTRASOUND (EUS) RADIAL
Anesthesia: Monitor Anesthesia Care

## 2018-02-07 MED ORDER — SODIUM CHLORIDE 0.9 % IV SOLN: INTRAVENOUS | Status: DC

## 2018-02-07 MED ORDER — PROPOFOL 10 MG/ML IV BOLUS
INTRAVENOUS | Status: AC
  Filled 2018-02-07: qty 20

## 2018-02-07 MED ORDER — PROPOFOL 10 MG/ML IV BOLUS: INTRAVENOUS | Status: DC | PRN

## 2018-02-07 MED ORDER — GLYCOPYRROLATE 0.2 MG/ML IJ SOLN
INTRAMUSCULAR | Status: DC | PRN
  Administered 2018-02-07: 0.1 mg via INTRAVENOUS

## 2018-02-07 MED ORDER — LACTATED RINGERS IV SOLN
INTRAVENOUS | Status: DC
  Administered 2018-02-07: 08:00:00 via INTRAVENOUS

## 2018-02-07 MED ORDER — LIDOCAINE HCL (CARDIAC) PF 100 MG/5ML IV SOSY
PREFILLED_SYRINGE | INTRAVENOUS | Status: DC | PRN
  Administered 2018-02-07: 75 mg via INTRAVENOUS

## 2018-02-07 MED ORDER — PROPOFOL 10 MG/ML IV BOLUS
INTRAVENOUS | Status: AC
  Filled 2018-02-07: qty 40

## 2018-02-07 MED ORDER — LACTATED RINGERS IV SOLN
INTRAVENOUS | Status: DC | PRN
  Administered 2018-02-07: 08:00:00 via INTRAVENOUS

## 2018-02-07 MED ORDER — PROPOFOL 500 MG/50ML IV EMUL
INTRAVENOUS | Status: DC | PRN
  Administered 2018-02-07: 200 ug/kg/min via INTRAVENOUS

## 2018-02-07 NOTE — H&P (Signed)
HPI: This is a 72 yo man  Chief complaint is gastric lesion  EUS 2016 and 2017l; morphology typical for GIST; 11.2 to 11.62mm across  ROS: complete GI ROS as described in HPI, all other review negative.  Constitutional:  No unintentional weight loss   Past Medical History:  Diagnosis Date  . Arthritis    mild arthritis- neck shoulders, more right shoulder-no problems at present.  . Atrial fibrillation, persistent ([place])   . Cancer [facility])    GI tumor- stomach tumor "nonmalignant" last check 1 yr ago.  . Glaucoma   . Headache disorder 03/08/2015   Topamax taken daily-very low grade headache to none now.  . History of colonic diverticulitis    left  . History of hiatal hernia    omeprazole as needed- "mild"  . 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 CARDIOVERSION N/A 04/10/2017   Procedure: CARDIOVERSION;  Surgeon: Lelon Perla, MD;  Location: [facility] ENDOSCOPY;  Service: Cardiovascular;  Laterality: N/A;  . COLONOSCOPY N/A 01/21/2015   Procedure: COLONOSCOPY;  Surgeon: Garlan Fair, MD;  Location: [facility] ENDOSCOPY;  Service: Endoscopy;  Laterality: N/A;-polyp removed in past-benign.  . ESOPHAGOGASTRODUODENOSCOPY (EGD) WITH PROPOFOL N/A 01/21/2015   Procedure: ESOPHAGOGASTRODUODENOSCOPY (EGD) WITH PROPOFOL;  Surgeon: Garlan Fair, MD;  Location: [facility] ENDOSCOPY;  Service: Endoscopy;  Laterality: N/A;  . EUS N/A 02/04/2015   Procedure: UPPER ENDOSCOPIC ULTRASOUND (EUS) LINEAR;  Surgeon: Milus Banister, MD;  Location: [facility] ENDOSCOPY;  Service: Endoscopy;  Laterality: N/A;  . EUS N/A 02/03/2016   Procedure: UPPER ENDOSCOPIC ULTRASOUND (EUS) RADIAL;  Surgeon: Milus Banister, MD;  Location: [facility] ENDOSCOPY;  Service: Endoscopy;  Laterality: N/A;  . EYE SURGERY     laser surgery for glaucoma  . HERNIA REPAIR Left    groin  . TONSILLECTOMY      Current Facility-Administered Medications  Medication Dose Route Frequency Provider Last Rate Last Dose   . 0.9 %  sodium chloride infusion   Intravenous Continuous Milus Banister, MD      . lactated ringers infusion   Intravenous Continuous Milus Banister, MD 10 mL/hr at 02/07/18 0804      Allergies as of 12/17/2017  . (No Known Allergies)    Family History  Problem Relation Age of Onset  . Glaucoma Mother   . Dementia Father   . Graves' disease Sister   . Heart disease Sister   . Migraines Sister   . Heart attack Brother   . CAD Brother     Social History   Socioeconomic History  . Marital status: Married    Spouse name: Not on file  . Number of children: 3  . Years of education: BA  . Highest education level: Not on file  Occupational History  . Occupation: retired  Scientific laboratory technician  . Financial resource strain: Not on file  . Food insecurity:    Worry: Not on file    Inability: Not on file  . Transportation needs:    Medical: Not on file    Non-medical: Not on file  Tobacco Use  . Smoking status: Never Smoker  . Smokeless tobacco: Never Used  Substance and Sexual Activity  . Alcohol use: Yes    Alcohol/week: 1.2 - 2.4 oz    Types: 1 - 2 Cans of beer, 1 - 2 Standard drinks or equivalent per week    Comment: social - 1-2 beer per  week  .  Drug use: No  . Sexual activity: Yes    Birth control/protection: None  Lifestyle  . Physical activity:    Days per week: Not on file    Minutes per session: Not on file  . Stress: Not on file  Relationships  . Social connections:    Talks on phone: Not on file    Gets together: Not on file    Attends religious service: Not on file    Active member of club or organization: Not on file    Attends meetings of clubs or organizations: Not on file    Relationship status: Not on file  . Intimate partner violence:    Fear of current or ex partner: Not on file    Emotionally abused: Not on file    Physically abused: Not on file    Forced sexual activity: Not on file  Other Topics Concern  . Not on file  Social History  Narrative   Patient drinks 2 cups of caffeine daily.   Patient is right handed.     Physical Exam: BP 139/69   Pulse (!) 56   Temp 97.9 F (36.6 C) (Oral)   Resp 19   Ht 6\' 2"  (1.88 m)   Wt 200 lb (90.7 kg)   SpO2 100%   BMI 25.68 kg/m  Constitutional: generally well-appearing Psychiatric: alert and oriented x3 Abdomen: soft, nontender, nondistended, no obvious ascites, no peritoneal signs, normal bowel sounds No peripheral edema noted in lower extremities  Assessment and plan: 72 y.o. male with gastric lesion, chronic, presumed very small GIST (<2cm)  surviellance EUS today.  Please see the "Patient Instructions" section for addition details about the plan.  Owens Loffler, MD [facility] 02/07/2018, 8:14 AM

## 2018-02-07 NOTE — Anesthesia Procedure Notes (Signed)
Procedure Name: MAC Date/Time: 02/07/2018 8:35 AM Performed by: Lissa Morales, CRNA Pre-anesthesia Checklist: Patient identified, Emergency Drugs available, Suction available, Patient being monitored and Timeout performed Patient Re-evaluated:Patient Re-evaluated prior to induction Oxygen Delivery Method: Nasal cannula Placement Confirmation: positive ETCO2

## 2018-02-07 NOTE — Discharge Instructions (Signed)
YOU HAD AN ENDOSCOPIC PROCEDURE TODAY: Refer to the procedure report and other information in the discharge instructions given to you for any specific questions about what was found during the examination. If this information does not answer your questions, please call [place] office at [phone] to clarify.  ° °YOU SHOULD EXPECT: Some feelings of bloating in the abdomen. Passage of more gas than usual. Walking can help get rid of the air that was put into your GI tract during the procedure and reduce the bloating. If you had a lower endoscopy (such as a colonoscopy or flexible sigmoidoscopy) you may notice spotting of blood in your stool or on the toilet paper. Some abdominal soreness may be present for a day or two, also. ° °DIET: Your first meal following the procedure should be a light meal and then it is ok to progress to your normal diet. A half-sandwich or bowl of soup is an example of a good first meal. Heavy or fried foods are harder to digest and may make you feel nauseous or bloated. Drink plenty of fluids but you should avoid alcoholic beverages for 24 hours. If you had a esophageal dilation, please see attached instructions for diet.   ° °ACTIVITY: Your care partner should take you home directly after the procedure. You should plan to take it easy, moving slowly for the rest of the day. You can resume normal activity the day after the procedure however YOU SHOULD NOT DRIVE, use power tools, machinery or perform tasks that involve climbing or major physical exertion for 24 hours (because of the sedation medicines used during the test).  ° °SYMPTOMS TO REPORT IMMEDIATELY: °A gastroenterologist can be reached at any hour. Please call [phone]  for any of the following symptoms:  °Following lower endoscopy (colonoscopy, flexible sigmoidoscopy) °Excessive amounts of blood in the stool  °Significant tenderness, worsening of abdominal pains  °Swelling of the abdomen that is new, acute  °Fever of 100° or  higher  °Following upper endoscopy (EGD, EUS, ERCP, esophageal dilation) °Vomiting of blood or coffee ground material  °New, significant abdominal pain  °New, significant chest pain or pain under the shoulder blades  °Painful or persistently difficult swallowing  °New shortness of breath  °Black, tarry-looking or red, bloody stools ° °FOLLOW UP:  °If any biopsies were taken you will be contacted by phone or by letter within the next 1-3 weeks. Call [phone]  if you have not heard about the biopsies in 3 weeks.  °Please also call with any specific questions about appointments or follow up tests. ° °

## 2018-02-07 NOTE — Op Note (Signed)
[facility] Patient Name: Leonard Williams Procedure Date: 02/07/2018 MRN: [id number] Attending MD: Milus Banister , MD Date of Birth: October 12, 1945 CSN: [id number] Age: 72 Admit Type: Inpatient Procedure:                Upper EUS Indications:              Gastric subepithelial lesion, referred by Dr.                            Wynetta Emery 2016: EUS 2016 11.39mm lesion, presumed to                            be a very small GIST; EUS 2017 no signficant                            change, 11.31mm. Providers:                Milus Banister, MD, Burtis Junes, RN, Cherylynn Ridges,                            Technician, Enrigue Catena, CRNA Referring MD:              Medicines:                Monitored Anesthesia Care Complications:            No immediate complications. Estimated blood loss:                            None. Estimated Blood Loss:     Estimated blood loss: none. Procedure:                Pre-Anesthesia Assessment:                           - Prior to the procedure, a History and Physical                            was performed, and patient medications and                            allergies were reviewed. The patient's tolerance of                            previous anesthesia was also reviewed. The risks                            and benefits of the procedure and the sedation                            options and risks were discussed with the patient.                            All questions were answered, and informed consent  was obtained. Prior Anticoagulants: The patient has                            taken Xarelto (rivaroxaban), last dose was 2 days                            prior to procedure. ASA Grade Assessment: III - A                            patient with severe systemic disease. After                            reviewing the risks and benefits, the patient was                            deemed in satisfactory condition to undergo  the                            procedure.                           After obtaining informed consent, the endoscope was                            passed under direct vision. Throughout the                            procedure, the patient's blood pressure, pulse, and                            oxygen saturations were monitored continuously. The                            [id number] ([id number]) Olympus Radial EUS was                            introduced through the mouth, and advanced to the                            second part of duodenum. The upper EUS was                            accomplished without difficulty. The patient                            tolerated the procedure well. Scope In: Scope Out: Findings:      Endoscopic findings:      1. Normal esophagus.      2. Small subepithelial lesion in the proximal stomach with normal       overlying mucosa; unchanged by endoscopic views vs. 2017.      2. Normal duodenum.      ENDOSONOGRAPHIC FINDING: :      1. The subepithelial lesion noted above correlated with a 11.10mm mixed       cystic/solid (predominantly solid) heterogeneous, hypoechoic  mass that       directly communicates with the muscularis propria layer of the proximal       gastric wall.      2. Gastric wall was otherwise normal.      3. No perigastric adenopathy.      4. Limited views of the liver, spleen, pancreas were all normal. Impression:               - The proximal gastric subepithelial lesion is                            essentially unchaged compared to 2017 (and 2016).                            This is presumed to be a very small GIST.                           - Otherwise normal examination. Moderate Sedation:      N/A- Per Anesthesia Care Recommendation:           - Discharge patient to home (ambulatory).                           - Repeat upper EUS in 2 years with same time                            colonoscopy for polyp surveillance. Will reach out                             to [facility] to get records from his 2016                            colonoscopy with Dr. Wynetta Emery. Procedure Code(s):        --- Professional ---                           [phone], Esophagogastroduodenoscopy, flexible,                            transoral; with endoscopic ultrasound examination                            limited to the esophagus, stomach or duodenum, and                            adjacent structures Diagnosis Code(s):        --- Professional ---                           K31.89, Other diseases of stomach and duodenum CPT copyright 2017 American Medical Association. All rights reserved. The codes documented in this report are preliminary and upon coder review may  be revised to meet current compliance requirements. Milus Banister, MD 02/07/2018 9:09:10 AM This report has been signed electronically. Number of Addenda: 0

## 2018-02-07 NOTE — Transfer of Care (Signed)
Immediate Anesthesia Transfer of Care Note  Patient: Leonard Williams  Procedure(s) Performed: UPPER ENDOSCOPIC ULTRASOUND (EUS) RADIAL (N/A )  Patient Location: PACU  Anesthesia Type:MAC  Level of Consciousness: awake, alert , oriented and patient cooperative  Airway & Oxygen Therapy: Patient Spontanous Breathing and Patient connected to face mask oxygen  Post-op Assessment: Report given to RN, Post -op Vital signs reviewed and stable and Patient moving all extremities X 4  Post vital signs: stable  Last Vitals:  Vitals Value Taken Time  BP 112/58 02/07/2018  9:07 AM  Temp 36.4 C 02/07/2018  9:07 AM  Pulse 56 02/07/2018  9:07 AM  Resp 15 02/07/2018  9:07 AM  SpO2 100 % 02/07/2018  9:07 AM    Last Pain:  Vitals:   02/07/18 0907  TempSrc: Oral  PainSc: 0-No pain         Complications: No apparent anesthesia complications

## 2018-02-07 NOTE — Anesthesia Postprocedure Evaluation (Signed)
Anesthesia Post Note  Patient: Leonard Williams  Procedure(s) Performed: UPPER ENDOSCOPIC ULTRASOUND (EUS) RADIAL (N/A )     Patient location during evaluation: PACU Anesthesia Type: MAC Level of consciousness: awake and alert Pain management: pain level controlled Vital Signs Assessment: post-procedure vital signs reviewed and stable Respiratory status: spontaneous breathing, nonlabored ventilation and respiratory function stable Cardiovascular status: stable and blood pressure returned to baseline Anesthetic complications: no    Last Vitals:  Vitals:   02/07/18 0917 02/07/18 0927  BP: 107/61 115/60  Pulse: (!) 102 88  Resp: (!) 21 18  Temp:    SpO2: 92% 95%    Last Pain:  Vitals:   02/07/18 0927  TempSrc:   PainSc: 0-No pain                 [name]

## 2018-02-07 NOTE — Anesthesia Preprocedure Evaluation (Addendum)
Anesthesia Evaluation  Patient identified by MRN, date of birth, ID band Patient awake    Reviewed: Allergy & Precautions, NPO status , Patient's Chart, lab work & pertinent test results  History of Anesthesia Complications Negative for: history of anesthetic complications  Airway Mallampati: II  TM Distance: >3 FB Neck ROM: Full    Dental  (+) Dental Advisory Given   Pulmonary neg pulmonary ROS,    breath sounds clear to auscultation       Cardiovascular Exercise Tolerance: Good + dysrhythmias Atrial Fibrillation  Rhythm:Irregular Rate:Normal   '18 TTE - EF 50% to 55%. Both atria were mildly dilated.    Neuro/Psych  Headaches,  [name] Pupil  negative psych ROS   GI/Hepatic Neg liver ROS, hiatal hernia,  Gastric tumor    Endo/Other  negative endocrine ROS  Renal/GU negative Renal ROS  negative genitourinary   Musculoskeletal  (+) Arthritis ,   Abdominal   Peds  Hematology negative hematology ROS (+)   Anesthesia Other Findings   Reproductive/Obstetrics                            Anesthesia Physical Anesthesia Plan  ASA: III  Anesthesia Plan: MAC   Post-op Pain Management:    Induction: Intravenous  PONV Risk Score and Plan: 1 and Propofol infusion and Treatment may vary due to age or medical condition  Airway Management Planned: Nasal Cannula and Natural Airway  Additional Equipment: None  Intra-op Plan:   Post-operative Plan:   Informed Consent: I have reviewed the patients History and Physical, chart, labs and discussed the procedure including the risks, benefits and alternatives for the proposed anesthesia with the patient or authorized representative who has indicated his/her understanding and acceptance.     Plan Discussed with: CRNA and Anesthesiologist  Anesthesia Plan Comments:        Anesthesia Quick Evaluation

## 2018-02-08 ENCOUNTER — Telehealth: Payer: Self-pay | Admitting: Gastroenterology

## 2018-02-08 ENCOUNTER — Encounter (HOSPITAL_COMMUNITY): Payer: Self-pay | Admitting: Gastroenterology

## 2018-02-08 NOTE — Telephone Encounter (Signed)
Recall EUS for 02/2020 and colonoscopy for 01/2025 put on the  Recall list.

## 2018-02-08 NOTE — Telephone Encounter (Signed)
Colonoscopy Dr. Wynetta Emery 01/2015: indication "44mm SSA removed 2013;" findings no polyps.  Dr. Wynetta Emery recommended recall colonoscopy at 5 year interval which would be 2021 however I disagree with that recommendation. Recall should be 10 years from 2016 because the SSA was a LOW RISK finding.  He needs recall EUS in 2 years, needs recall colonoscopy 01/2025.  Thanks

## 2018-03-11 ENCOUNTER — Telehealth: Payer: Self-pay | Admitting: Physician Assistant

## 2018-03-11 NOTE — Telephone Encounter (Signed)
Patient's wife called because he had taken an extra Xarelto last night and she was unsure what to do.  She had called poison control and they just stated he should monitor himself for bleeding.  I agreed with this, nothing further needs to be done.  If he has any bleeding issues, call us back.  Otherwise, continue on the same dose of Xarelto tonight.  Patient is doing well, no other issues or concerns.  Rosaria Ferries, PA-C 03/11/2018 9:52 AM Beeper [phone]

## 2018-03-15 ENCOUNTER — Other Ambulatory Visit: Payer: Self-pay | Admitting: Internal Medicine

## 2018-03-15 ENCOUNTER — Ambulatory Visit
Admission: RE | Admit: 2018-03-15 | Discharge: 2018-03-15 | Disposition: A | Discharge status: Home / Self Care | Payer: Medicare Other | Source: Ambulatory Visit | Attending: Internal Medicine | Admitting: Internal Medicine

## 2018-03-15 DIAGNOSIS — M545 Low back pain, unspecified: Secondary | ICD-10-CM

## 2018-03-15 DIAGNOSIS — Z0001 Encounter for general adult medical examination with abnormal findings: Secondary | ICD-10-CM | POA: Diagnosis not present

## 2018-03-15 DIAGNOSIS — R209 Unspecified disturbances of skin sensation: Secondary | ICD-10-CM | POA: Diagnosis not present

## 2018-03-15 DIAGNOSIS — M436 Torticollis: Secondary | ICD-10-CM

## 2018-03-15 DIAGNOSIS — R7309 Other abnormal glucose: Secondary | ICD-10-CM | POA: Diagnosis not present

## 2018-03-15 DIAGNOSIS — M47812 Spondylosis without myelopathy or radiculopathy, cervical region: Secondary | ICD-10-CM | POA: Diagnosis not present

## 2018-03-15 DIAGNOSIS — M47814 Spondylosis without myelopathy or radiculopathy, thoracic region: Secondary | ICD-10-CM | POA: Diagnosis not present

## 2018-03-15 DIAGNOSIS — Z23 Encounter for immunization: Secondary | ICD-10-CM | POA: Diagnosis not present

## 2018-03-15 DIAGNOSIS — R7303 Prediabetes: Secondary | ICD-10-CM | POA: Diagnosis not present

## 2018-04-01 ENCOUNTER — Other Ambulatory Visit: Payer: Self-pay | Admitting: Nurse Practitioner

## 2018-04-22 ENCOUNTER — Encounter (HOSPITAL_COMMUNITY): Payer: Self-pay | Admitting: Nurse Practitioner

## 2018-04-22 ENCOUNTER — Ambulatory Visit (HOSPITAL_COMMUNITY)
Admission: RE | Admit: 2018-04-22 | Discharge: 2018-04-22 | Disposition: A | Discharge status: Home / Self Care | Payer: Medicare Other | Source: Ambulatory Visit | Attending: Nurse Practitioner | Admitting: Nurse Practitioner

## 2018-04-22 VITALS — BP 118/72 | HR 59 | Ht 74.0 in | Wt 202.0 lb

## 2018-04-22 DIAGNOSIS — Z79899 Other long term (current) drug therapy: Secondary | ICD-10-CM | POA: Diagnosis not present

## 2018-04-22 DIAGNOSIS — L723 Sebaceous cyst: Secondary | ICD-10-CM | POA: Diagnosis not present

## 2018-04-22 DIAGNOSIS — Z8249 Family history of ischemic heart disease and other diseases of the circulatory system: Secondary | ICD-10-CM | POA: Insufficient documentation

## 2018-04-22 DIAGNOSIS — L820 Inflamed seborrheic keratosis: Secondary | ICD-10-CM | POA: Diagnosis not present

## 2018-04-22 DIAGNOSIS — I4819 Other persistent atrial fibrillation: Secondary | ICD-10-CM | POA: Diagnosis not present

## 2018-04-22 DIAGNOSIS — Z7901 Long term (current) use of anticoagulants: Secondary | ICD-10-CM | POA: Diagnosis not present

## 2018-04-22 DIAGNOSIS — L821 Other seborrheic keratosis: Secondary | ICD-10-CM | POA: Diagnosis not present

## 2018-04-22 DIAGNOSIS — R7303 Prediabetes: Secondary | ICD-10-CM | POA: Insufficient documentation

## 2018-04-22 LAB — BASIC METABOLIC PANEL
Anion gap: 6 (ref 5–15)
BUN: 18 mg/dL (ref 8–23)
CO2: 26 mmol/L (ref 22–32)
Calcium: 8.9 mg/dL (ref 8.9–10.3)
Chloride: 109 mmol/L (ref 98–111)
Creatinine, Ser: 1.17 mg/dL (ref 0.61–1.24)
GFR calc Af Amer: >60 mL/min (ref >60)
GFR calc non Af Amer: >60 mL/min (ref >60)
Glucose, Bld: 118 mg/dL — ABNORMAL HIGH (ref 70–99)
Potassium: 4.8 mmol/L (ref 3.5–5.1)
Sodium: 141 mmol/L (ref 135–145)

## 2018-04-22 LAB — MAGNESIUM: Magnesium: 2.1 mg/dL (ref 1.7–2.4)

## 2018-04-22 NOTE — Progress Notes (Signed)
Primary Care Physician: [name], MD Referring Physician: Dr. 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 Duration of afib unknown but thought to be persistent for months, possibly up to one year as he had not seen a MD since last yearly physical. He was seen by Cardiology who set pt up for cardioversion which was done after loading of Doac x 3 weeks with a chadsvasc score of 2(age, Recently dx pre didabetic). He did shock out but returned to afib after a few days. Pt recently lost 30 lbs over the last few months with dx of pre diabetes. Marland KitchenHe is pending a sleep study. No significant caffeine or alcohol use, no tobacco use. He is in the afib clinic to discuss options to return to [place].  He had a ETT 03/2016, which was low risk, echo showed normal heart function. Baseline EKG in afib shows LAFB as well as an EKG from 2017 in SR .He runs low 60's in SR without any AV blocking agents.  F/u in afib clinic, 10/15. I saw 10/10 and discussed options to restore SR, and pt wanted to thinka about options. He is in clinic today to come into hosptial for [place] admission.  F/u in afib clinic, 11/14, he initially was seen 10/25 after Tikosyn admit and was in [place]. He asked to be seen in the afib clinic today for irregularity in his heart rate. Ekg shows afib with 120 bpm. Later converted  on his own.  F/u in afib clinic for one month f/u after tikosyn. He is in SR today. BP stable.  F/u in afib clinic, 4/19. He remains in SR and feels well. Continue on dofetilide and xarelto, no issues.  F/u in afib clinic for surveillance of Tikosyn. He remains in SR, no afib noted and  feels well. He will be having an endoscopy in August and has been ok'ed per M. Supple, PharmD that he can stop xarelto 1-2 days before procedure.  F/u 10/14. He feels well. He has not noted  any afib on tikosyn. No issues with eliquis.  Today, he denies symptoms of palpitations, chest pain, shortness of breath, orthopnea, PND, lower extremity edema, dizziness, presyncope, syncope, or neurologic sequela. The patient is tolerating medications without difficulties and is otherwise without complaint today.   Past Medical History:  Diagnosis Date  . Arthritis    mild arthritis- neck shoulders, more right shoulder-no problems at present.  . Atrial fibrillation, persistent   . Cancer [facility])    GI tumor- stomach tumor "nonmalignant" last check 1 yr ago.  . Glaucoma   . Headache disorder 03/08/2015   Topamax taken daily-very low grade headache to none now.  . History of colonic diverticulitis    left  . History of hiatal hernia    omeprazole as needed- "mild"  . [name] pupil [facility])    Past Surgical History:  Procedure Laterality Date  . CARDIOVERSION N/A 04/10/2017   Procedure: CARDIOVERSION;  Surgeon: Lelon Perla, MD;  Location[facility] ENDOSCOPY;  Service: Cardiovascular;  Laterality: N/A;  . COLONOSCOPY N/A 01/21/2015   Procedure: COLONOSCOPY;  Surgeon: Garlan Fair, MD;  Location: [facility] ENDOSCOPY;  Service: Endoscopy;  Laterality: N/A;-polyp removed in past-benign.  . ESOPHAGOGASTRODUODENOSCOPY (EGD) WITH PROPOFOL N/A 01/21/2015   Procedure: ESOPHAGOGASTRODUODENOSCOPY (EGD) WITH PROPOFOL;  Surgeon: Hassell Done  Sandria Senter, MD;  Location: [facility] ENDOSCOPY;  Service: Endoscopy;  Laterality: N/A;  . EUS N/A 02/04/2015   Procedure: UPPER ENDOSCOPIC ULTRASOUND (EUS) LINEAR;  Surgeon: Milus Banister, MD;  Location: [facility] ENDOSCOPY;  Service: Endoscopy;  Laterality: N/A;  . EUS N/A 02/03/2016   Procedure: UPPER ENDOSCOPIC ULTRASOUND (EUS) RADIAL;  Surgeon: Milus Banister, MD;  Location: [facility] ENDOSCOPY;  Service: Endoscopy;  Laterality: N/A;  . EUS N/A 02/07/2018   Procedure: UPPER ENDOSCOPIC ULTRASOUND (EUS) RADIAL;  Surgeon: Milus Banister, MD;  Location: [facility] ENDOSCOPY;  Service: Endoscopy;   Laterality: N/A;  . EYE SURGERY     laser surgery for glaucoma  . HERNIA REPAIR Left    groin  . TONSILLECTOMY      Current Outpatient Medications  Medication Sig Dispense Refill  . cholecalciferol (VITAMIN D) 1000 units tablet Take 1,000 Units by mouth daily.     Marland Kitchen dofetilide (TIKOSYN) 500 MCG capsule Take 1 capsule (500 mcg total) by mouth 2 (two) times daily. Please make yearly appt with Dr. Rayann Heman for January. 1st attempt 180 capsule 0  . dorzolamide-timolol (COSOPT) 22.3-6.8 MG/ML ophthalmic solution Place 1 drop into both eyes 2 (two) times daily.     Marland Kitchen latanoprost (XALATAN) 0.005 % ophthalmic solution Place 1 drop into both eyes at bedtime.     . Multiple Vitamin (MULTIVITAMIN WITH MINERALS) TABS tablet Take 1 tablet by mouth daily.    . RHOPRESSA 0.02 % SOLN Place 1 drop into the left eye daily.  0  . sildenafil (REVATIO) 20 MG tablet Take 100 mg by mouth daily as needed.  8  . tacrolimus (PROTOPIC) 0.1 % ointment Apply 1 application topically daily as needed (under eye irritation).   0  . topiramate (TOPAMAX) 50 MG tablet Take 1 tablet (50 mg total) by mouth daily. 90 tablet 3  . XARELTO 20 MG TABS tablet TAKE 1 TABLET BY MOUTH ONCE DAILY WITH SUPPER 30 tablet 10  . diltiazem (CARDIZEM) 30 MG tablet Take 1 tablet every 4 hours AS NEEDED for AFIB heart rate over 100 (Patient not taking: Reported on 10/26/2017) 45 tablet 1   No current facility-administered medications for this encounter.     No Known Allergies  Social History   Socioeconomic History  . Marital status: Married    Spouse name: Not on file  . Number of children: 3  . Years of education: BA  . Highest education level: Not on file  Occupational History  . Occupation: retired  Scientific laboratory technician  . Financial resource strain: Not on file  . Food insecurity:    Worry: Not on file    Inability: Not on file  . Transportation needs:    Medical: Not on file    Non-medical: Not on file  Tobacco Use  . Smoking  status: Never Smoker  . Smokeless tobacco: Never Used  Substance and Sexual Activity  . Alcohol use: Yes    Alcohol/week: 2.0 - 4.0 standard drinks    Types: 1 - 2 Cans of beer, 1 - 2 Standard drinks or equivalent per week    Comment: social - 1-2 beer per  week  . Drug use: No  . Sexual activity: Yes    Birth control/protection: None  Lifestyle  . Physical activity:    Days per week: Not on file    Minutes per session: Not on file  . Stress: Not on file  Relationships  . Social connections:    Talks on phone: Not  on file    Gets together: Not on file    Attends religious service: Not on file    Active member of club or organization: Not on file    Attends meetings of clubs or organizations: Not on file    Relationship status: Not on file  . Intimate partner violence:    Fear of current or ex partner: Not on file    Emotionally abused: Not on file    Physically abused: Not on file    Forced sexual activity: Not on file  Other Topics Concern  . Not on file  Social History Narrative   Patient drinks 2 cups of caffeine daily.   Patient is right handed.    Family History  Problem Relation Age of Onset  . Glaucoma Mother   . Dementia Father   . Graves' disease Sister   . Heart disease Sister   . Migraines Sister   . Heart attack Brother   . CAD Brother     ROS- All systems are reviewed and negative except as per the HPI above  Physical Exam: Vitals:   04/22/18 0944  BP: 118/72  Pulse: (!) 59  Weight: 91.6 kg  Height: 6\' 2"  (1.88 m)   Wt Readings from Last 3 Encounters:  04/22/18 91.6 kg  02/07/18 90.7 kg  01/25/18 92 kg    Labs: Lab Results  Component Value Date   NA 141 04/22/2018   K 4.8 04/22/2018   CL 109 04/22/2018   CO2 26 04/22/2018   GLUCOSE 118 (H) 04/22/2018   BUN 18 04/22/2018   CREATININE 1.17 04/22/2018   CALCIUM 8.9 04/22/2018   MG 2.1 04/22/2018   No results found for: INR No results found for: CHOL, HDL, LDLCALC, TRIG   GEN-  The patient is well appearing, alert and oriented x 3 today.   Head- normocephalic, atraumatic Eyes-  Sclera clear, conjunctiva pink Ears- hearing intact Oropharynx- clear Neck- supple, no JVP Lymph- no cervical lymphadenopathy Lungs- Clear to ausculation bilaterally, normal work of breathing Heart-  regular rate and rhythm, no murmurs, rubs or gallops, PMI not laterally displaced GI- soft, NT, ND, + BS Extremities- no clubbing, cyanosis, or edema MS- no significant deformity or atrophy Skin- no rash or lesion Psych- euthymic mood, full affect Neuro- strength and sensation are intact  EKG- Sinus brady at 59 bpm, pr int 182 bpm, qrs int 106 ms, qtc 423 ms(stable)   Echo-Study Conclusions  - Left ventricle: The cavity size was normal. Wall thickness was   normal. Systolic function was normal. The estimated ejection   fraction was in the range of 50% to 55%. Wall motion was normal;   there were no regional wall motion abnormalities. Left   ventricular diastolic function parameters were normal. - Left atrium: The atrium was mildly dilated. Volume/bsa, ES,   (1-plane Simpson&'s, A2C): 43.4 ml/m^2. - Right atrium: The atrium was mildly dilated.  Impressions:  - Compared to the prior study, there has been no significant   interval change.   1.Assessment and Plan: Persistent  Afib Continue  Dofetilide at 500 mcg bid Maintaining SR 30 mg cardizem for HR over 100 and BP over 100, if needed fro breakthrough afib Continue xarelto with a chadsvasc score of 2  Bmet/mag today  F/u with afib clinic 4 months for Tikosyn surveillance  Butch Penny C. Monda Chastain, [facility] [address] [place], [place] [address] [phone]

## 2018-04-24 ENCOUNTER — Telehealth (HOSPITAL_COMMUNITY): Payer: Self-pay | Admitting: *Deleted

## 2018-04-24 NOTE — Telephone Encounter (Signed)
Tier exception approved for doeftilide through 04/23/2019.

## 2018-05-28 DIAGNOSIS — H401111 Primary open-angle glaucoma, right eye, mild stage: Secondary | ICD-10-CM | POA: Diagnosis not present

## 2018-05-28 DIAGNOSIS — H401123 Primary open-angle glaucoma, left eye, severe stage: Secondary | ICD-10-CM | POA: Diagnosis not present

## 2018-06-21 ENCOUNTER — Other Ambulatory Visit: Payer: Self-pay | Admitting: Nurse Practitioner

## 2018-07-08 ENCOUNTER — Other Ambulatory Visit (HOSPITAL_COMMUNITY): Payer: Self-pay | Admitting: *Deleted

## 2018-07-08 MED ORDER — DOFETILIDE 500 MCG PO CAPS: 500.0000 ug | ORAL_CAPSULE | Freq: Two times a day (BID) | ORAL | 6 refills | Status: DC

## 2018-07-30 DIAGNOSIS — H401111 Primary open-angle glaucoma, right eye, mild stage: Secondary | ICD-10-CM | POA: Diagnosis not present

## 2018-07-30 DIAGNOSIS — H401123 Primary open-angle glaucoma, left eye, severe stage: Secondary | ICD-10-CM | POA: Diagnosis not present

## 2018-08-23 ENCOUNTER — Encounter (HOSPITAL_COMMUNITY): Payer: Self-pay | Admitting: Nurse Practitioner

## 2018-08-23 ENCOUNTER — Ambulatory Visit (HOSPITAL_COMMUNITY)
Admission: RE | Admit: 2018-08-23 | Discharge: 2018-08-23 | Disposition: A | Discharge status: Home / Self Care | Payer: Medicare Other | Source: Ambulatory Visit | Attending: Nurse Practitioner | Admitting: Nurse Practitioner

## 2018-08-23 VITALS — BP 110/64 | HR 56 | Ht 74.0 in | Wt 203.0 lb

## 2018-08-23 DIAGNOSIS — Z79899 Other long term (current) drug therapy: Secondary | ICD-10-CM | POA: Diagnosis not present

## 2018-08-23 DIAGNOSIS — I4819 Other persistent atrial fibrillation: Secondary | ICD-10-CM | POA: Diagnosis not present

## 2018-08-23 NOTE — Addendum Note (Signed)
Encounter addended by: Iona Hansen, CMA on: 08/23/2018 11:08 AM  Actions taken: Order list changed, Diagnosis association updated

## 2018-08-23 NOTE — Progress Notes (Signed)
Primary Care Physician: [name], MD Referring Physician: Dr. 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 Duration of afib unknown but thought to be persistent for months, possibly up to one year as he had not seen a MD since last yearly physical. He was seen by Cardiology who set pt up for cardioversion which was done after loading of Doac x 3 weeks with a chadsvasc score of 2(age, Recently dx pre didabetic). He did shock out but returned to afib after a few days. Pt recently lost 30 lbs over the last few months with dx of pre diabetes. Marland KitchenHe is pending a sleep study. No significant caffeine or alcohol use, no tobacco use. He is in the afib clinic to discuss options to return to [place].  He had a ETT 03/2016, which was low risk, echo showed normal heart function. Baseline EKG in afib shows LAFB as well as an EKG from 2017 in SR .He runs low 60's in SR without any AV blocking agents.  F/u in afib clinic, 10/15. I saw 10/10 and discussed options to restore SR, and pt wanted to thinka about options. He is in clinic today to come into hosptial for [place] admission.  F/u in afib clinic, 11/14, he initially was seen 10/25 after Tikosyn admit and was in [place]. He asked to be seen in the afib clinic today for irregularity in his heart rate. Ekg shows afib with 120 bpm. Later converted  on his own.  F/u in afib clinic for one month f/u after tikosyn. He is in SR today. BP stable.  F/u in afib clinic, 4/19. He remains in SR and feels well. Continue on dofetilide and xarelto, no issues.  F/u in afib clinic for surveillance of Tikosyn. He remains in SR, no afib noted and  feels well. He will be having an endoscopy in August and has been ok'ed per M. Supple, PharmD that he can stop xarelto 1-2 days before procedure.  F/u 10/14. He feels well. He has not noted  any afib on tikosyn. No issues with eliquis.  F/u in afib clinic 2/14. He continues on Tikosyn and is doing well, no afib reported. No issues with xarelto. qtc stable.  Today, he denies symptoms of palpitations, chest pain, shortness of breath, orthopnea, PND, lower extremity edema, dizziness, presyncope, syncope, or neurologic sequela. The patient is tolerating medications without difficulties and is otherwise without complaint today.   Past Medical History:  Diagnosis Date  . Arthritis    mild arthritis- neck shoulders, more right shoulder-no problems at present.  . Atrial fibrillation, persistent   . Cancer [facility])    GI tumor- stomach tumor "nonmalignant" last check 1 yr ago.  . Glaucoma   . Headache disorder 03/08/2015   Topamax taken daily-very low grade headache to none now.  . History of colonic diverticulitis    left  . History of hiatal hernia    omeprazole as needed- "mild"  . [name] pupil [facility])    Past Surgical History:  Procedure Laterality Date  . CARDIOVERSION N/A 04/10/2017   Procedure: CARDIOVERSION;  Surgeon: Lelon Perla, MD;  Location: [facility] ENDOSCOPY;  Service: Cardiovascular;  Laterality: N/A;  . COLONOSCOPY N/A 01/21/2015   Procedure: COLONOSCOPY;  Surgeon: Garlan Fair, MD;  Location[facility] ENDOSCOPY;  Service: Endoscopy;  Laterality: N/A;-polyp removed in past-benign.  . ESOPHAGOGASTRODUODENOSCOPY (EGD) WITH PROPOFOL N/A 01/21/2015   Procedure: ESOPHAGOGASTRODUODENOSCOPY (EGD) WITH PROPOFOL;  Surgeon: Garlan Fair, MD;  Location: [facility] ENDOSCOPY;  Service: Endoscopy;  Laterality: N/A;  . EUS N/A 02/04/2015   Procedure: UPPER ENDOSCOPIC ULTRASOUND (EUS) LINEAR;  Surgeon: Milus Banister, MD;  Location: [facility] ENDOSCOPY;  Service: Endoscopy;  Laterality: N/A;  . EUS N/A 02/03/2016   Procedure: UPPER ENDOSCOPIC ULTRASOUND (EUS) RADIAL;  Surgeon: Milus Banister, MD;  Location: [facility] ENDOSCOPY;  Service: Endoscopy;  Laterality: N/A;  . EUS N/A 02/07/2018   Procedure:  UPPER ENDOSCOPIC ULTRASOUND (EUS) RADIAL;  Surgeon: Milus Banister, MD;  Location: [facility] ENDOSCOPY;  Service: Endoscopy;  Laterality: N/A;  . EYE SURGERY     laser surgery for glaucoma  . HERNIA REPAIR Left    groin  . TONSILLECTOMY      Current Outpatient Medications  Medication Sig Dispense Refill  . cholecalciferol (VITAMIN D) 1000 units tablet Take 1,000 Units by mouth daily.     Marland Kitchen dofetilide (TIKOSYN) 500 MCG capsule Take 1 capsule (500 mcg total) by mouth 2 (two) times daily. 60 capsule 6  . dorzolamide-timolol (COSOPT) 22.3-6.8 MG/ML ophthalmic solution Place 1 drop into both eyes 2 (two) times daily.     Marland Kitchen latanoprost (XALATAN) 0.005 % ophthalmic solution Place 1 drop into both eyes at bedtime.     . Multiple Vitamin (MULTIVITAMIN WITH MINERALS) TABS tablet Take 1 tablet by mouth daily.    . RHOPRESSA 0.02 % SOLN Place 1 drop into both eyes daily.   0  . sildenafil (REVATIO) 20 MG tablet Take 100 mg by mouth daily as needed.  8  . tacrolimus (PROTOPIC) 0.1 % ointment Apply 1 application topically daily as needed (under eye irritation).   0  . topiramate (TOPAMAX) 50 MG tablet Take 1 tablet (50 mg total) by mouth daily. 90 tablet 3  . XARELTO 20 MG TABS tablet TAKE 1 TABLET BY MOUTH ONCE DAILY WITH SUPPER 30 tablet 10  . diltiazem (CARDIZEM) 30 MG tablet Take 1 tablet every 4 hours AS NEEDED for AFIB heart rate over 100 (Patient not taking: Reported on 10/26/2017) 45 tablet 1   No current facility-administered medications for this encounter.     No Known Allergies  Social History   Socioeconomic History  . Marital status: Married    Spouse name: Not on file  . Number of children: 3  . Years of education: BA  . Highest education level: Not on file  Occupational History  . Occupation: retired  Scientific laboratory technician  . Financial resource strain: Not on file  . Food insecurity:    Worry: Not on file    Inability: Not on file  . Transportation needs:    Medical: Not on file     Non-medical: Not on file  Tobacco Use  . Smoking status: Never Smoker  . Smokeless tobacco: Never Used  Substance and Sexual Activity  . Alcohol use: Yes    Alcohol/week: 2.0 - 4.0 standard drinks    Types: 1 - 2 Cans of beer, 1 - 2 Standard drinks or equivalent per week    Comment: social - 1-2 beer per  week  . Drug use: No  . Sexual activity: Yes    Birth control/protection: None  Lifestyle  . Physical activity:    Days per week: Not on file    Minutes per session: Not on file  . Stress: Not on file  Relationships  . Social connections:    Talks on phone: Not on file    Gets together: Not on file    Attends religious service: Not on file    Active member of club or organization: Not on file    Attends meetings of clubs or organizations: Not on file    Relationship status: Not on file  . Intimate partner violence:    Fear of current or ex partner: Not on file    Emotionally abused: Not on file    Physically abused: Not on file    Forced sexual activity: Not on file  Other Topics Concern  . Not on file  Social History Narrative   Patient drinks 2 cups of caffeine daily.   Patient is right handed.    Family History  Problem Relation Age of Onset  . Glaucoma Mother   . Dementia Father   . Graves' disease Sister   . Heart disease Sister   . Migraines Sister   . Heart attack Brother   . CAD Brother     ROS- All systems are reviewed and negative except as per the HPI above  Physical Exam: Vitals:   08/23/18 0939  BP: 110/64  Pulse: (!) 56  Weight: 92.1 kg  Height: 6\' 2"  (1.88 m)   Wt Readings from Last 3 Encounters:  08/23/18 92.1 kg  04/22/18 91.6 kg  02/07/18 90.7 kg    Labs: Lab Results  Component Value Date   NA 141 04/22/2018   K 4.8 04/22/2018   CL 109 04/22/2018   CO2 26 04/22/2018   GLUCOSE 118 (H) 04/22/2018   BUN 18 04/22/2018   CREATININE 1.17 04/22/2018   CALCIUM 8.9 04/22/2018   MG 2.1 04/22/2018   No results found for: INR No  results found for: CHOL, HDL, LDLCALC, TRIG   GEN- The patient is well appearing, alert and oriented x 3 today.   Head- normocephalic, atraumatic Eyes-  Sclera clear, conjunctiva pink Ears- hearing intact Oropharynx- clear Neck- supple, no JVP Lymph- no cervical lymphadenopathy Lungs- Clear to ausculation bilaterally, normal work of breathing Heart-  regular rate and rhythm, no murmurs, rubs or gallops, PMI not laterally displaced GI- soft, NT, ND, + BS Extremities- no clubbing, cyanosis, or edema MS- no significant deformity or atrophy Skin- no rash or lesion Psych- euthymic mood, full affect Neuro- strength and sensation are intact  EKG- Sinus brady at 56 bpm, pr int 188 bpm, qrs int 104 ms, qtc 403 ms(stable)   Echo-Study Conclusions  - Left ventricle: The cavity size was normal. Wall thickness was   normal. Systolic function was normal. The estimated ejection   fraction was in the range of 50% to 55%. Wall motion was normal;   there were no regional wall motion abnormalities. Left   ventricular diastolic function parameters were normal. - Left atrium: The atrium was mildly dilated. Volume/bsa, ES,   (1-plane Simpson&'s, A2C): 43.4 ml/m^2. - Right atrium: The atrium was mildly dilated.  Impressions:  - Compared to the prior study, there has been no significant   interval change.   1.Assessment and Plan: Persistent  Afib Continue  Dofetilide at 500 mcg bid Maintaining SR 30 mg cardizem if needed, but no episodes reported Continue xarelto with a chadsvasc score of 2  Bmet/mag today  F/u with afib clinic 6 months for Tikosyn surveillance  Butch Penny C. Damariz Paganelli, [facility] [address] [place], [place] [address] [phone]

## 2018-09-09 ENCOUNTER — Encounter (HOSPITAL_COMMUNITY): Payer: Self-pay

## 2018-09-10 ENCOUNTER — Telehealth (HOSPITAL_COMMUNITY): Payer: Self-pay | Admitting: *Deleted

## 2018-09-10 NOTE — Telephone Encounter (Signed)
Error

## 2018-09-13 ENCOUNTER — Ambulatory Visit (HOSPITAL_COMMUNITY)
Admission: RE | Admit: 2018-09-13 | Discharge: 2018-09-13 | Disposition: A | Discharge status: Home / Self Care | Payer: Medicare Other | Source: Ambulatory Visit | Attending: Nurse Practitioner | Admitting: Nurse Practitioner

## 2018-09-13 DIAGNOSIS — I4819 Other persistent atrial fibrillation: Secondary | ICD-10-CM | POA: Insufficient documentation

## 2018-09-13 LAB — BASIC METABOLIC PANEL
Anion gap: 6 (ref 5–15)
BUN: 18 mg/dL (ref 8–23)
CO2: 26 mmol/L (ref 22–32)
Calcium: 9 mg/dL (ref 8.9–10.3)
Chloride: 107 mmol/L (ref 98–111)
Creatinine, Ser: 1.21 mg/dL (ref 0.61–1.24)
GFR calc Af Amer: >60 mL/min (ref >60)
GFR calc non Af Amer: 60 mL/min — ABNORMAL LOW (ref >60)
Glucose, Bld: 110 mg/dL — ABNORMAL HIGH (ref 70–99)
Potassium: 4.5 mmol/L (ref 3.5–5.1)
Sodium: 139 mmol/L (ref 135–145)

## 2018-09-13 LAB — MAGNESIUM: Magnesium: 2.2 mg/dL (ref 1.7–2.4)

## 2018-10-04 ENCOUNTER — Other Ambulatory Visit: Payer: Self-pay | Admitting: Cardiology

## 2018-10-07 NOTE — Telephone Encounter (Signed)
Age 73, weight 92kg, SCr 1.21 from 09/13/18. CrCl 61mL/min. Last seen in afib clinic 08/23/18.

## 2018-10-16 ENCOUNTER — Telehealth: Payer: Self-pay

## 2018-10-16 NOTE — Telephone Encounter (Signed)
Pt understands that although there may be some limitations with this type of visit, we will take all precautions to reduce any security or privacy concerns.  Pt understands that this will be treated like an in office visit and we will file with pt's insurance, and there may be a patient responsible charge related to this service.   I have completed the pre charting for this visit.

## 2018-10-16 NOTE — Telephone Encounter (Signed)
Attempted to reach the pt to complete the pre charting for tomorrows 10/17/18 appt. PT's advised to have pt call back back. Trying to verify if any changes in his medications or allergies has taken place and confirm current pharmacy.

## 2018-10-17 ENCOUNTER — Other Ambulatory Visit: Payer: Self-pay

## 2018-10-17 ENCOUNTER — Ambulatory Visit (INDEPENDENT_AMBULATORY_CARE_PROVIDER_SITE_OTHER): Payer: Medicare Other | Admitting: Neurology

## 2018-10-17 ENCOUNTER — Encounter: Payer: Self-pay | Admitting: Neurology

## 2018-10-17 DIAGNOSIS — R51 Headache: Secondary | ICD-10-CM

## 2018-10-17 DIAGNOSIS — R519 Headache, unspecified: Secondary | ICD-10-CM

## 2018-10-17 MED ORDER — TOPIRAMATE 50 MG PO TABS: 50.0000 mg | ORAL_TABLET | Freq: Every day | ORAL | 3 refills | Status: DC

## 2018-10-17 NOTE — Progress Notes (Signed)
     Virtual Visit via Video Note  I connected with Leonard Williams on 10/17/18 at 10:00 AM EDT by a video enabled telemedicine application and verified that I am speaking with the correct person using two identifiers.   I discussed the limitations of evaluation and management by telemedicine and the availability of in person appointments. The patient expressed understanding and agreed to proceed.  History of Present Illness: Leonard Williams is a 73 year old right-handed white male with a history of migraine headaches, he currently is on Topamax taking 50 mg daily.  The patient has tolerated the medication well, he has occasional headaches, averaging once a month.  The headaches are oftentimes very mild in nature, he almost has to think about it to determine whether he has a headache or not.  The headaches are not disabling, and are not associated with visual changes or nausea or vomiting.  The headaches do not prevent him from performing activities of daily living.  The patient has been able to lose about 50 pounds, he is exercising regularly, he has altered his diet and dropped his hemoglobin A1c to 5.6.  Overall, he is staying active, he likes to hunt quail regularly.  He does not take any medications for his headache when they do occur.   Observations/Objective: WebEx evaluation revealed that the patient is alert and cooperative, speech is well enunciated, not aphasic or dysarthric.  The patient is responding to questions in an appropriate fashion.  Good facial symmetry was seen.  Assessment and Plan: 1.  Migraine headache, well controlled  The patient is maintained on a low-dose of Topamax taking 50 mg at night.  I have indicated that if he wishes to consolidate doctors and have his primary doctor write the prescription for Topamax, this would seem reasonable at this point as he otherwise does not require any medical management through this office.  I will send in a prescription for his  Topamax.  He will tentatively follow-up in 1 year, if his primary doctor is willing to write a prescription for Topamax in the future, he may cancel his appointment.  Follow Up Instructions: 1 month follow-up, may see nurse practitioner.   I discussed the assessment and treatment plan with the patient. The patient was provided an opportunity to ask questions and all were answered. The patient agreed with the plan and demonstrated an understanding of the instructions.   The patient was advised to call back or seek an in-person evaluation if the symptoms worsen or if the condition fails to improve as anticipated.  I provided 15 minutes of non-face-to-face time during this encounter.   Kathrynn Ducking, MD

## 2019-01-27 ENCOUNTER — Other Ambulatory Visit (HOSPITAL_COMMUNITY): Payer: Self-pay | Admitting: Nurse Practitioner

## 2019-02-06 DIAGNOSIS — H2513 Age-related nuclear cataract, bilateral: Secondary | ICD-10-CM | POA: Diagnosis not present

## 2019-02-06 DIAGNOSIS — H401123 Primary open-angle glaucoma, left eye, severe stage: Secondary | ICD-10-CM | POA: Diagnosis not present

## 2019-02-06 DIAGNOSIS — H401111 Primary open-angle glaucoma, right eye, mild stage: Secondary | ICD-10-CM | POA: Diagnosis not present

## 2019-02-21 ENCOUNTER — Other Ambulatory Visit: Payer: Self-pay

## 2019-02-21 ENCOUNTER — Ambulatory Visit (HOSPITAL_COMMUNITY)
Admission: RE | Admit: 2019-02-21 | Discharge: 2019-02-21 | Disposition: A | Discharge status: Home / Self Care | Payer: Medicare Other | Source: Ambulatory Visit | Attending: Nurse Practitioner | Admitting: Nurse Practitioner

## 2019-02-21 ENCOUNTER — Encounter (HOSPITAL_COMMUNITY): Payer: Self-pay | Admitting: Physician Assistant

## 2019-02-21 VITALS — BP 116/72 | HR 56 | Ht 74.0 in | Wt 199.0 lb

## 2019-02-21 DIAGNOSIS — Z79899 Other long term (current) drug therapy: Secondary | ICD-10-CM | POA: Insufficient documentation

## 2019-02-21 DIAGNOSIS — Z8249 Family history of ischemic heart disease and other diseases of the circulatory system: Secondary | ICD-10-CM | POA: Diagnosis not present

## 2019-02-21 DIAGNOSIS — I4819 Other persistent atrial fibrillation: Secondary | ICD-10-CM | POA: Insufficient documentation

## 2019-02-21 DIAGNOSIS — M1990 Unspecified osteoarthritis, unspecified site: Secondary | ICD-10-CM | POA: Diagnosis not present

## 2019-02-21 DIAGNOSIS — Z7901 Long term (current) use of anticoagulants: Secondary | ICD-10-CM | POA: Diagnosis not present

## 2019-02-21 LAB — MAGNESIUM: Magnesium: 2 mg/dL (ref 1.7–2.4)

## 2019-02-21 LAB — BASIC METABOLIC PANEL
Anion gap: 9 (ref 5–15)
BUN: 16 mg/dL (ref 8–23)
CO2: 24 mmol/L (ref 22–32)
Calcium: 9 mg/dL (ref 8.9–10.3)
Chloride: 108 mmol/L (ref 98–111)
Creatinine, Ser: 1 mg/dL (ref 0.61–1.24)
GFR calc Af Amer: >60 mL/min (ref >60)
GFR calc non Af Amer: >60 mL/min (ref >60)
Glucose, Bld: 109 mg/dL — ABNORMAL HIGH (ref 70–99)
Potassium: 4.2 mmol/L (ref 3.5–5.1)
Sodium: 141 mmol/L (ref 135–145)

## 2019-02-21 NOTE — Progress Notes (Signed)
Primary Care Physician: [name], MD Referring Physician: Dr. 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 Duration of afib unknown but thought to be persistent for months, possibly up to one year as he had not seen a MD since last yearly physical. He was seen by Cardiology who set pt up for cardioversion which was done after loading of Doac x 3 weeks with a chadsvasc score of 2(age, Recently dx pre didabetic). He did shock out but returned to afib after a few days. Pt recently lost 30 lbs over the last few months with dx of pre diabetes. Marland KitchenHe is pending a sleep study. No significant caffeine or alcohol use, no tobacco use. He is in the afib clinic to discuss options to return to [place].  He had a ETT 03/2016, which was low risk, echo showed normal heart function. Baseline EKG in afib shows LAFB as well as an EKG from 2017 in SR .He runs low 60's in SR without any AV blocking agents.  F/u in afib clinic, 10/15. I saw 10/10 and discussed options to restore SR, and pt wanted to thinka about options. He is in clinic today to come into hosptial for [place] admission.  F/u in afib clinic, 11/14, he initially was seen 10/25 after Tikosyn admit and was in [place]. He asked to be seen in the afib clinic today for irregularity in his heart rate. Ekg shows afib with 120 bpm. Later converted  on his own.  Follow up in AF clinic 02/21/19. Patient reports that he is doing very well with no heart racing or palpitations. Tolerating the medication well. He continues to do extremely well with his lifestyle modification.   Today, he denies symptoms of palpitations, chest pain, shortness of breath, orthopnea, PND, lower extremity edema, dizziness, presyncope, syncope, or neurologic sequela. The patient is tolerating medications without difficulties and is otherwise without  complaint today.   Past Medical History:  Diagnosis Date  . Arthritis    mild arthritis- neck shoulders, more right shoulder-no problems at present.  . Atrial fibrillation, persistent   . Cancer [facility])    GI tumor- stomach tumor "nonmalignant" last check 1 yr ago.  . Glaucoma   . Headache disorder 03/08/2015   Topamax taken daily-very low grade headache to none now.  . History of colonic diverticulitis    left  . History of hiatal hernia    omeprazole as needed- "mild"  . [name] pupil [facility])    Past Surgical History:  Procedure Laterality Date  . CARDIOVERSION N/A 04/10/2017   Procedure: CARDIOVERSION;  Surgeon: Lelon Perla, MD;  Location: [facility] ENDOSCOPY;  Service: Cardiovascular;  Laterality: N/A;  . COLONOSCOPY N/A 01/21/2015   Procedure: COLONOSCOPY;  Surgeon: Garlan Fair, MD;  Location: [facility] ENDOSCOPY;  Service: Endoscopy;  Laterality: N/A;-polyp removed in past-benign.  . ESOPHAGOGASTRODUODENOSCOPY (EGD) WITH PROPOFOL N/A 01/21/2015   Procedure: ESOPHAGOGASTRODUODENOSCOPY (EGD) WITH PROPOFOL;  Surgeon: Garlan Fair, MD;  Location: [facility] ENDOSCOPY;  Service: Endoscopy;  Laterality: N/A;  . EUS N/A 02/04/2015   Procedure: UPPER ENDOSCOPIC ULTRASOUND (EUS) LINEAR;  Surgeon: Milus Banister, MD;  Location: [facility] ENDOSCOPY;  Service: Endoscopy;  Laterality: N/A;  . EUS N/A 02/03/2016   Procedure: UPPER ENDOSCOPIC ULTRASOUND (EUS) RADIAL;  Surgeon: Milus Banister, MD;  Location: [facility] ENDOSCOPY;  Service: Endoscopy;  Laterality: N/A;  . EUS N/A 02/07/2018   Procedure: UPPER ENDOSCOPIC ULTRASOUND (EUS) RADIAL;  Surgeon: Milus Banister, MD;  Location: [facility] ENDOSCOPY;  Service: Endoscopy;  Laterality: N/A;  . EYE SURGERY     laser surgery for glaucoma  . HERNIA REPAIR Left    groin  . TONSILLECTOMY      Current Outpatient Medications  Medication Sig Dispense Refill  . cholecalciferol (VITAMIN D) 1000 units tablet Take 1,000 Units by mouth daily.     Marland Kitchen diltiazem (CARDIZEM) 30 MG  tablet Take 1 tablet every 4 hours AS NEEDED for AFIB heart rate over 100 (Patient not taking: Reported on 10/16/2018) 45 tablet 1  . dofetilide (TIKOSYN) 500 MCG capsule Take 1 capsule by mouth twice daily 60 capsule 0  . dorzolamide-timolol (COSOPT) 22.3-6.8 MG/ML ophthalmic solution Place 1 drop into both eyes 2 (two) times daily.     Marland Kitchen latanoprost (XALATAN) 0.005 % ophthalmic solution Place 1 drop into both eyes at bedtime.     . Multiple Vitamin (MULTIVITAMIN WITH MINERALS) TABS tablet Take 1 tablet by mouth daily.    . RHOPRESSA 0.02 % SOLN Place 1 drop into both eyes daily.   0  . sildenafil (REVATIO) 20 MG tablet Take 100 mg by mouth daily as needed.  8  . tacrolimus (PROTOPIC) 0.1 % ointment Apply 1 application topically daily as needed (under eye irritation).   0  . topiramate (TOPAMAX) 50 MG tablet Take 1 tablet (50 mg total) by mouth daily. 90 tablet 3  . XARELTO 20 MG TABS tablet TAKE 1 TABLET BY MOUTH ONCE DAILY WITH SUPPER 30 tablet 5   No current facility-administered medications for this encounter.     No Known Allergies  Social History   Socioeconomic History  . Marital status: Married    Spouse name: Not on file  . Number of children: 3  . Years of education: BA  . Highest education level: Not on file  Occupational History  . Occupation: retired  Scientific laboratory technician  . Financial resource strain: Not on file  . Food insecurity    Worry: Not on file    Inability: Not on file  . Transportation needs    Medical: Not on file    Non-medical: Not on file  Tobacco Use  . Smoking status: Never Smoker  . Smokeless tobacco: Never Used  Substance and Sexual Activity  . Alcohol use: Yes    Alcohol/week: 2.0 - 4.0 standard drinks    Types: 1 - 2 Cans of beer, 1 - 2 Standard drinks or equivalent per week    Comment: social - 1-2 beer per  week  . Drug use: No  . Sexual activity: Yes    Birth control/protection: None  Lifestyle  . Physical activity    Days per week: Not on  file    Minutes per session: Not on file  . Stress: Not on file  Relationships  . Social Herbalist on phone: Not on file    Gets together: Not on file    Attends religious service: Not on file    Active member of club or organization: Not on file    Attends meetings of clubs or organizations: Not on file    Relationship status: Not on file  . Intimate partner violence    Fear of current or ex partner: Not on file    Emotionally abused: Not on file    Physically abused: Not  on file    Forced sexual activity: Not on file  Other Topics Concern  . Not on file  Social History Narrative   Patient drinks 2 cups of caffeine daily.   Patient is right handed.    Family History  Problem Relation Age of Onset  . Glaucoma Mother   . Dementia Father   . Graves' disease Sister   . Heart disease Sister   . Migraines Sister   . Heart attack Brother   . CAD Brother     ROS- All systems are reviewed and negative except as per the HPI above  Physical Exam: There were no vitals filed for this visit. Wt Readings from Last 3 Encounters:  08/23/18 92.1 kg  04/22/18 91.6 kg  02/07/18 90.7 kg    Labs: Lab Results  Component Value Date   NA 139 09/13/2018   K 4.5 09/13/2018   CL 107 09/13/2018   CO2 26 09/13/2018   GLUCOSE 110 (H) 09/13/2018   BUN 18 09/13/2018   CREATININE 1.21 09/13/2018   CALCIUM 9.0 09/13/2018   MG 2.2 09/13/2018   No results found for: INR No results found for: CHOL, HDL, LDLCALC, TRIG   GEN- The patient is well appearing, alert and oriented x 3 today.   HEENT-head normocephalic, atraumatic, sclera clear, conjunctiva pink, hearing intact, trachea midline. Lungs- Clear to ausculation bilaterally, normal work of breathing Heart- Regular rate and rhythm, no murmurs, rubs or gallops  GI- soft, NT, ND, + BS Extremities- no clubbing, cyanosis, or edema MS- no significant deformity or atrophy Skin- no rash or lesion Psych- euthymic mood, full affect  Neuro- strength and sensation are intact   EKG- SR HR 56, LAFB, inc RBBB, PR 186, QRS 104, QTc 416   Echo-Study Conclusions  - Left ventricle: The cavity size was normal. Wall thickness was   normal. Systolic function was normal. The estimated ejection   fraction was in the range of 50% to 55%. Wall motion was normal;   there were no regional wall motion abnormalities. Left   ventricular diastolic function parameters were normal. - Left atrium: The atrium was mildly dilated. Volume/bsa, ES,   (1-plane Simpson&'s, A2C): 43.4 ml/m^2. - Right atrium: The atrium was mildly dilated.  Impressions:  - Compared to the prior study, there has been no significant   interval change.  Assessment and Plan: 1. Persistent  Afib Patient appears to be maintaining SR. Continue  Dofetilide at 500 mcg bid 30 mg cardizem if needed, but no episodes reported Continue xarelto 20 mg daily Bmet/mag today  This patients CHA2DS2-VASc Score and unadjusted Ischemic Stroke Rate (% per year) is equal to 2.2 % stroke rate/year from a score of 2  Above score calculated as 1 point each if present [CHF, HTN, DM, Vascular=MI/PAD/Aortic Plaque, Age if 65-74, or Male] Above score calculated as 2 points each if present [Age > 75, or Stroke/TIA/TE]   Follow up in the AF clinic in 6 months.   [facility] [address] [place], [place] [address] [phone]

## 2019-02-23 ENCOUNTER — Other Ambulatory Visit (HOSPITAL_COMMUNITY): Payer: Self-pay | Admitting: Nurse Practitioner

## 2019-02-24 DIAGNOSIS — N183 Chronic kidney disease, stage 3 (moderate): Secondary | ICD-10-CM | POA: Diagnosis not present

## 2019-02-24 DIAGNOSIS — I4891 Unspecified atrial fibrillation: Secondary | ICD-10-CM | POA: Diagnosis not present

## 2019-02-24 DIAGNOSIS — H409 Unspecified glaucoma: Secondary | ICD-10-CM | POA: Diagnosis not present

## 2019-03-23 ENCOUNTER — Other Ambulatory Visit: Payer: Self-pay | Admitting: Cardiology

## 2019-03-24 DIAGNOSIS — H401123 Primary open-angle glaucoma, left eye, severe stage: Secondary | ICD-10-CM | POA: Diagnosis not present

## 2019-03-24 DIAGNOSIS — H401111 Primary open-angle glaucoma, right eye, mild stage: Secondary | ICD-10-CM | POA: Diagnosis not present

## 2019-03-24 DIAGNOSIS — H2513 Age-related nuclear cataract, bilateral: Secondary | ICD-10-CM | POA: Diagnosis not present

## 2019-03-24 NOTE — Telephone Encounter (Signed)
52m 90.3kg Scr 1.00 02/21/19 Lovw/fenton 02/21/19 ccr 85.3

## 2019-03-28 DIAGNOSIS — Z23 Encounter for immunization: Secondary | ICD-10-CM | POA: Diagnosis not present

## 2019-04-04 ENCOUNTER — Telehealth: Payer: Self-pay | Admitting: *Deleted

## 2019-04-04 NOTE — Telephone Encounter (Signed)
Clinical pharmacist to review 

## 2019-04-04 NOTE — Telephone Encounter (Signed)
   [facility] Pre-operative Risk Assessment    Request for surgical clearance:  1. What type of surgery is being performed? CE OS W/GONIOTOMY    2. When is this surgery scheduled? 04/10/19   3. What type of clearance is required (medical clearance vs. Pharmacy clearance to hold med vs. Both)? BOTH  4. Are there any medications that need to be held prior to surgery and how long? [place]    5. Practice name and name of physician performing surgery? [facility] ; DR. GROAT/COHEN   6. What is your office phone number [phone]    7.   What is your office fax number [phone]  8.   Anesthesia type (None, local, MAC, general) ? MAC   Julaine Hua 04/04/2019, 2:05 PM  _________________________________________________________________   (provider comments below)

## 2019-04-04 NOTE — Telephone Encounter (Signed)
   Primary Cardiologist: [facility] / Dr. Marlou Porch  Chart reviewed as part of pre-operative protocol coverage. Patient was contacted 04/04/2019 in reference to pre-operative risk assessment for pending surgery as outlined below.  Leonard Williams was last seen on 02/21/2019 by Malka So PA-C.  Since that day, Leonard Williams has done well without recurrent palpitation, chest pain or dyspnea.  Therefore, based on ACC/AHA guidelines, the patient would be at acceptable risk for the planned procedure without further cardiovascular testing.   I will route this recommendation to the requesting party via Epic fax function and remove from pre-op pool.  Please call with questions. Per our clinical pharmacist, may hold Xarelto for 2 days prior to the procedures and restart as soon as possible afterward at the surgeon's discretion based on bleeding risk.  [place], [place] 04/04/2019, 6:06 PM

## 2019-04-04 NOTE — Telephone Encounter (Signed)
Patient with diagnosis of afib on Xarelto for anticoagulation.    Procedure: CE OS W/GONIOTOMY   Date of procedure: 04/10/2019  CHADS2-VASc score of  1 (AGE)  CrCl 95 ml/min  Per office protocol, patient can hold Xarelto for 2 days prior to procedure.

## 2019-04-04 NOTE — Telephone Encounter (Signed)
This cardiac clearance is for 2 surgeries. Patient will have left eye surgery on 10/1, then the same right eye surgery on 10/15.

## 2019-04-07 DIAGNOSIS — H2513 Age-related nuclear cataract, bilateral: Secondary | ICD-10-CM | POA: Diagnosis not present

## 2019-04-09 DIAGNOSIS — N183 Chronic kidney disease, stage 3 (moderate): Secondary | ICD-10-CM | POA: Diagnosis not present

## 2019-04-09 DIAGNOSIS — I4891 Unspecified atrial fibrillation: Secondary | ICD-10-CM | POA: Diagnosis not present

## 2019-04-09 DIAGNOSIS — H409 Unspecified glaucoma: Secondary | ICD-10-CM | POA: Diagnosis not present

## 2019-04-10 DIAGNOSIS — H2512 Age-related nuclear cataract, left eye: Secondary | ICD-10-CM | POA: Diagnosis not present

## 2019-04-10 DIAGNOSIS — H401123 Primary open-angle glaucoma, left eye, severe stage: Secondary | ICD-10-CM | POA: Diagnosis not present

## 2019-04-24 DIAGNOSIS — H2511 Age-related nuclear cataract, right eye: Secondary | ICD-10-CM | POA: Diagnosis not present

## 2019-04-24 DIAGNOSIS — H401111 Primary open-angle glaucoma, right eye, mild stage: Secondary | ICD-10-CM | POA: Diagnosis not present

## 2019-04-29 ENCOUNTER — Telehealth: Payer: Self-pay | Admitting: Physician Assistant

## 2019-04-29 NOTE — Telephone Encounter (Signed)
Pt wife called because he had taken his Tikosyn 3 hr early.   She was concerned.   No palpitations, no chest pain or SOB.  Advised her ok to adjust the dose by 30" per dose, to get back to his normal schedule.   Call 911 immediately if any sx.   Pt wife agrees w/ plan.   Rosaria Ferries, PA-C 04/29/2019 6:56 PM Bee[place] [phone]

## 2019-05-15 DIAGNOSIS — Z23 Encounter for immunization: Secondary | ICD-10-CM | POA: Diagnosis not present

## 2019-05-15 DIAGNOSIS — I4891 Unspecified atrial fibrillation: Secondary | ICD-10-CM | POA: Diagnosis not present

## 2019-05-15 DIAGNOSIS — E559 Vitamin D deficiency, unspecified: Secondary | ICD-10-CM | POA: Diagnosis not present

## 2019-05-15 DIAGNOSIS — Z0000 Encounter for general adult medical examination without abnormal findings: Secondary | ICD-10-CM | POA: Diagnosis not present

## 2019-05-15 DIAGNOSIS — N529 Male erectile dysfunction, unspecified: Secondary | ICD-10-CM | POA: Diagnosis not present

## 2019-05-15 DIAGNOSIS — R7309 Other abnormal glucose: Secondary | ICD-10-CM | POA: Diagnosis not present

## 2019-05-23 DIAGNOSIS — N1831 Chronic kidney disease, stage 3a: Secondary | ICD-10-CM | POA: Diagnosis not present

## 2019-05-23 DIAGNOSIS — I4891 Unspecified atrial fibrillation: Secondary | ICD-10-CM | POA: Diagnosis not present

## 2019-05-23 DIAGNOSIS — H409 Unspecified glaucoma: Secondary | ICD-10-CM | POA: Diagnosis not present

## 2019-05-26 ENCOUNTER — Telehealth: Payer: Self-pay | Admitting: Internal Medicine

## 2019-05-26 NOTE — Telephone Encounter (Signed)
New Message  Pt c/o medication issue:  1. Name of Medication: dofetilide (TIKOSYN) 500 MCG capsule  2. How are you currently taking this medication (dosage and times per day)? As directed  3. Are you having a reaction (difficulty breathing--STAT)? No  4. What is your medication issue? Patient takes his medication at 9 am each day. But has forgotten to this morning. Patient would like to know should he take it now or wait until next scheduled dose. Please give patient/patient's wife a call back to advise.

## 2019-05-26 NOTE — Telephone Encounter (Signed)
Pt will omit morning dose he missed and take his normal dose tonight at normal time per Roderic Palau NP. Wife verbalized understanding.

## 2019-06-27 DIAGNOSIS — Z20828 Contact with and (suspected) exposure to other viral communicable diseases: Secondary | ICD-10-CM | POA: Diagnosis not present

## 2019-07-17 DIAGNOSIS — H409 Unspecified glaucoma: Secondary | ICD-10-CM | POA: Diagnosis not present

## 2019-07-17 DIAGNOSIS — I4891 Unspecified atrial fibrillation: Secondary | ICD-10-CM | POA: Diagnosis not present

## 2019-07-30 ENCOUNTER — Ambulatory Visit: Payer: Medicare Other | Attending: Internal Medicine

## 2019-07-30 DIAGNOSIS — Z23 Encounter for immunization: Secondary | ICD-10-CM | POA: Insufficient documentation

## 2019-07-30 NOTE — Progress Notes (Signed)
   Covid-19 Vaccination Clinic  Name:  KEYUN TRENARY    MRN: [id number] DOB: [birth year]  07/30/2019  Mr. Phou was observed post Covid-19 immunization for 15 minutes without incidence. He was provided with Vaccine Information Sheet and instruction to access the V-Safe system.   Mr. Reagan was instructed to call 911 with any severe reactions post vaccine: Marland Kitchen Difficulty breathing  . Swelling of your face and throat  . A fast heartbeat  . A bad rash all over your body  . Dizziness and weakness    Immunizations Administered    Name Date Dose VIS Date Route   Pfizer COVID-19 Vaccine 07/30/2019  2:35 PM 0.3 mL 06/20/2019 Intramuscular   Manufacturer: [place]   Lot: [id number]   [place]: [id number]

## 2019-08-14 DIAGNOSIS — H409 Unspecified glaucoma: Secondary | ICD-10-CM | POA: Diagnosis not present

## 2019-08-14 DIAGNOSIS — I4891 Unspecified atrial fibrillation: Secondary | ICD-10-CM | POA: Diagnosis not present

## 2019-08-18 ENCOUNTER — Ambulatory Visit: Payer: Medicare Other | Attending: Internal Medicine

## 2019-08-18 DIAGNOSIS — Z23 Encounter for immunization: Secondary | ICD-10-CM | POA: Insufficient documentation

## 2019-08-18 NOTE — Progress Notes (Signed)
   Covid-19 Vaccination Clinic  Name:  Leonard Williams    MRN: [id number] DOB: 09/12/1945  08/18/2019  Mr. [name] was observed post Covid-19 immunization for 15 minutes without incidence. He was provided with Vaccine Information Sheet and instruction to access the V-Safe system.   Mr. [name] was instructed to call 911 with any severe reactions post vaccine: Marland Kitchen Difficulty breathing  . Swelling of your face and throat  . A fast heartbeat  . A bad rash all over your body  . Dizziness and weakness    Immunizations Administered    Name Date Dose VIS Date Route   Pfizer COVID-19 Vaccine 08/18/2019  9:53 AM 0.3 mL 06/20/2019 Intramuscular   Manufacturer: [place]   Lot: [id number]   [place]: [id number]

## 2019-08-21 ENCOUNTER — Encounter (HOSPITAL_COMMUNITY): Payer: Self-pay | Admitting: Nurse Practitioner

## 2019-08-21 ENCOUNTER — Other Ambulatory Visit: Payer: Self-pay

## 2019-08-21 ENCOUNTER — Ambulatory Visit (HOSPITAL_COMMUNITY)
Admission: RE | Admit: 2019-08-21 | Discharge: 2019-08-21 | Disposition: A | Discharge status: Home / Self Care | Payer: Medicare Other | Source: Ambulatory Visit | Attending: Nurse Practitioner | Admitting: Nurse Practitioner

## 2019-08-21 VITALS — BP 126/68 | HR 52 | Ht 74.0 in | Wt 203.6 lb

## 2019-08-21 DIAGNOSIS — R7303 Prediabetes: Secondary | ICD-10-CM | POA: Diagnosis not present

## 2019-08-21 DIAGNOSIS — I4819 Other persistent atrial fibrillation: Secondary | ICD-10-CM | POA: Insufficient documentation

## 2019-08-21 DIAGNOSIS — M1990 Unspecified osteoarthritis, unspecified site: Secondary | ICD-10-CM | POA: Diagnosis not present

## 2019-08-21 DIAGNOSIS — Z7901 Long term (current) use of anticoagulants: Secondary | ICD-10-CM | POA: Diagnosis not present

## 2019-08-21 DIAGNOSIS — Z79899 Other long term (current) drug therapy: Secondary | ICD-10-CM | POA: Insufficient documentation

## 2019-08-21 DIAGNOSIS — D6869 Other thrombophilia: Secondary | ICD-10-CM | POA: Diagnosis not present

## 2019-08-21 LAB — BASIC METABOLIC PANEL
Anion gap: 9 (ref 5–15)
BUN: 13 mg/dL (ref 8–23)
CO2: 27 mmol/L (ref 22–32)
Calcium: 8.8 mg/dL — ABNORMAL LOW (ref 8.9–10.3)
Chloride: 104 mmol/L (ref 98–111)
Creatinine, Ser: 0.99 mg/dL (ref 0.61–1.24)
GFR calc Af Amer: >60 mL/min (ref >60)
GFR calc non Af Amer: >60 mL/min (ref >60)
Glucose, Bld: 107 mg/dL — ABNORMAL HIGH (ref 70–99)
Potassium: 4.7 mmol/L (ref 3.5–5.1)
Sodium: 140 mmol/L (ref 135–145)

## 2019-08-21 LAB — MAGNESIUM: Magnesium: 2 mg/dL (ref 1.7–2.4)

## 2019-08-21 IMAGING — US US RENAL
1 series · 14 of 25 positions shown · non-contrast
Comparison: 08/28/2012

CLINICAL DATA: Chronic renal disease

EXAM:
RENAL / URINARY TRACT ULTRASOUND COMPLETE

[Series 1: us renal · 0.25mm/px · 14 of 32 slices shown]
[im 1/32]
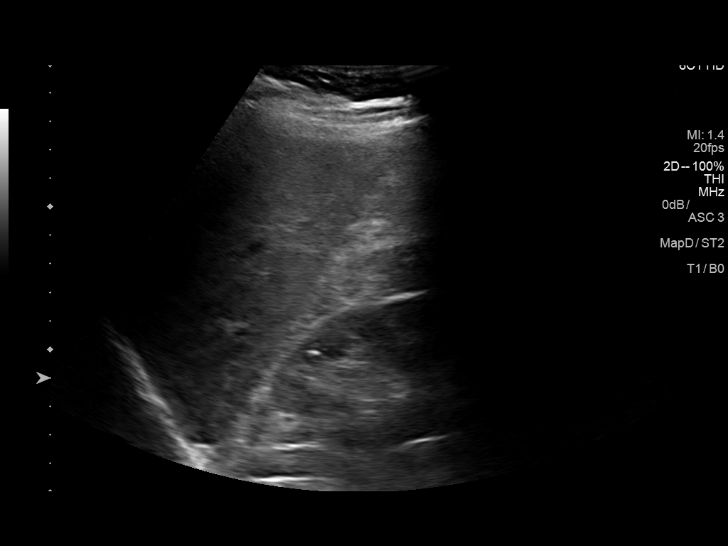
[im 3/32]
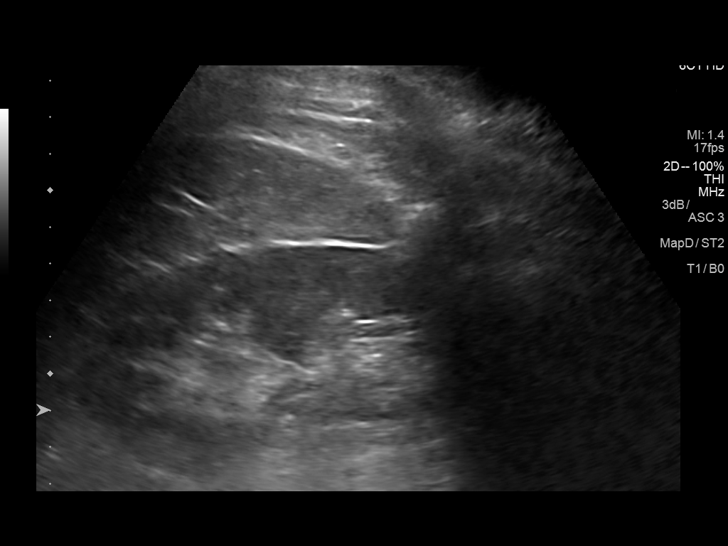
[im 6/32]
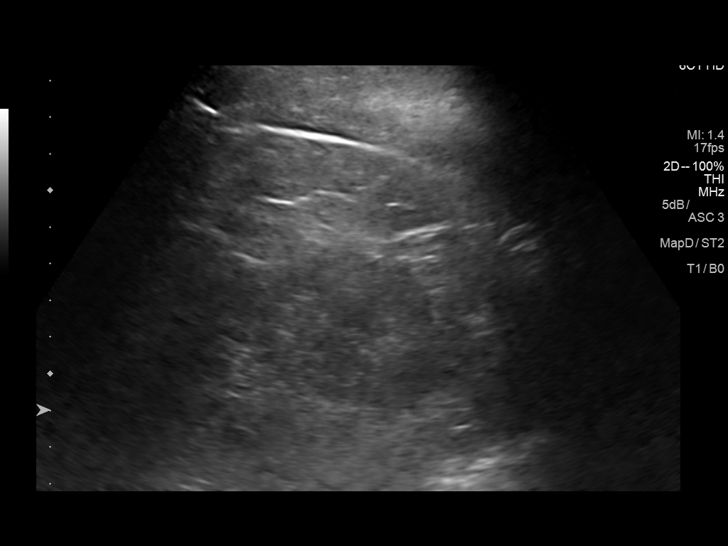
[im 8/32]
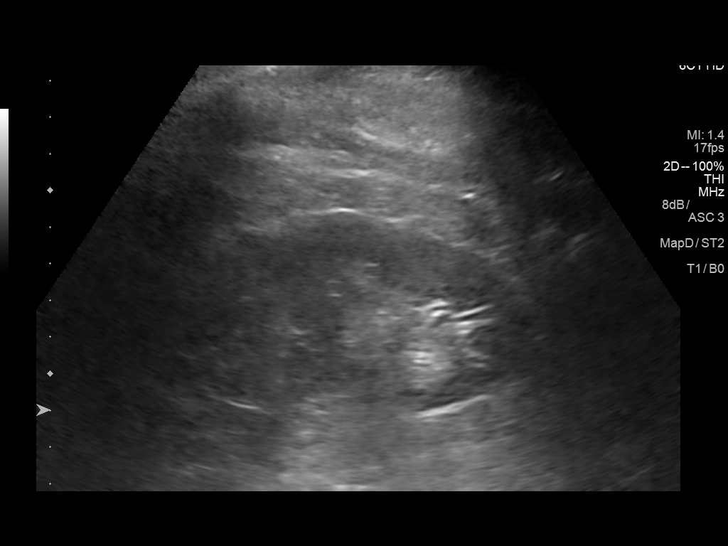
[im 11/32]
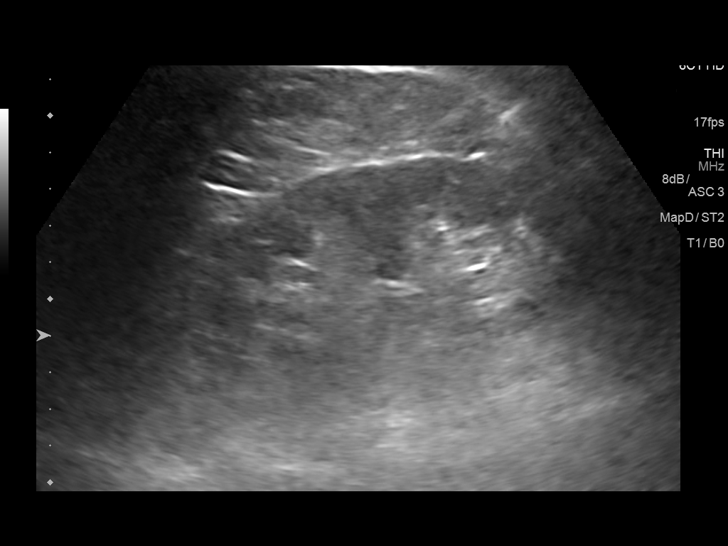
[im 12/32]
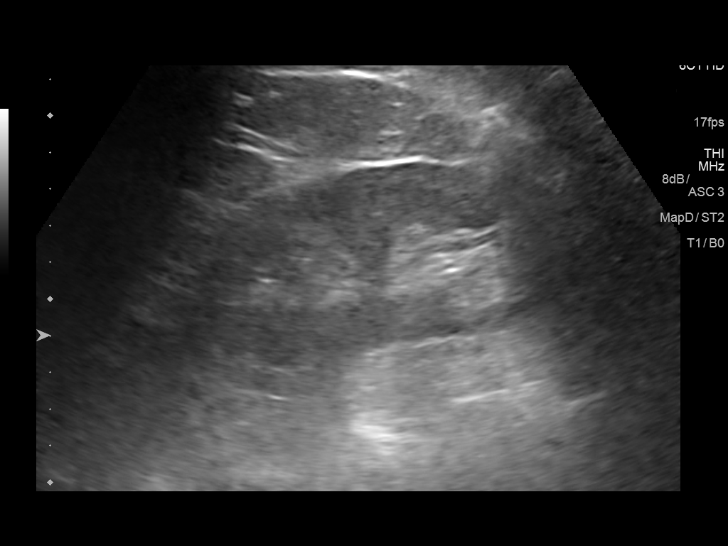
[im 15/32]
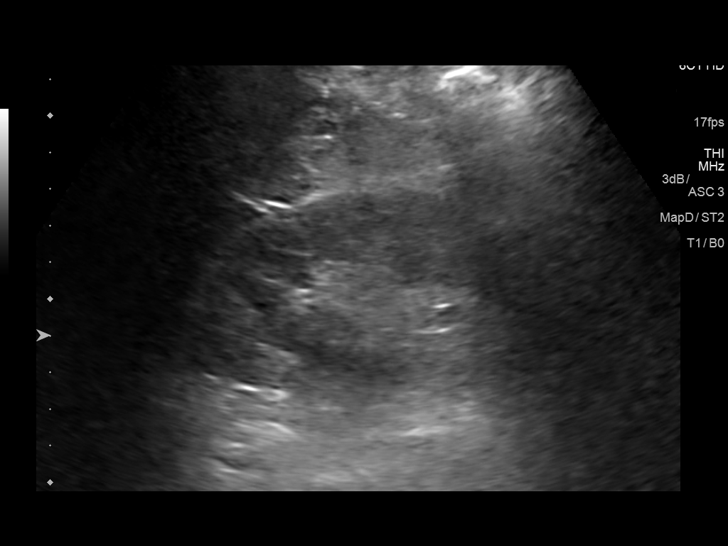
[im 17/32]
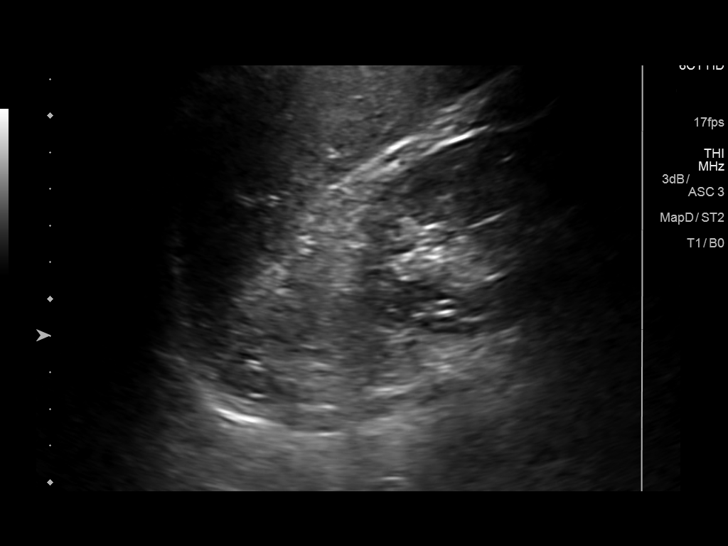
[im 20/32]
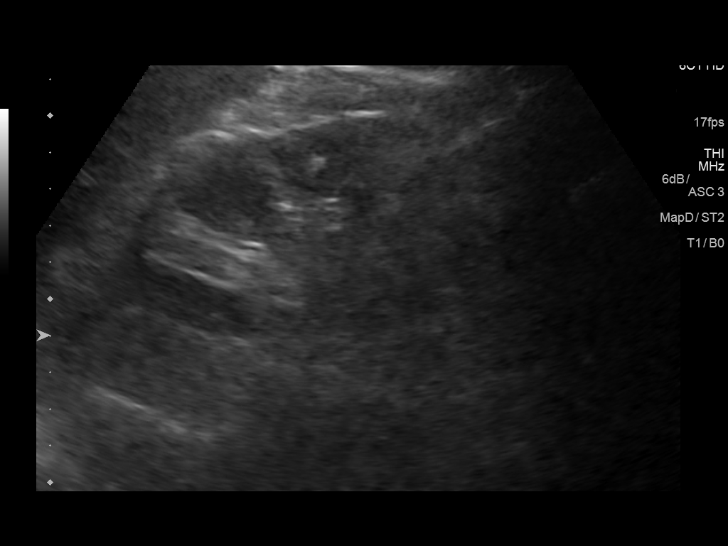
[im 21/32]
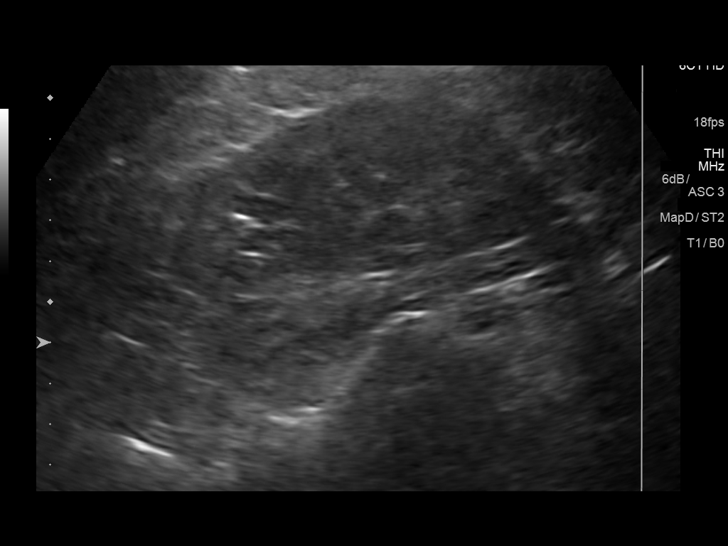
[im 24/32]
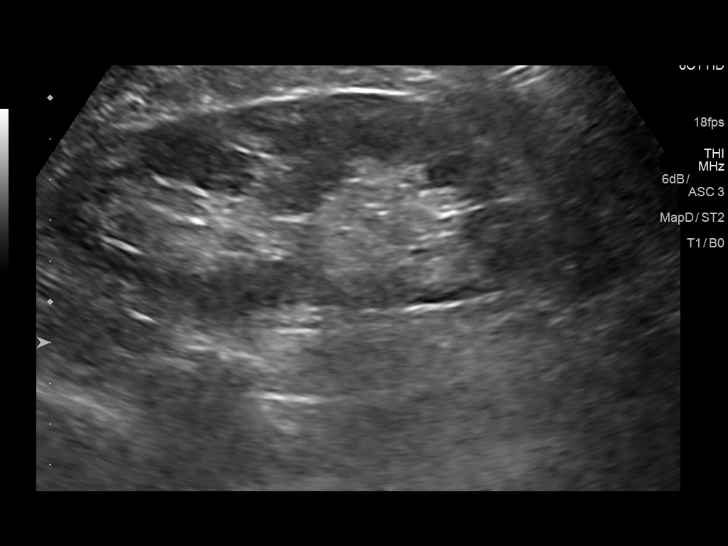
[im 26/32]
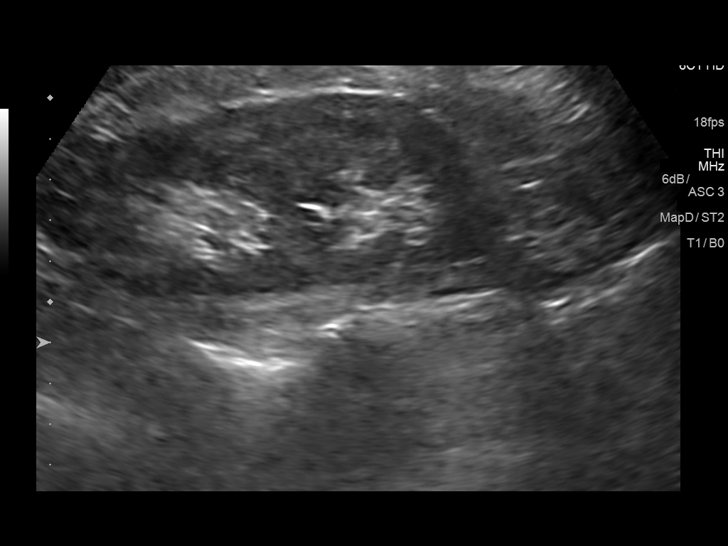
[im 29/32]
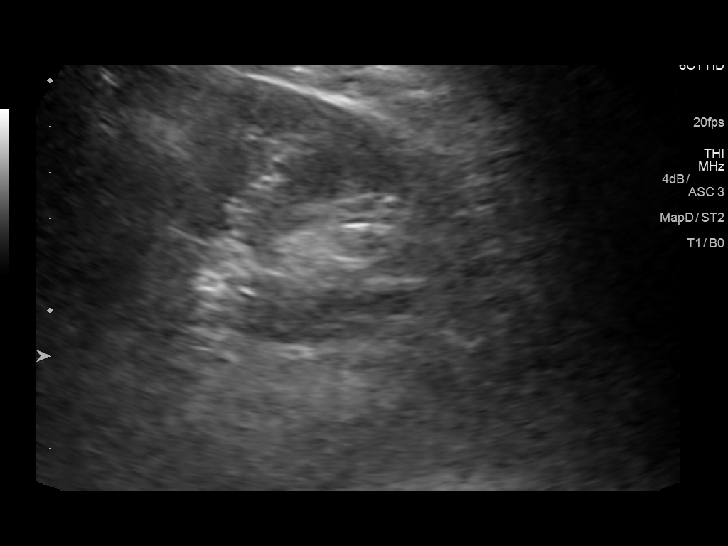
[im 32/32]
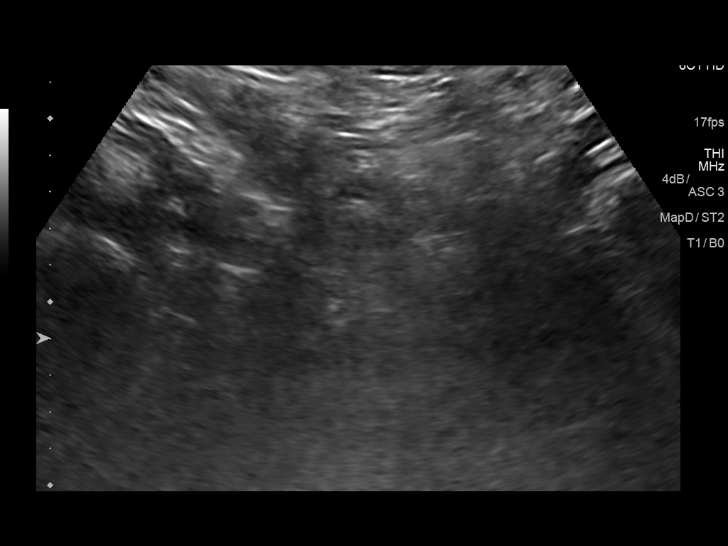

[14 of 25 positions shown; findings below may reference images not displayed]

FINDINGS: Right Kidney:

Length: 10.8 cm.. Echogenicity within normal limits. No mass or
hydronephrosis visualized.

Left Kidney:

Length: 12.0 cm.. Echogenicity within normal limits. No mass or
hydronephrosis visualized.

Bladder:

Decompressed
IMPRESSION: No acute abnormality noted.

## 2019-08-21 NOTE — Progress Notes (Signed)
Primary Care Physician: Leonard Huddle, MD Referring Physician: Dr. 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 Duration of afib unknown but thought to be persistent for months, possibly up to one year as he had not seen a MD since last yearly physical. He was seen by Cardiology who set pt up for cardioversion which was done after loading of Doac x 3 weeks with a chadsvasc score of 2(age, Recently dx pre didabetic). He did shock out but returned to afib after a few days. Pt recently lost 30 lbs over the last few months with dx of pre diabetes. Marland KitchenHe is pending a sleep study. No significant caffeine or alcohol use, no tobacco use. He is in the afib clinic to discuss options to return to [facility].  He had a ETT 03/2016, which was low risk, echo showed normal heart function. Baseline EKG in afib shows LAFB as well as an EKG from 2017 in SR .He runs low 60's in SR without any AV blocking agents.  F/u in afib clinic, 10/15. I saw 10/10 and discussed options to restore SR, and pt wanted to thinka about options. He is in clinic today to come into hosptial for [place] admission.  F/u in afib clinic, 11/14, he initially was seen 10/25 after Tikosyn admit and was in [place]. He asked to be seen in the afib clinic today for irregularity in his heart rate. Ekg shows afib with 120 bpm. Later converted  on his own.  F/u in afib clinic for one month f/u after tikosyn. He is in SR today. BP stable.  F/u in afib clinic, 4/19. He remains in SR and feels well. Continue on dofetilide and xarelto, no issues.  F/u in afib clinic for surveillance of Tikosyn. He remains in SR, no afib noted and  feels well. He will be having an endoscopy in August and has been ok'ed per Leonard Williams, PharmD that he can stop xarelto 1-2 days before procedure.  F/u 10/14. He feels well. He has not noted  any afib on tikosyn. No issues with eliquis.  F/u in afib clinic 2/14. He continues on Tikosyn and is doing well, no afib reported. No issues with xarelto. qtc stable.  F/u in afib clinic 08/22/19 for Tikosyn surveillance. He does not report any afib. He and his wife both had mild covid cases after Christmas and since have received both of the vaccines. No issues with bleeding, continues on xarelto with CHA2DS2VASc score of 2.   Today, he denies symptoms of palpitations, chest pain, shortness of breath, orthopnea, PND, lower extremity edema, dizziness, presyncope, syncope, or neurologic sequela. The patient is tolerating medications without difficulties and is otherwise without complaint today.   Past Medical History:  Diagnosis Date  . Arthritis    mild arthritis- neck shoulders, more right shoulder-no problems at present.  . Atrial fibrillation, persistent   . Cancer [facility])    GI tumor- stomach tumor "nonmalignant" last check 1 yr ago.  . Glaucoma   . Headache disorder 03/08/2015   Topamax taken daily-very low grade headache to none now.  . History of colonic diverticulitis    left  . History of hiatal hernia    omeprazole as needed- "mild"  . 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  CARDIOVERSION N/A 04/10/2017   Procedure: CARDIOVERSION;  Surgeon: Leonard Perla, MD;  Location: [facility] ENDOSCOPY;  Service: Cardiovascular;  Laterality: N/A;  . COLONOSCOPY N/A 01/21/2015   Procedure: COLONOSCOPY;  Surgeon: Garlan Fair, MD;  Location: [facility] ENDOSCOPY;  Service: Endoscopy;  Laterality: N/A;-polyp removed in past-benign.  . ESOPHAGOGASTRODUODENOSCOPY (EGD) WITH PROPOFOL N/A 01/21/2015   Procedure: ESOPHAGOGASTRODUODENOSCOPY (EGD) WITH PROPOFOL;  Surgeon: Garlan Fair, MD;  Location: [facility] ENDOSCOPY;  Service: Endoscopy;  Laterality: N/A;  . EUS N/A 02/04/2015   Procedure: UPPER ENDOSCOPIC ULTRASOUND (EUS) LINEAR;  Surgeon: Milus Banister, MD;   Location: [facility] ENDOSCOPY;  Service: Endoscopy;  Laterality: N/A;  . EUS N/A 02/03/2016   Procedure: UPPER ENDOSCOPIC ULTRASOUND (EUS) RADIAL;  Surgeon: Milus Banister, MD;  Location: [facility] ENDOSCOPY;  Service: Endoscopy;  Laterality: N/A;  . EUS N/A 02/07/2018   Procedure: UPPER ENDOSCOPIC ULTRASOUND (EUS) RADIAL;  Surgeon: Milus Banister, MD;  Location: [facility] ENDOSCOPY;  Service: Endoscopy;  Laterality: N/A;  . EYE SURGERY     laser surgery for glaucoma  . HERNIA REPAIR Left    groin  . TONSILLECTOMY      Current Outpatient Medications  Medication Sig Dispense Refill  . cholecalciferol (VITAMIN D) 1000 units tablet Take 1,000 Units by mouth daily.     Marland Kitchen dofetilide (TIKOSYN) 500 MCG capsule Take 1 capsule by mouth twice daily 60 capsule 6  . dorzolamide-timolol (COSOPT) 22.3-6.8 MG/ML ophthalmic solution Place 1 drop into both eyes 2 (two) times daily.     Marland Kitchen latanoprost (XALATAN) 0.005 % ophthalmic solution Place 1 drop into both eyes at bedtime.     . Multiple Vitamin (MULTIVITAMIN WITH MINERALS) TABS tablet Take 1 tablet by mouth daily.    . sildenafil (REVATIO) 20 MG tablet Take 100 mg by mouth daily as needed.  8  . tacrolimus (PROTOPIC) 0.1 % ointment Apply 1 application topically as needed (under eye irritation).   0  . XARELTO 20 MG TABS tablet TAKE 1 TABLET BY MOUTH ONCE DAILY WITH SUPPER 90 tablet 2   No current facility-administered medications for this encounter.    No Known Allergies  Social History   Socioeconomic History  . Marital status: Married    Spouse name: Not on file  . Number of children: 3  . Years of education: BA  . Highest education level: Not on file  Occupational History  . Occupation: retired  Tobacco Use  . Smoking status: Never Smoker  . Smokeless tobacco: Never Used  Substance and Sexual Activity  . Alcohol use: Yes    Alcohol/week: 2.0 - 4.0 standard drinks    Types: 1 - 2 Cans of beer, 1 - 2 Standard drinks or equivalent per week    Comment:  social - 1-2 beer per  week  . Drug use: No  . Sexual activity: Yes    Birth control/protection: None  Other Topics Concern  . Not on file  Social History Narrative   Patient drinks 2 cups of caffeine daily.   Patient is right handed.   Social Determinants of Health   Financial Resource Strain:   . Difficulty of Paying Living Expenses: Not on file  Food Insecurity:   . Worried About Charity fundraiser in the Last Year: Not on file  . Ran Out of Food in the Last Year: Not on file  Transportation Needs:   . Lack of Transportation (Medical): Not on file  . Lack of Transportation (Non-Medical): Not on file  Physical Activity:   . Days of Exercise per Week: Not on file  . Minutes of Exercise per Session: Not on file  Stress:   . Feeling of Stress : Not on file  Social Connections:   . Frequency of Communication with Friends and Family: Not on file  . Frequency of Social Gatherings with Friends and Family: Not on file  . Attends Religious Services: Not on file  . Active Member of Clubs or Organizations: Not on file  . Attends Archivist Meetings: Not on file  . Marital Status: Not on file  Intimate Partner Violence:   . Fear of Current or Ex-Partner: Not on file  . Emotionally Abused: Not on file  . Physically Abused: Not on file  . Sexually Abused: Not on file    Family History  Problem Relation Age of Onset  . Glaucoma Mother   . Dementia Father   . Graves' disease Sister   . Heart disease Sister   . Migraines Sister   . Heart attack Brother   . CAD Brother     ROS- All systems are reviewed and negative except as per the HPI above  Physical Exam: Vitals:   08/21/19 1030  BP: 126/68  Pulse: (!) 52  Weight: 92.4 kg  Height: 6\' 2"  (1.88 m)   Wt Readings from Last 3 Encounters:  08/21/19 92.4 kg  02/21/19 90.3 kg  08/23/18 92.1 kg    Labs: Lab Results  Component Value Date   NA 140 08/21/2019   K 4.7 08/21/2019   CL 104 08/21/2019   CO2 27  08/21/2019   GLUCOSE 107 (H) 08/21/2019   BUN 13 08/21/2019   CREATININE 0.99 08/21/2019   CALCIUM 8.8 (L) 08/21/2019   MG 2.0 08/21/2019   No results found for: INR No results found for: CHOL, HDL, LDLCALC, TRIG   GEN- The patient is well appearing, alert and oriented x 3 today.   Head- normocephalic, atraumatic Eyes-  Sclera clear, conjunctiva pink Ears- hearing intact Oropharynx- clear Neck- Williams, no JVP Lymph- no cervical lymphadenopathy Lungs- Clear to ausculation bilaterally, normal work of breathing Heart-  regular rate and rhythm, no murmurs, rubs or gallops, PMI not laterally displaced GI- soft, NT, ND, + BS Extremities- no clubbing, cyanosis, or edema MS- no significant deformity or atrophy Skin- no rash or lesion Psych- euthymic mood, full affect Neuro- strength and sensation are intact  EKG- Sinus brady at 52 bpm, pr int 174 bpm, qrs int 96 ms, qtc 424 ms(stable)   Echo-Study Conclusions  - Left ventricle: The cavity size was normal. Wall thickness was   normal. Systolic function was normal. The estimated ejection   fraction was in the range of 50% to 55%. Wall motion was normal;   there were no regional wall motion abnormalities. Left   ventricular diastolic function parameters were normal. - Left atrium: The atrium was mildly dilated. Volume/bsa, ES,   (1-plane Simpson&'s, A2C): 43.4 ml/m^2. - Right atrium: The atrium was mildly dilated.  Impressions:  - Compared to the prior study, there has been no significant   interval change.   1.Assessment and Plan: Persistent  Afib Maintaining  SR Continue  Dofetilide at 500 mcg bid 30 mg cardizem if needed, but no episodes reported Continue xarelto with a chadsvasc score of 2  Bmet/mag today  F/u with afib clinic 6 months for Tikosyn surveillance  Leonard Williams [address] [place],  [address]  [phone]      

## 2019-09-02 DIAGNOSIS — H04203 Unspecified epiphora, bilateral lacrimal glands: Secondary | ICD-10-CM | POA: Diagnosis not present

## 2019-09-02 DIAGNOSIS — H401123 Primary open-angle glaucoma, left eye, severe stage: Secondary | ICD-10-CM | POA: Diagnosis not present

## 2019-09-02 DIAGNOSIS — H401111 Primary open-angle glaucoma, right eye, mild stage: Secondary | ICD-10-CM | POA: Diagnosis not present

## 2019-09-02 DIAGNOSIS — Z961 Presence of intraocular lens: Secondary | ICD-10-CM | POA: Diagnosis not present

## 2019-09-12 DIAGNOSIS — I4891 Unspecified atrial fibrillation: Secondary | ICD-10-CM | POA: Diagnosis not present

## 2019-09-12 DIAGNOSIS — N183 Chronic kidney disease, stage 3 unspecified: Secondary | ICD-10-CM | POA: Diagnosis not present

## 2019-09-12 DIAGNOSIS — H409 Unspecified glaucoma: Secondary | ICD-10-CM | POA: Diagnosis not present

## 2019-09-21 ENCOUNTER — Other Ambulatory Visit (HOSPITAL_COMMUNITY): Payer: Self-pay | Admitting: Physician Assistant

## 2019-10-13 DIAGNOSIS — Z0120 Encounter for dental examination and cleaning without abnormal findings: Secondary | ICD-10-CM | POA: Diagnosis not present

## 2019-10-16 DIAGNOSIS — I4891 Unspecified atrial fibrillation: Secondary | ICD-10-CM | POA: Diagnosis not present

## 2019-10-16 DIAGNOSIS — H409 Unspecified glaucoma: Secondary | ICD-10-CM | POA: Diagnosis not present

## 2019-10-16 DIAGNOSIS — N183 Chronic kidney disease, stage 3 unspecified: Secondary | ICD-10-CM | POA: Diagnosis not present

## 2019-10-28 DIAGNOSIS — H401123 Primary open-angle glaucoma, left eye, severe stage: Secondary | ICD-10-CM | POA: Diagnosis not present

## 2019-10-28 DIAGNOSIS — Z961 Presence of intraocular lens: Secondary | ICD-10-CM | POA: Diagnosis not present

## 2019-10-28 DIAGNOSIS — H401111 Primary open-angle glaucoma, right eye, mild stage: Secondary | ICD-10-CM | POA: Diagnosis not present

## 2019-10-28 DIAGNOSIS — H26491 Other secondary cataract, right eye: Secondary | ICD-10-CM | POA: Diagnosis not present

## 2019-11-05 DIAGNOSIS — H26492 Other secondary cataract, left eye: Secondary | ICD-10-CM | POA: Diagnosis not present

## 2019-12-03 DIAGNOSIS — N183 Chronic kidney disease, stage 3 unspecified: Secondary | ICD-10-CM | POA: Diagnosis not present

## 2019-12-03 DIAGNOSIS — H409 Unspecified glaucoma: Secondary | ICD-10-CM | POA: Diagnosis not present

## 2019-12-03 DIAGNOSIS — I4891 Unspecified atrial fibrillation: Secondary | ICD-10-CM | POA: Diagnosis not present

## 2019-12-29 ENCOUNTER — Other Ambulatory Visit: Payer: Self-pay | Admitting: Physician Assistant

## 2020-01-06 DIAGNOSIS — H401111 Primary open-angle glaucoma, right eye, mild stage: Secondary | ICD-10-CM | POA: Diagnosis not present

## 2020-01-06 DIAGNOSIS — Z961 Presence of intraocular lens: Secondary | ICD-10-CM | POA: Diagnosis not present

## 2020-01-06 DIAGNOSIS — H401123 Primary open-angle glaucoma, left eye, severe stage: Secondary | ICD-10-CM | POA: Diagnosis not present

## 2020-02-04 DIAGNOSIS — L570 Actinic keratosis: Secondary | ICD-10-CM | POA: Diagnosis not present

## 2020-02-04 DIAGNOSIS — L821 Other seborrheic keratosis: Secondary | ICD-10-CM | POA: Diagnosis not present

## 2020-02-04 DIAGNOSIS — D1801 Hemangioma of skin and subcutaneous tissue: Secondary | ICD-10-CM | POA: Diagnosis not present

## 2020-02-04 DIAGNOSIS — L738 Other specified follicular disorders: Secondary | ICD-10-CM | POA: Diagnosis not present

## 2020-02-18 ENCOUNTER — Encounter (HOSPITAL_COMMUNITY): Payer: Self-pay | Admitting: Nurse Practitioner

## 2020-02-18 ENCOUNTER — Ambulatory Visit (HOSPITAL_COMMUNITY)
Admission: RE | Admit: 2020-02-18 | Discharge: 2020-02-18 | Disposition: A | Discharge status: Home / Self Care | Payer: Medicare Other | Source: Ambulatory Visit | Attending: Nurse Practitioner | Admitting: Nurse Practitioner

## 2020-02-18 ENCOUNTER — Other Ambulatory Visit: Payer: Self-pay

## 2020-02-18 VITALS — BP 122/80 | HR 58 | Ht 74.0 in | Wt 207.2 lb

## 2020-02-18 DIAGNOSIS — Z7901 Long term (current) use of anticoagulants: Secondary | ICD-10-CM | POA: Insufficient documentation

## 2020-02-18 DIAGNOSIS — I4819 Other persistent atrial fibrillation: Secondary | ICD-10-CM | POA: Insufficient documentation

## 2020-02-18 DIAGNOSIS — D6869 Other thrombophilia: Secondary | ICD-10-CM

## 2020-02-18 DIAGNOSIS — R7303 Prediabetes: Secondary | ICD-10-CM | POA: Insufficient documentation

## 2020-02-18 DIAGNOSIS — M19011 Primary osteoarthritis, right shoulder: Secondary | ICD-10-CM | POA: Insufficient documentation

## 2020-02-18 LAB — BASIC METABOLIC PANEL
Anion gap: 9 (ref 5–15)
BUN: 15 mg/dL (ref 8–23)
CO2: 27 mmol/L (ref 22–32)
Calcium: 9 mg/dL (ref 8.9–10.3)
Chloride: 104 mmol/L (ref 98–111)
Creatinine, Ser: 0.91 mg/dL (ref 0.61–1.24)
GFR calc Af Amer: >60 mL/min (ref >60)
GFR calc non Af Amer: >60 mL/min (ref >60)
Glucose, Bld: 107 mg/dL — ABNORMAL HIGH (ref 70–99)
Potassium: 4.4 mmol/L (ref 3.5–5.1)
Sodium: 140 mmol/L (ref 135–145)

## 2020-02-18 LAB — MAGNESIUM: Magnesium: 2 mg/dL (ref 1.7–2.4)

## 2020-02-18 NOTE — Progress Notes (Signed)
Primary Care Physician: Leonard Huddle, MD Referring Physician: Dr. 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 Duration of afib unknown but thought to be persistent for months, possibly up to one year as he had not seen a MD since last yearly physical. He was seen by Cardiology who set pt up for cardioversion which was done after loading of Doac x 3 weeks with a chadsvasc score of 2(age, Recently dx pre didabetic). He did shock out but returned to afib after a few days. Pt recently lost 30 lbs over the last few months with dx of pre diabetes. Marland KitchenHe is pending a sleep study. No significant caffeine or alcohol use, no tobacco use. He is in the afib clinic to discuss options to return to [place].  He had a ETT 03/2016, which was low risk, echo showed normal heart function. Baseline EKG in afib shows LAFB as well as an EKG from 2017 in SR .He runs low 60's in SR without any AV blocking agents.  F/u in afib clinic, 10/15. I saw 10/10 and discussed options to restore SR, and pt wanted to thinka about options. He is in clinic today to come into hosptial for [place] admission.  F/u in afib clinic, 11/14, he initially was seen 10/25 after Tikosyn admit and was in [place]. He asked to be seen in the afib clinic today for irregularity in his heart rate. Ekg shows afib with 120 bpm. Later converted  on his own.  F/u in afib clinic for one month f/u after tikosyn. He is in SR today. BP stable.  F/u in afib clinic, 4/19. He remains in SR and feels well. Continue on dofetilide and xarelto, no issues.  F/u in afib clinic for surveillance of Tikosyn. He remains in SR, no afib noted and  feels well. He will be having an endoscopy in August and has been ok'ed per [facility] Williams, PharmD that he can stop xarelto 1-2 days before procedure.  F/u 10/14. He feels well. He has not noted  any afib on tikosyn. No issues with eliquis.  F/u in afib clinic 2/14. He continues on Tikosyn and is doing well, no afib reported. No issues with xarelto. qtc stable.  F/u in afib clinic 08/22/19 for Tikosyn surveillance. He does not report any afib. He and his wife both had mild covid cases after Christmas and since have received both of the vaccines. No issues with bleeding, continues on xarelto with CHA2DS2VASc score of 2.   In for tikosyn surveillance 8/11. He has not noted any breakthrough afib. He feels well. He has had both covid shots. No bleeding issues. Continues on xarelto for a CHA2DS2VASc score of 2.   Today, he denies symptoms of palpitations, chest pain, shortness of breath, orthopnea, PND, lower extremity edema, dizziness, presyncope, syncope, or neurologic sequela. The patient is tolerating medications without difficulties and is otherwise without complaint today.   Past Medical History:  Diagnosis Date  . Arthritis    mild arthritis- neck shoulders, more right shoulder-no problems at present.  . Atrial fibrillation, persistent (Leonard Williams)   . Cancer [facility])    GI tumor- stomach tumor "nonmalignant" last check 1 yr ago.  . Glaucoma   . Headache disorder 03/08/2015   Topamax taken daily-very low grade headache to none now.  . History of  colonic diverticulitis    left  . History of hiatal hernia    omeprazole as needed- "mild"  . [facility] Williams pupil [facility])    Past Surgical History:  Procedure Laterality Date  . CARDIOVERSION N/A 04/10/2017   Procedure: CARDIOVERSION;  Surgeon: Leonard Perla, MD;  Location: [facility] ENDOSCOPY;  Service: Cardiovascular;  Laterality: N/A;  . COLONOSCOPY N/A 01/21/2015   Procedure: COLONOSCOPY;  Surgeon: Leonard Fair, MD;  [facility] ENDOSCOPY;  Service: Endoscopy;  Laterality: N/A;-polyp removed in past-benign.  . ESOPHAGOGASTRODUODENOSCOPY (EGD) WITH PROPOFOL N/A 01/21/2015   Procedure: ESOPHAGOGASTRODUODENOSCOPY (EGD) WITH PROPOFOL;  Surgeon:  Leonard Fair, MD;  Location[facility] ENDOSCOPY;  Service: Endoscopy;  Laterality: N/A;  . EUS N/A 02/04/2015   Procedure: UPPER ENDOSCOPIC ULTRASOUND (EUS) LINEAR;  Surgeon: Leonard Banister, MD;  Location[facility] ENDOSCOPY;  Service: Endoscopy;  Laterality: N/A;  . EUS N/A 02/03/2016   Procedure: UPPER ENDOSCOPIC ULTRASOUND (EUS) RADIAL;  Surgeon: Leonard Banister, MD;  Location[facility] ENDOSCOPY;  Service: Endoscopy;  Laterality: N/A;  . EUS N/A 02/07/2018   Procedure: UPPER ENDOSCOPIC ULTRASOUND (EUS) RADIAL;  Surgeon: Leonard Banister, MD;  Location: [facility] ENDOSCOPY;  Service: Endoscopy;  Laterality: N/A;  . EYE SURGERY     laser surgery for glaucoma  . HERNIA REPAIR Left    groin  . TONSILLECTOMY      Current Outpatient Medications  Medication Sig Dispense Refill  . cholecalciferol (VITAMIN D) 1000 units tablet Take 1,000 Units by mouth daily.     Marland Kitchen dofetilide (TIKOSYN) 500 MCG capsule Take 1 capsule by mouth twice daily 60 capsule 5  . dorzolamide-timolol (COSOPT) 22.3-6.8 MG/ML ophthalmic solution Place 1 drop into both eyes 2 (two) times daily.     Marland Kitchen latanoprost (XALATAN) 0.005 % ophthalmic solution Place 1 drop into both eyes at bedtime.     . Multiple Vitamin (MULTIVITAMIN WITH MINERALS) TABS tablet Take 1 tablet by mouth daily.    . sildenafil (REVATIO) 20 MG tablet Take 100 mg by mouth daily as needed.  8  . XARELTO 20 MG TABS tablet TAKE 1 TABLET BY MOUTH ONCE DAILY WITH SUPPER 90 tablet 2   No current facility-administered medications for this encounter.    No Known Allergies  Social History   Socioeconomic History  . Marital status: Married    Spouse name: Not on file  . Number of children: 3  . Years of education: BA  . Highest education level: Not on file  Occupational History  . Occupation: retired  Tobacco Use  . Smoking status: Never Smoker  . Smokeless tobacco: Never Used  Vaping Use  . Vaping Use: Never used  Substance and Sexual Activity  . Alcohol use: Yes     Alcohol/week: 2.0 - 4.0 standard drinks    Types: 1 - 2 Cans of beer, 1 - 2 Standard drinks or equivalent per week    Comment: social - 1-2 beer per  week  . Drug use: No  . Sexual activity: Yes    Birth control/protection: None  Other Topics Concern  . Not on file  Social History Narrative   Patient drinks 2 cups of caffeine daily.   Patient is right handed.   Social Determinants of Health   Financial Resource Strain:   . Difficulty of Paying Living Expenses:   Food Insecurity:   . Worried About Charity fundraiser in the Last Year:   . [place] in the Last Year:  Transportation Needs:   . Film/video editor (Medical):   Marland Kitchen Lack of Transportation (Non-Medical):   Physical Activity:   . Days of Exercise per Week:   . Minutes of Exercise per Session:   Stress:   . Feeling of Stress :   Social Connections:   . Frequency of Communication with Friends and Family:   . Frequency of Social Gatherings with Friends and Family:   . Attends Religious Services:   . Active Member of Clubs or Organizations:   . Attends Archivist Meetings:   Marland Kitchen Marital Status:   Intimate Partner Violence:   . Fear of Current or Ex-Partner:   . Emotionally Abused:   Marland Kitchen Physically Abused:   . Sexually Abused:     Family History  Problem Relation Age of Onset  . Glaucoma Mother   . Dementia Father   . Graves' disease Sister   . Heart disease Sister   . Migraines Sister   . Heart attack Brother   . CAD Brother     ROS- All systems are reviewed and negative except as per the HPI above  Physical Exam: Vitals:   02/18/20 0941  BP: 122/80  Pulse: (!) 58  SpO2: 98%  Weight: 94 kg  Height: 6\' 2"  (1.88 m)   Wt Readings from Last 3 Encounters:  02/18/20 94 kg  08/21/19 92.4 kg  02/21/19 90.3 kg    Labs: Lab Results  Component Value Date   NA 140 08/21/2019   K 4.7 08/21/2019   CL 104 08/21/2019   CO2 27 08/21/2019   GLUCOSE 107 (H) 08/21/2019   BUN 13 08/21/2019     CREATININE 0.99 08/21/2019   CALCIUM 8.8 (L) 08/21/2019   MG 2.0 08/21/2019   No results found for: INR No results found for: CHOL, HDL, LDLCALC, TRIG   GEN- The patient is well appearing, alert and oriented x 3 today.   Head- normocephalic, atraumatic Eyes-  Sclera clear, conjunctiva pink Ears- hearing intact Oropharynx- clear Neck- Williams, no JVP Lymph- no cervical lymphadenopathy Lungs- Clear to ausculation bilaterally, normal work of breathing Heart-  regular rate and rhythm, no murmurs, rubs or gallops, PMI not laterally displaced GI- soft, NT, ND, + BS Extremities- no clubbing, cyanosis, or edema MS- no significant deformity or atrophy Skin- no rash or lesion Psych- euthymic mood, full affect Neuro- strength and sensation are intact  EKG- Sinus brady at 56  bpm, pr int 158  bpm, qrs int 88 ms, qtc 409 ms(stable)   Echo-Study Conclusions  - Left ventricle: The cavity size was normal. Wall thickness was   normal. Systolic function was normal. The estimated ejection   fraction was in the range of 50% to 55%. Wall motion was normal;   there were no regional wall motion abnormalities. Left   ventricular diastolic function parameters were normal. - Left atrium: The atrium was mildly dilated. Volume/bsa, ES,   (1-plane Simpson&'s, A2C): 43.4 ml/m^2. - Right atrium: The atrium was mildly dilated.  Impressions:  - Compared to the prior study, there has been no significant   interval change.   1.Assessment and Plan: Persistent  Afib Maintaining  SR Continue  Dofetilide at 500 mcg bid 30 mg cardizem if needed, but no episodes reported Continue xarelto with a chadsvasc score of 2  Bmet/mag today  F/u with afib clinic 6 months for Tikosyn surveillance  Leonard Williams, [facility] [address] [place], [place] [address] [phone]

## 2020-02-23 DIAGNOSIS — R319 Hematuria, unspecified: Secondary | ICD-10-CM | POA: Diagnosis not present

## 2020-03-14 ENCOUNTER — Other Ambulatory Visit (HOSPITAL_COMMUNITY): Payer: Self-pay | Admitting: Physician Assistant

## 2020-03-30 DIAGNOSIS — Z23 Encounter for immunization: Secondary | ICD-10-CM | POA: Diagnosis not present

## 2020-04-07 DIAGNOSIS — H409 Unspecified glaucoma: Secondary | ICD-10-CM | POA: Diagnosis not present

## 2020-04-07 DIAGNOSIS — I4891 Unspecified atrial fibrillation: Secondary | ICD-10-CM | POA: Diagnosis not present

## 2020-04-07 DIAGNOSIS — N183 Chronic kidney disease, stage 3 unspecified: Secondary | ICD-10-CM | POA: Diagnosis not present

## 2020-04-13 ENCOUNTER — Ambulatory Visit: Payer: Medicare Other | Attending: Internal Medicine

## 2020-04-13 DIAGNOSIS — Z23 Encounter for immunization: Secondary | ICD-10-CM

## 2020-04-13 NOTE — Progress Notes (Signed)
   Covid-19 Vaccination Clinic  Name:  Leonard Williams    MRN: [id number] DOB: 1946/07/09  04/13/2020  Mr. Leonard Williams was observed post Covid-19 immunization for 30 minutes based on pre-vaccination screening without incident. He was provided with Vaccine Information Sheet and instruction to access the V-Safe system.   Mr. Leonard Williams was instructed to call 911 with any severe reactions post vaccine: Marland Kitchen Difficulty breathing  . Swelling of face and throat  . A fast heartbeat  . A bad rash all over body  . Dizziness and weakness

## 2020-04-15 DIAGNOSIS — R1032 Left lower quadrant pain: Secondary | ICD-10-CM | POA: Diagnosis not present

## 2020-04-27 DIAGNOSIS — Z0120 Encounter for dental examination and cleaning without abnormal findings: Secondary | ICD-10-CM | POA: Diagnosis not present

## 2020-05-05 DIAGNOSIS — Z961 Presence of intraocular lens: Secondary | ICD-10-CM | POA: Diagnosis not present

## 2020-05-05 DIAGNOSIS — H401123 Primary open-angle glaucoma, left eye, severe stage: Secondary | ICD-10-CM | POA: Diagnosis not present

## 2020-05-05 DIAGNOSIS — H04203 Unspecified epiphora, bilateral lacrimal glands: Secondary | ICD-10-CM | POA: Diagnosis not present

## 2020-05-05 DIAGNOSIS — H401111 Primary open-angle glaucoma, right eye, mild stage: Secondary | ICD-10-CM | POA: Diagnosis not present

## 2020-05-06 ENCOUNTER — Other Ambulatory Visit: Payer: Self-pay

## 2020-05-06 ENCOUNTER — Ambulatory Visit (HOSPITAL_COMMUNITY)
Admission: RE | Admit: 2020-05-06 | Discharge: 2020-05-06 | Disposition: A | Discharge status: Home / Self Care | Payer: Medicare Other | Source: Ambulatory Visit | Attending: Nurse Practitioner | Admitting: Nurse Practitioner

## 2020-05-06 ENCOUNTER — Encounter (HOSPITAL_COMMUNITY): Payer: Self-pay | Admitting: Nurse Practitioner

## 2020-05-06 VITALS — BP 122/66 | HR 55 | Ht 74.0 in | Wt 206.6 lb

## 2020-05-06 DIAGNOSIS — D6869 Other thrombophilia: Secondary | ICD-10-CM | POA: Diagnosis not present

## 2020-05-06 DIAGNOSIS — Z79899 Other long term (current) drug therapy: Secondary | ICD-10-CM | POA: Insufficient documentation

## 2020-05-06 DIAGNOSIS — I4819 Other persistent atrial fibrillation: Secondary | ICD-10-CM | POA: Diagnosis not present

## 2020-05-06 DIAGNOSIS — Z7901 Long term (current) use of anticoagulants: Secondary | ICD-10-CM | POA: Diagnosis not present

## 2020-05-06 NOTE — Progress Notes (Signed)
Primary Care Physician: [name], MD Referring Physician: Dr. Marlou Porch EP: Dr. [name] is a 74 y.o. male with a h/o  afib that was found at the time of a wellness exam this year in August with rate bing controlled and pt fairly asymptomatic. Duration of afib unknown but thought to be persistent for months, possibly up to one year as he had not seen a MD since last yearly physical. He was seen by Cardiology who set pt up for cardioversion which was done after loading of Doac x 3 weeks with a chadsvasc score of 2(age, Recently dx pre didabetic). He did shock out but returned to afib after a few days. Pt recently lost 30 lbs over the last few months with dx of pre diabetes. Marland KitchenHe is pending a sleep study. No significant caffeine or alcohol use, no tobacco use. He is in the afib clinic to discuss options to return to [place].  He had a ETT 03/2016, which was low risk, echo showed normal heart function. Baseline EKG in afib shows LAFB as well as an EKG from 2017 in SR .He runs low 60's in SR without any AV blocking agents.  F/u in afib clinic, 10/15. I saw 10/10 and discussed options to restore SR, and pt wanted to thinka about options. He is in clinic today to come into hosptial for [facility] admission.  F/u in afib clinic, 11/14, he initially was seen 10/25 after Tikosyn admit and was in [place]. He asked to be seen in the afib clinic today for irregularity in his heart rate. Ekg shows afib with 120 bpm. Later converted  on his own.  F/u in afib clinic for one month f/u after tikosyn. He is in SR today. BP stable.  F/u in afib clinic, 4/19. He remains in SR and feels well. Continue on dofetilide and xarelto, no issues.  F/u in afib clinic for surveillance of Tikosyn. He remains in SR, no afib noted and  feels well. He will be having an endoscopy in August and has been ok'ed per M. Supple, PharmD that he can stop xarelto 1-2 days before procedure.  F/u 10/14. He feels well. He has not noted  any afib on tikosyn. No issues with eliquis.  F/u in afib clinic 2/14. He continues on Tikosyn and is doing well, no afib reported. No issues with xarelto. qtc stable.  F/u in afib clinic 08/22/19 for Tikosyn surveillance. He does not report any afib. He and his wife both had mild covid cases after Christmas and since have received both of the vaccines. No issues with bleeding, continues on xarelto with CHA2DS2VASc score of 2.   In for tikosyn surveillance 8/11. He has not noted any breakthrough afib. He feels well. He has had both covid shots. No bleeding issues. Continues on xarelto for a CHA2DS2VASc score of 2.   Pt asked to be seen today, 10/28, for irregular pulse he has noted over the last few days and was concerned he was in afib. His ekg today is normal with a stable qt on Tikosyn. By his description, it sounds like premature contractions. He  had covid booster 3 weeks ago but more recently had his last of the 3  hepatitis vaccines. He has also had a recent case of diverticulitis  treated by PCP. He has not other complaints today.   Today, he denies symptoms of palpitations, chest pain, shortness of breath, orthopnea, PND, lower extremity edema, dizziness, presyncope, syncope, or neurologic sequela.  The patient is tolerating medications without difficulties and is otherwise without complaint today.   Past Medical History:  Diagnosis Date  . Arthritis    mild arthritis- neck shoulders, more right shoulder-no problems at present.  . Atrial fibrillation, persistent ([place])   . Cancer [facility])    GI tumor- stomach tumor "nonmalignant" last check 1 yr ago.  . Glaucoma   . Headache disorder 03/08/2015   Topamax taken daily-very low grade headache to none now.  . History of colonic diverticulitis    left  . History of hiatal hernia    omeprazole as needed- "mild"  . 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 CARDIOVERSION N/A 04/10/2017   Procedure:  CARDIOVERSION;  Surgeon: Lelon Perla, MD;  Location: [facility] ENDOSCOPY;  Service: Cardiovascular;  Laterality: N/A;  . COLONOSCOPY N/A 01/21/2015   Procedure: COLONOSCOPY;  Surgeon: Garlan Fair, MD;  Location: [facility] ENDOSCOPY;  Service: Endoscopy;  Laterality: N/A;-polyp removed in past-benign.  . ESOPHAGOGASTRODUODENOSCOPY (EGD) WITH PROPOFOL N/A 01/21/2015   Procedure: ESOPHAGOGASTRODUODENOSCOPY (EGD) WITH PROPOFOL;  Surgeon: Garlan Fair, MD;  Location: [facility] ENDOSCOPY;  Service: Endoscopy;  Laterality: N/A;  . EUS N/A 02/04/2015   Procedure: UPPER ENDOSCOPIC ULTRASOUND (EUS) LINEAR;  Surgeon: Milus Banister, MD;  Location: [facility] ENDOSCOPY;  Service: Endoscopy;  Laterality: N/A;  . EUS N/A 02/03/2016   Procedure: UPPER ENDOSCOPIC ULTRASOUND (EUS) RADIAL;  Surgeon: Milus Banister, MD;  Location: [facility] ENDOSCOPY;  Service: Endoscopy;  Laterality: N/A;  . EUS N/A 02/07/2018   Procedure: UPPER ENDOSCOPIC ULTRASOUND (EUS) RADIAL;  Surgeon: Milus Banister, MD;  Location: [facility] ENDOSCOPY;  Service: Endoscopy;  Laterality: N/A;  . EYE SURGERY     laser surgery for glaucoma  . HERNIA REPAIR Left    groin  . TONSILLECTOMY      Current Outpatient Medications  Medication Sig Dispense Refill  . cholecalciferol (VITAMIN D) 1000 units tablet Take 1,000 Units by mouth daily.     Marland Kitchen dofetilide (TIKOSYN) 500 MCG capsule Take 1 capsule by mouth twice daily 60 capsule 6  . dorzolamide-timolol (COSOPT) 22.3-6.8 MG/ML ophthalmic solution Place 1 drop into both eyes 2 (two) times daily.     Marland Kitchen latanoprost (XALATAN) 0.005 % ophthalmic solution Place 1 drop into both eyes at bedtime.     . Multiple Vitamin (MULTIVITAMIN WITH MINERALS) TABS tablet Take 1 tablet by mouth daily.    . sildenafil (REVATIO) 20 MG tablet Take 100 mg by mouth daily as needed.  8  . XARELTO 20 MG TABS tablet TAKE 1 TABLET BY MOUTH ONCE DAILY WITH SUPPER 90 tablet 2   No current facility-administered medications for this encounter.    No  Known Allergies  Social History   Socioeconomic History  . Marital status: Married    Spouse name: Not on file  . Number of children: 3  . Years of education: BA  . Highest education level: Not on file  Occupational History  . Occupation: retired  Tobacco Use  . Smoking status: Never Smoker  . Smokeless tobacco: Never Used  Vaping Use  . Vaping Use: Never used  Substance and Sexual Activity  . Alcohol use: Yes    Alcohol/week: 2.0 - 4.0 standard drinks    Types: 1 - 2 Cans of beer, 1 - 2 Standard drinks or equivalent per week    Comment: social - 1-2 beer per  week  . Drug use: No  . Sexual  activity: Yes    Birth control/protection: None  Other Topics Concern  . Not on file  Social History Narrative   Patient drinks 2 cups of caffeine daily.   Patient is right handed.   Social Determinants of Health   Financial Resource Strain:   . Difficulty of Paying Living Expenses: Not on file  Food Insecurity:   . Worried About Charity fundraiser in the Last Year: Not on file  . Ran Out of Food in the Last Year: Not on file  Transportation Needs:   . Lack of Transportation (Medical): Not on file  . Lack of Transportation (Non-Medical): Not on file  Physical Activity:   . Days of Exercise per Week: Not on file  . Minutes of Exercise per Session: Not on file  Stress:   . Feeling of Stress : Not on file  Social Connections:   . Frequency of Communication with Friends and Family: Not on file  . Frequency of Social Gatherings with Friends and Family: Not on file  . Attends Religious Services: Not on file  . Active Member of Clubs or Organizations: Not on file  . Attends Archivist Meetings: Not on file  . Marital Status: Not on file  Intimate Partner Violence:   . Fear of Current or Ex-Partner: Not on file  . Emotionally Abused: Not on file  . Physically Abused: Not on file  . Sexually Abused: Not on file    Family History  Problem Relation Age of Onset  .  Glaucoma Mother   . Dementia Father   . Graves' disease Sister   . Heart disease Sister   . Migraines Sister   . Heart attack Brother   . CAD Brother     ROS- All systems are reviewed and negative except as per the HPI above  Physical Exam: Vitals:   05/06/20 0939  BP: 122/66  Pulse: (!) 55  Weight: 93.7 kg  Height: 6\' 2"  (1.88 m)   Wt Readings from Last 3 Encounters:  05/06/20 93.7 kg  02/18/20 94 kg  08/21/19 92.4 kg    Labs: Lab Results  Component Value Date   NA 140 02/18/2020   K 4.4 02/18/2020   CL 104 02/18/2020   CO2 27 02/18/2020   GLUCOSE 107 (H) 02/18/2020   BUN 15 02/18/2020   CREATININE 0.91 02/18/2020   CALCIUM 9.0 02/18/2020   MG 2.0 02/18/2020   No results found for: INR No results found for: CHOL, HDL, LDLCALC, TRIG   GEN- The patient is well appearing, alert and oriented x 3 today.   Head- normocephalic, atraumatic Eyes-  Sclera clear, conjunctiva pink Ears- hearing intact Oropharynx- clear Neck- supple, no JVP Lymph- no cervical lymphadenopathy Lungs- Clear to ausculation bilaterally, normal work of breathing Heart-  regular rate and rhythm, no murmurs, rubs or gallops, PMI not laterally displaced GI- soft, NT, ND, + BS Extremities- no clubbing, cyanosis, or edema MS- no significant deformity or atrophy Skin- no rash or lesion Psych- euthymic mood, full affect Neuro- strength and sensation are intact  EKG- Sinus brady at 55  bpm, pr int 188  bpm, qrs int 106 ms, qtc 411 ms(stable)   Echo-Study Conclusions  - Left ventricle: The cavity size was normal. Wall thickness was   normal. Systolic function was normal. The estimated ejection   fraction was in the range of 50% to 55%. Wall motion was normal;   there were no regional wall motion abnormalities. Left  ventricular diastolic function parameters were normal. - Left atrium: The atrium was mildly dilated. Volume/bsa, ES,   (1-plane Simpson&'s, A2C): 43.4 ml/m^2. - Right atrium:  The atrium was mildly dilated.  Impressions:  - Compared to the prior study, there has been no significant   interval change.   1.Assessment and Plan: Persistent  Afib Maintaining  SR Recent sense of irregular heart beat, by his description,sounds like premature contractions  None present today  Continue  Dofetilide at 500 mcg bid 30 mg cardizem if needed, for afib , but no episodes reported Continue xarelto with a chadsvasc score of 2  If  the pc's continue to bother him, pt can contact the office and I will place a one week zio patch and update echo, but for now, he is reassured and is comfortable to watch going forward   F/u with afib clinic as scheduled  for Tikosyn surveillance  [place]. Lelar Farewell, [facility] [address] [place], [place] [address] [phone]

## 2020-06-09 DIAGNOSIS — E559 Vitamin D deficiency, unspecified: Secondary | ICD-10-CM | POA: Diagnosis not present

## 2020-06-09 DIAGNOSIS — I4891 Unspecified atrial fibrillation: Secondary | ICD-10-CM | POA: Diagnosis not present

## 2020-06-09 DIAGNOSIS — Z1389 Encounter for screening for other disorder: Secondary | ICD-10-CM | POA: Diagnosis not present

## 2020-06-09 DIAGNOSIS — H409 Unspecified glaucoma: Secondary | ICD-10-CM | POA: Diagnosis not present

## 2020-06-09 DIAGNOSIS — D214 Benign neoplasm of connective and other soft tissue of abdomen: Secondary | ICD-10-CM | POA: Diagnosis not present

## 2020-06-09 DIAGNOSIS — Z0000 Encounter for general adult medical examination without abnormal findings: Secondary | ICD-10-CM | POA: Diagnosis not present

## 2020-07-15 ENCOUNTER — Telehealth (HOSPITAL_COMMUNITY): Payer: Self-pay | Admitting: *Deleted

## 2020-07-15 NOTE — Telephone Encounter (Signed)
Pt approved for tier exception through 07/13/21 for dofetilide.

## 2020-07-30 ENCOUNTER — Ambulatory Visit: Payer: Medicare Other | Admitting: Gastroenterology

## 2020-07-30 ENCOUNTER — Other Ambulatory Visit: Payer: Self-pay

## 2020-07-30 ENCOUNTER — Encounter: Payer: Self-pay | Admitting: Gastroenterology

## 2020-07-30 VITALS — BP 116/58 | HR 70 | Ht 74.0 in | Wt 210.0 lb

## 2020-07-30 DIAGNOSIS — K319 Disease of stomach and duodenum, unspecified: Secondary | ICD-10-CM | POA: Diagnosis not present

## 2020-07-30 DIAGNOSIS — Z86010 Personal history of colonic polyps: Secondary | ICD-10-CM | POA: Diagnosis not present

## 2020-07-30 NOTE — Patient Instructions (Addendum)
If you are age 74 or older, your body mass index should be between 23-30. Your Body mass index is 26.96 kg/m. If this is out of the aforementioned range listed, please consider follow up with your Primary Care Provider.  As you requested we plan to schedule you for your procedure in April 2022.  We do not have our schedule out for the month of April.  Please contact our office at [phone] at the end of February to schedule colonoscopy and EUS at [facility].    Once you procedure has been scheduled, we will contact Dr Jackalyn Lombard office in regards to holding your Eliquis prior.  Please contact our office with any questions or concerns that you may have.  Thank you for entrusting me with your care and choosing [facility].  Dr Ardis Hughs

## 2020-07-30 NOTE — Progress Notes (Signed)
Review of pertinent gastrointestinal problems: 1. history of small precancerous colon polyp.  Colonoscopy Dr. Wynetta Emery 01/2015: indication "22mm SSA removed in 2013;" findings no polyps.   2. Gastric subepithelial lesion, referred by Dr. Wynetta Emery 2016: EUS 2016 11.69mm lesion, presumed to be a very small GIST; EUS 2017 no signficant change, 11.30mm.  EUS August 2019 lesion measured 11.2 mm.   HPI: This is a very pleasant 75 year old man   I last saw him about 2 years ago at the time of an endoscopic ultrasound procedure.  He is here to discuss that as well as a problem that we have never discussed before.  He has had colon polyps, I subcentimeter sessile serrated adenoma removed in 2013.  Colonoscopy 3 years after that found no polyps.  Those were done by Dr. Howell Rucks.  Dr. Wynetta Emery had recommended repeat colonoscopy at about 5-year interval.  The patient and I discussed the evolution of the polyp surveillance intervals fact that he is in a bit of a gray area and might not need another colonoscopy until 2026 for routine screening and surveillance.  He has no overt GI bleeding, no significant abdominal pains, no change in bowels, no unintentional weight loss  We discussed his gastric nodule which we presumed to be a very small just tumor of the stomach.  He has no nausea, no vomiting, no significant abdominal pains.  He is on Xarelto for atrial fibrillation, prescribed by his cardiologist    Review of systems: Pertinent positive and negative review of systems were noted in the above HPI section. All other review negative.   Past Medical History:  Diagnosis Date  . Arthritis    mild arthritis- neck shoulders, more right shoulder-no problems at present.  . Atrial fibrillation, persistent ([place])   . Cancer [facility])    GI tumor- stomach tumor "nonmalignant" last check 1 yr ago.  . Glaucoma   . Headache disorder 03/08/2015   Topamax taken daily-very low grade headache to none now.  . History of  colonic diverticulitis    left  . History of hiatal hernia    omeprazole as needed- "mild"  . 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 CARDIOVERSION N/A 04/10/2017   Procedure: CARDIOVERSION;  Surgeon: Lelon Perla, MD;  Location[facility] ENDOSCOPY;  Service: Cardiovascular;  Laterality: N/A;  . COLONOSCOPY N/A 01/21/2015   Procedure: COLONOSCOPY;  Surgeon: Garlan Fair, MD;  Location[facility] ENDOSCOPY;  Service: Endoscopy;  Laterality: N/A;-polyp removed in past-benign.  . ESOPHAGOGASTRODUODENOSCOPY (EGD) WITH PROPOFOL N/A 01/21/2015   Procedure: ESOPHAGOGASTRODUODENOSCOPY (EGD) WITH PROPOFOL;  Surgeon: Garlan Fair, MD;  Location: [facility] ENDOSCOPY;  Service: Endoscopy;  Laterality: N/A;  . EUS N/A 02/04/2015   Procedure: UPPER ENDOSCOPIC ULTRASOUND (EUS) LINEAR;  Surgeon: Milus Banister, MD;  Location: [facility] ENDOSCOPY;  Service: Endoscopy;  Laterality: N/A;  . EUS N/A 02/03/2016   Procedure: UPPER ENDOSCOPIC ULTRASOUND (EUS) RADIAL;  Surgeon: Milus Banister, MD;  Location: [facility] ENDOSCOPY;  Service: Endoscopy;  Laterality: N/A;  . EUS N/A 02/07/2018   Procedure: UPPER ENDOSCOPIC ULTRASOUND (EUS) RADIAL;  Surgeon: Milus Banister, MD;  Location: [facility] ENDOSCOPY;  Service: Endoscopy;  Laterality: N/A;  . EYE SURGERY     laser surgery for glaucoma  . HERNIA REPAIR Left    groin  . TONSILLECTOMY      Current Outpatient Medications  Medication Sig Dispense Refill  . cholecalciferol (VITAMIN D) 1000 units tablet Take 1,000 Units  by mouth daily.     Marland Kitchen dofetilide (TIKOSYN) 500 MCG capsule Take 1 capsule by mouth twice daily 60 capsule 6  . dorzolamide-timolol (COSOPT) 22.3-6.8 MG/ML ophthalmic solution Place 1 drop into both eyes 2 (two) times daily.     Marland Kitchen latanoprost (XALATAN) 0.005 % ophthalmic solution Place 1 drop into both eyes at bedtime.     . Multiple Vitamin (MULTIVITAMIN WITH MINERALS) TABS tablet Take 1 tablet by mouth daily.    . sildenafil  (REVATIO) 20 MG tablet Take 100 mg by mouth daily as needed.  8  . XARELTO 20 MG TABS tablet TAKE 1 TABLET BY MOUTH ONCE DAILY WITH SUPPER 90 tablet 2   No current facility-administered medications for this visit.    Allergies as of 07/30/2020  . (No Known Allergies)    Family History  Problem Relation Age of Onset  . Glaucoma Mother   . Dementia Father   . Graves' disease Sister   . Heart disease Sister   . Migraines Sister   . Heart attack Brother   . CAD Brother     Social History   Socioeconomic History  . Marital status: Married    Spouse name: Not on file  . Number of children: 3  . Years of education: BA  . Highest education level: Not on file  Occupational History  . Occupation: retired  Tobacco Use  . Smoking status: Never Smoker  . Smokeless tobacco: Never Used  Vaping Use  . Vaping Use: Never used  Substance and Sexual Activity  . Alcohol use: Yes    Alcohol/week: 2.0 - 4.0 standard drinks    Types: 1 - 2 Cans of beer, 1 - 2 Standard drinks or equivalent per week    Comment: social - 1-2 beer per  week  . Drug use: No  . Sexual activity: Yes    Birth control/protection: None  Other Topics Concern  . Not on file  Social History Narrative   Patient drinks 2 cups of caffeine daily.   Patient is right handed.   Social Determinants of Health   Financial Resource Strain: Not on file  Food Insecurity: Not on file  Transportation Needs: Not on file  Physical Activity: Not on file  Stress: Not on file  Social Connections: Not on file  Intimate Partner Violence: Not on file     Physical Exam: Ht 6\' 2"  (1.88 m)   Wt 210 lb (95.3 kg)   BMI 26.96 kg/m  Constitutional: generally well-appearing Psychiatric: alert and oriented x3 Eyes: extraocular movements intact Mouth: oral pharynx moist, no lesions Neck: supple no lymphadenopathy Cardiovascular: heart regular rate and rhythm Lungs: clear to auscultation bilaterally Abdomen: soft, nontender,  nondistended, no obvious ascites, no peritoneal signs, normal bowel sounds Extremities: no lower extremity edema bilaterally Skin: no lesions on visible extremities   Assessment and plan: 75 y.o. male with history of precancerous colon polyps, history of gastric nodule presumed to be a small GIST, atrial fibrillation on chronic blood thinner  I recommend that we proceed for surveillance of the presumed gastric GIST, he understands it is "very small" by NCCN guidelines.  If it proves to still have not increased in size since first noticed in 2016 that I think further surveillance will probably be unnecessary.  At the same time I will examine his pancreas as best as possible, his mother died of pancreatic cancer.  Same day as the upper endoscopy we will proceed with repeat colonoscopy for his history of  precancerous colon polyps.  He does understand that the polyp which was removed by another provider in 2013 was a small sessile serrated adenoma, guidelines for follow-up on these have not been very well worked out.  Fortunately 2016 no polyps were removed.  He is at increased risk for bleeding complication during these procedures and so I asked that he hold his Xarelto medicine for 2 days prior to the colonoscopy.  We will check with his cardiologist to see if he agrees with that recommendation.  Please see the "Patient Instructions" section for addition details about the plan.   Owens Loffler, MD [facility] 07/30/2020, 10:15 AM  Cc: [name], MD  Total time on date of encounter was 46  minutes (this included time spent preparing to see the patient reviewing records; obtaining and/or reviewing separately obtained history; performing a medically appropriate exam and/or evaluation; counseling and educating the patient and family if present; ordering medications, tests or procedures if applicable; and documenting clinical information in the health record).

## 2020-08-19 ENCOUNTER — Encounter (HOSPITAL_COMMUNITY): Payer: Self-pay | Admitting: Nurse Practitioner

## 2020-08-19 ENCOUNTER — Ambulatory Visit (HOSPITAL_COMMUNITY)
Admission: RE | Admit: 2020-08-19 | Discharge: 2020-08-19 | Disposition: A | Discharge status: Home / Self Care | Payer: Medicare Other | Source: Ambulatory Visit | Attending: Nurse Practitioner | Admitting: Nurse Practitioner

## 2020-08-19 ENCOUNTER — Other Ambulatory Visit: Payer: Self-pay

## 2020-08-19 VITALS — BP 124/62 | HR 59 | Ht 74.0 in | Wt 213.4 lb

## 2020-08-19 DIAGNOSIS — D6869 Other thrombophilia: Secondary | ICD-10-CM | POA: Diagnosis not present

## 2020-08-19 DIAGNOSIS — I444 Left anterior fascicular block: Secondary | ICD-10-CM | POA: Insufficient documentation

## 2020-08-19 DIAGNOSIS — I4819 Other persistent atrial fibrillation: Secondary | ICD-10-CM | POA: Diagnosis not present

## 2020-08-19 LAB — BASIC METABOLIC PANEL
Anion gap: 10 (ref 5–15)
BUN: 16 mg/dL (ref 8–23)
CO2: 29 mmol/L (ref 22–32)
Calcium: 9.1 mg/dL (ref 8.9–10.3)
Chloride: 103 mmol/L (ref 98–111)
Creatinine, Ser: 1.17 mg/dL (ref 0.61–1.24)
GFR, Estimated: >60 mL/min (ref >60)
Glucose, Bld: 95 mg/dL (ref 70–99)
Potassium: 4.7 mmol/L (ref 3.5–5.1)
Sodium: 142 mmol/L (ref 135–145)

## 2020-08-19 LAB — MAGNESIUM: Magnesium: 2.2 mg/dL (ref 1.7–2.4)

## 2020-08-19 NOTE — Progress Notes (Addendum)
Primary Care Physician: [name], MD Referring Physician: Dr. 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 male with a h/o  afib that was found at the time of a wellness exam in August 2018 with rate bing controlled and pt fairly asymptomatic. Duration of afib unknown but thought to be persistent for months, possibly up to one year as he had not seen a MD since last yearly physical. He was seen by Cardiology who set pt up for cardioversion which was done after loading of Doac x 3 weeks with a chadsvasc score of 2(age, Recently dx pre didabetic). He did shock out but returned to afib after a few days. Pt recently lost 30 lbs over the last few months with dx of pre diabetes. Marland KitchenHe is pending a sleep study. No significant caffeine or alcohol use, no tobacco use. He is in the afib clinic to discuss options to return to [place].  He had a ETT 03/2016, which was low risk, echo showed normal heart function. Baseline EKG in afib shows LAFB as well as an EKG from 2017 in SR .He runs low 60's in SR without any AV blocking agents.  F/u in afib clinic, 10/15. I saw 10/10 and discussed options to restore SR, and pt wanted to thinka about options. He is in clinic today to come into hosptial for [facility] admission.  F/u in afib clinic, 11/14, he initially was seen 10/25 after Tikosyn admit and was in [place]. He asked to be seen in the afib clinic today for irregularity in his heart rate. Ekg shows afib with 120 bpm. Later converted  on his own.  F/u in afib clinic for one month f/u after tikosyn. He is in SR today. BP stable.  F/u in afib clinic, 4/19. He remains in SR and feels well. Continue on dofetilide and xarelto, no issues.  F/u in afib clinic for surveillance of Tikosyn. He remains in SR, no afib noted and  feels well. He will be having an endoscopy in August and has been ok'ed per M. Supple, PharmD that he can stop xarelto 1-2 days before procedure.  F/u 10/14. He feels well. He has not noted any  afib on tikosyn. No issues with eliquis.  F/u in afib clinic 2/14. He continues on Tikosyn and is doing well, no afib reported. No issues with xarelto. qtc stable.  F/u in afib clinic 08/22/19 for Tikosyn surveillance. He does not report any afib. He and his wife both had mild covid cases after Christmas and since have received both of the vaccines. No issues with bleeding, continues on xarelto with CHA2DS2VASc score of 2.   In for tikosyn surveillance 8/11. He has not noted any breakthrough afib. He feels well. He has had both covid shots. No bleeding issues. Continues on xarelto for a CHA2DS2VASc score of 2.   Pt asked to be seen today, 10/28, for irregular pulse he has noted over the last few days and was concerned he was in afib. His ekg today is normal with a stable qt on Tikosyn. By his description, it sounds like premature contractions. He  had covid booster 3 weeks ago but more recently had his last of the 3  hepatitis vaccines. He has also had a recent case of diverticulitis  treated by PCP. He has not other complaints today.   F/u in afib clinic, 08/19/20, f/u visit for tikosyn surveillance. Qtc is stable. No afib noted. He has been well and feels good.  No issues with anticoagulation.   Today, he denies symptoms of palpitations, chest pain, shortness of breath, orthopnea, PND, lower extremity edema, dizziness, presyncope, syncope, or neurologic sequela. The patient is tolerating medications without difficulties and is otherwise without complaint today.   Past Medical History:  Diagnosis Date  . Arthritis    mild arthritis- neck shoulders, more right shoulder-no problems at present.  . Atrial fibrillation, persistent ([place])   . Cancer [facility])    GI tumor- stomach tumor "nonmalignant" last check 1 yr ago.  . Glaucoma   . Headache disorder 03/08/2015   Topamax taken daily-very low grade headache to none now.  . History of colonic diverticulitis    left  . History of hiatal hernia     omeprazole as needed- "mild"  . 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 CARDIOVERSION N/A 04/10/2017   Procedure: CARDIOVERSION;  Surgeon: Lelon Perla, MD;  Location: [facility] ENDOSCOPY;  Service: Cardiovascular;  Laterality: N/A;  . COLONOSCOPY N/A 01/21/2015   Procedure: COLONOSCOPY;  Surgeon: Garlan Fair, MD;  Location: [facility] ENDOSCOPY;  Service: Endoscopy;  Laterality: N/A;-polyp removed in past-benign.  . ESOPHAGOGASTRODUODENOSCOPY (EGD) WITH PROPOFOL N/A 01/21/2015   Procedure: ESOPHAGOGASTRODUODENOSCOPY (EGD) WITH PROPOFOL;  Surgeon: Garlan Fair, MD;  Location: [facility] ENDOSCOPY;  Service: Endoscopy;  Laterality: N/A;  . EUS N/A 02/04/2015   Procedure: UPPER ENDOSCOPIC ULTRASOUND (EUS) LINEAR;  Surgeon: Milus Banister, MD;  Location: [facility] ENDOSCOPY;  Service: Endoscopy;  Laterality: N/A;  . EUS N/A 02/03/2016   Procedure: UPPER ENDOSCOPIC ULTRASOUND (EUS) RADIAL;  Surgeon: Milus Banister, MD;  Location: [facility] ENDOSCOPY;  Service: Endoscopy;  Laterality: N/A;  . EUS N/A 02/07/2018   Procedure: UPPER ENDOSCOPIC ULTRASOUND (EUS) RADIAL;  Surgeon: Milus Banister, MD;  Location: [facility] ENDOSCOPY;  Service: Endoscopy;  Laterality: N/A;  . EYE SURGERY     laser surgery for glaucoma  . HERNIA REPAIR Left    groin  . TONSILLECTOMY      Current Outpatient Medications  Medication Sig Dispense Refill  . cholecalciferol (VITAMIN D) 1000 units tablet Take 1,000 Units by mouth daily.     Marland Kitchen dofetilide (TIKOSYN) 500 MCG capsule Take 1 capsule by mouth twice daily 60 capsule 6  . dorzolamide-timolol (COSOPT) 22.3-6.8 MG/ML ophthalmic solution Place 1 drop into both eyes 2 (two) times daily.     Marland Kitchen latanoprost (XALATAN) 0.005 % ophthalmic solution Place 1 drop into both eyes at bedtime.     . Multiple Vitamin (MULTIVITAMIN WITH MINERALS) TABS tablet Take 1 tablet by mouth daily.    . sildenafil (REVATIO) 20 MG tablet Take 100 mg by mouth daily as needed.  8  . XARELTO  20 MG TABS tablet TAKE 1 TABLET BY MOUTH ONCE DAILY WITH SUPPER 90 tablet 2  . tacrolimus (PROTOPIC) 0.1 % ointment 1 application (Patient not taking: Reported on 08/19/2020)     No current facility-administered medications for this encounter.    No Known Allergies  Social History   Socioeconomic History  . Marital status: Married    Spouse name: Not on file  . Number of children: 3  . Years of education: BA  . Highest education level: Not on file  Occupational History  . Occupation: retired  Tobacco Use  . Smoking status: Never Smoker  . Smokeless tobacco: Never Used  Vaping Use  . Vaping Use: Never used  Substance and Sexual Activity  . Alcohol use: Yes  Alcohol/week: 2.0 - 4.0 standard drinks    Types: 1 - 2 Cans of beer, 1 - 2 Standard drinks or equivalent per week    Comment: social - 1-2 beer per  week  . Drug use: No  . Sexual activity: Yes    Birth control/protection: None  Other Topics Concern  . Not on file  Social History Narrative   Patient drinks 2 cups of caffeine daily.   Patient is right handed.   Social Determinants of Health   Financial Resource Strain: Not on file  Food Insecurity: Not on file  Transportation Needs: Not on file  Physical Activity: Not on file  Stress: Not on file  Social Connections: Not on file  Intimate Partner Violence: Not on file    Family History  Problem Relation Age of Onset  . Glaucoma Mother   . Dementia Father   . Graves' disease Sister   . Heart disease Sister   . Migraines Sister   . Heart attack Brother   . CAD Brother     ROS- All systems are reviewed and negative except as per the HPI above  Physical Exam: Vitals:   08/19/20 0928  BP: 124/62  Pulse: (!) 59  Weight: 96.8 kg  Height: 6\' 2"  (1.88 m)   Wt Readings from Last 3 Encounters:  08/19/20 96.8 kg  07/30/20 95.3 kg  05/06/20 93.7 kg    Labs: Lab Results  Component Value Date   NA 140 02/18/2020   K 4.4 02/18/2020   CL 104  02/18/2020   CO2 27 02/18/2020   GLUCOSE 107 (H) 02/18/2020   BUN 15 02/18/2020   CREATININE 0.91 02/18/2020   CALCIUM 9.0 02/18/2020   MG 2.0 02/18/2020   No results found for: INR No results found for: CHOL, HDL, LDLCALC, TRIG   GEN- The patient is well appearing, alert and oriented x 3 today.   Head- normocephalic, atraumatic Eyes-  Sclera clear, conjunctiva pink Ears- hearing intact Oropharynx- clear Neck- supple, no JVP Lymph- no cervical lymphadenopathy Lungs- Clear to ausculation bilaterally, normal work of breathing Heart-  regular rate and rhythm, no murmurs, rubs or gallops, PMI not laterally displaced GI- soft, NT, ND, + BS Extremities- no clubbing, cyanosis, or edema MS- no significant deformity or atrophy Skin- no rash or lesion Psych- euthymic mood, full affect Neuro- strength and sensation are intact  EKG- Sinus brady at 59  bpm, pr int 182  bpm, qrs int 106 ms, qtc 425 ms(stable), LAFB   Echo-Study Conclusions  - Left ventricle: The cavity size was normal. Wall thickness was   normal. Systolic function was normal. The estimated ejection   fraction was in the range of 50% to 55%. Wall motion was normal;   there were no regional wall motion abnormalities. Left   ventricular diastolic function parameters were normal. - Left atrium: The atrium was mildly dilated. Volume/bsa, ES,   (1-plane Simpson&'s, A2C): 43.4 ml/m^2. - Right atrium: The atrium was mildly dilated.  Impressions:  - Compared to the prior study, there has been no significant   interval change.   1.Assessment and Plan: Persistent  Afib Maintaining  SR Has not noted any afib  Continue  Dofetilide at 500 mcg bid Has 30  mg cardizem if needed, for afib , but no episodes reported Continue xarelto with a chadsvasc score of 2  Bmet/mag today   F/u with afib clinic in 6 months   for Tikosyn surveillance  Butch Penny C. Kayleen Memos, ANP-C Afib  [facility] [address] [place], [place] [address] [phone]

## 2020-09-01 ENCOUNTER — Other Ambulatory Visit: Payer: Self-pay

## 2020-09-01 ENCOUNTER — Telehealth: Payer: Self-pay

## 2020-09-01 DIAGNOSIS — Z86010 Personal history of colonic polyps: Secondary | ICD-10-CM

## 2020-09-01 DIAGNOSIS — K319 Disease of stomach and duodenum, unspecified: Secondary | ICD-10-CM

## 2020-09-01 NOTE — Addendum Note (Signed)
Addended by: Stevan Born on: 09/01/2020 10:42 AM   Modules accepted: Orders

## 2020-09-01 NOTE — Telephone Encounter (Signed)
[facility] Pre-operative Risk Assessment     Request for surgical clearance:     Endoscopy Procedure  What type of surgery is being performed?     Colonoscopy, upper EUS  When is this surgery scheduled?     10-21-2020  What type of clearance is required ?   Pharmacy  Are there any medications that need to be held prior to surgery and how long?  Xarelto x 2 days  Practice name and name of physician performing surgery?      [facility]  What is your office phone and fax number?      Phone- [phone]  [phone]  Anesthesia type (None, local, MAC, general) ?       MAC

## 2020-09-01 NOTE — Telephone Encounter (Signed)
   Primary Cardiologist: Dr Marlou Porch  Chart reviewed as part of pre-operative protocol coverage. Given past medical history and time since last visit, based on ACC/AHA guidelines, Leonard Williams would be at acceptable risk for the planned procedure without further cardiovascular testing.   Based on prior recommendations OK to hold Xarelto 2 days pre op and resume as soon as safe post op.   The patient was advised that if he develops new symptoms prior to surgery to contact our office to arrange for a follow-up visit, and he verbalized understanding.  I will route this recommendation to the requesting party via Epic fax function and remove from pre-op pool.  Please call with questions.  Kerin Ransom, PA-C 09/01/2020, 10:56 AM

## 2020-09-01 NOTE — Telephone Encounter (Signed)
Patient was advised that he has been cleared to hold Xarelto 2 days prior to procedure scheduled on 10-21-2020.  Patient advised to take last dose of Xarelto on 10-18-2020, and Dr Ardis Hughs will advised him when to restart after procedure.  Patient agreed to plan and verbalized understanding.  No further questions.

## 2020-09-06 DIAGNOSIS — H401111 Primary open-angle glaucoma, right eye, mild stage: Secondary | ICD-10-CM | POA: Diagnosis not present

## 2020-09-06 DIAGNOSIS — Z961 Presence of intraocular lens: Secondary | ICD-10-CM | POA: Diagnosis not present

## 2020-09-06 DIAGNOSIS — H401123 Primary open-angle glaucoma, left eye, severe stage: Secondary | ICD-10-CM | POA: Diagnosis not present

## 2020-09-06 DIAGNOSIS — H04203 Unspecified epiphora, bilateral lacrimal glands: Secondary | ICD-10-CM | POA: Diagnosis not present

## 2020-10-15 ENCOUNTER — Other Ambulatory Visit: Payer: Self-pay

## 2020-10-17 ENCOUNTER — Other Ambulatory Visit: Payer: Self-pay | Admitting: Physician Assistant

## 2020-10-17 ENCOUNTER — Other Ambulatory Visit (HOSPITAL_COMMUNITY): Payer: Self-pay | Admitting: Nurse Practitioner

## 2020-10-18 ENCOUNTER — Other Ambulatory Visit (HOSPITAL_COMMUNITY)
Admission: RE | Admit: 2020-10-18 | Discharge: 2020-10-18 | Disposition: A | Discharge status: Home / Self Care | Payer: Medicare Other | Source: Ambulatory Visit | Attending: Gastroenterology | Admitting: Gastroenterology

## 2020-10-18 DIAGNOSIS — Z01812 Encounter for preprocedural laboratory examination: Secondary | ICD-10-CM | POA: Insufficient documentation

## 2020-10-18 DIAGNOSIS — Z20822 Contact with and (suspected) exposure to covid-19: Secondary | ICD-10-CM | POA: Diagnosis not present

## 2020-10-18 LAB — SARS CORONAVIRUS 2 (TAT 6-24 HRS): SARS Coronavirus 2: NEGATIVE

## 2020-10-20 NOTE — Anesthesia Preprocedure Evaluation (Addendum)
Anesthesia Evaluation  Patient identified by MRN, date of birth, ID band Patient awake    Reviewed: Allergy & Precautions, NPO status , Patient's Chart, lab work & pertinent test results  History of Anesthesia Complications Negative for: history of anesthetic complications  Airway Mallampati: I       Dental no notable dental hx.    Pulmonary neg pulmonary ROS,    Pulmonary exam normal        Cardiovascular Exercise Tolerance: Good Normal cardiovascular exam+ dysrhythmias Atrial Fibrillation    '18 TTE - EF 50% to 55%. Both atria were mildly dilated.    Neuro/Psych  Headaches,  [name] Pupil  negative psych ROS   GI/Hepatic Neg liver ROS, hiatal hernia,  Gastric tumor    Endo/Other  negative endocrine ROS  Renal/GU negative Renal ROS  negative genitourinary   Musculoskeletal  (+) Arthritis ,   Abdominal Normal abdominal exam  (+)   Peds  Hematology negative hematology ROS (+)   Anesthesia Other Findings   Reproductive/Obstetrics                            Anesthesia Physical  Anesthesia Plan  ASA: III  Anesthesia Plan: MAC   Post-op Pain Management:    Induction: Intravenous  PONV Risk Score and Plan: 1 and Propofol infusion and Treatment may vary due to age or medical condition  Airway Management Planned: Natural Airway and Mask  Additional Equipment: None  Intra-op Plan:   Post-operative Plan:   Informed Consent: I have reviewed the patients History and Physical, chart, labs and discussed the procedure including the risks, benefits and alternatives for the proposed anesthesia with the patient or authorized representative who has indicated his/her understanding and acceptance.     Dental advisory given  Plan Discussed with: CRNA  Anesthesia Plan Comments:       Anesthesia Quick Evaluation

## 2020-10-21 ENCOUNTER — Ambulatory Visit (HOSPITAL_COMMUNITY): Payer: Medicare Other | Admitting: Anesthesiology

## 2020-10-21 ENCOUNTER — Encounter (HOSPITAL_COMMUNITY)
Admission: RE | Disposition: A | Discharge status: Home / Self Care | Payer: Self-pay | Source: Home / Self Care | Attending: Gastroenterology

## 2020-10-21 ENCOUNTER — Encounter (HOSPITAL_COMMUNITY): Payer: Self-pay | Admitting: Gastroenterology

## 2020-10-21 ENCOUNTER — Ambulatory Visit (HOSPITAL_COMMUNITY)
Admission: RE | Admit: 2020-10-21 | Discharge: 2020-10-21 | Disposition: A | Discharge status: Home / Self Care | Payer: Medicare Other | Attending: Gastroenterology | Admitting: Gastroenterology

## 2020-10-21 ENCOUNTER — Other Ambulatory Visit: Payer: Self-pay

## 2020-10-21 DIAGNOSIS — Z1211 Encounter for screening for malignant neoplasm of colon: Secondary | ICD-10-CM | POA: Insufficient documentation

## 2020-10-21 DIAGNOSIS — K5730 Diverticulosis of large intestine without perforation or abscess without bleeding: Secondary | ICD-10-CM | POA: Insufficient documentation

## 2020-10-21 DIAGNOSIS — Z86010 Personal history of colonic polyps: Secondary | ICD-10-CM

## 2020-10-21 DIAGNOSIS — Z8249 Family history of ischemic heart disease and other diseases of the circulatory system: Secondary | ICD-10-CM | POA: Insufficient documentation

## 2020-10-21 DIAGNOSIS — K3189 Other diseases of stomach and duodenum: Secondary | ICD-10-CM | POA: Insufficient documentation

## 2020-10-21 DIAGNOSIS — I4819 Other persistent atrial fibrillation: Secondary | ICD-10-CM | POA: Diagnosis not present

## 2020-10-21 HISTORY — PX: ESOPHAGOGASTRODUODENOSCOPY (EGD) WITH PROPOFOL: SHX5813

## 2020-10-21 HISTORY — PX: COLONOSCOPY WITH PROPOFOL: SHX5780

## 2020-10-21 HISTORY — PX: UPPER ESOPHAGEAL ENDOSCOPIC ULTRASOUND (EUS): SHX6562

## 2020-10-21 SURGERY — COLONOSCOPY WITH PROPOFOL
Anesthesia: Monitor Anesthesia Care

## 2020-10-21 MED ORDER — SODIUM CHLORIDE 0.9 % IV SOLN: INTRAVENOUS | Status: DC

## 2020-10-21 MED ORDER — LACTATED RINGERS IV SOLN: INTRAVENOUS | Status: DC

## 2020-10-21 MED ORDER — PROPOFOL 500 MG/50ML IV EMUL
INTRAVENOUS | Status: DC | PRN
  Administered 2020-10-21: 200 ug/kg/min via INTRAVENOUS
  Administered 2020-10-21 (×2): 150 ug/kg/min via INTRAVENOUS

## 2020-10-21 MED ORDER — PROPOFOL 500 MG/50ML IV EMUL
INTRAVENOUS | Status: AC
  Filled 2020-10-21: qty 50

## 2020-10-21 MED ORDER — LIDOCAINE 2% (20 MG/ML) 5 ML SYRINGE
INTRAMUSCULAR | Status: DC | PRN
  Administered 2020-10-21: 40 mg via INTRAVENOUS

## 2020-10-21 MED ORDER — PROPOFOL 10 MG/ML IV BOLUS
INTRAVENOUS | Status: DC | PRN
  Administered 2020-10-21: 30 mg via INTRAVENOUS

## 2020-10-21 SURGICAL SUPPLY — 22 items

## 2020-10-21 NOTE — Anesthesia Postprocedure Evaluation (Signed)
Anesthesia Post Note  Patient: Leonard Williams  Procedure(s) Performed: COLONOSCOPY WITH PROPOFOL (N/A ) UPPER ESOPHAGEAL ENDOSCOPIC ULTRASOUND (EUS) (N/A ) ESOPHAGOGASTRODUODENOSCOPY (EGD) WITH PROPOFOL (N/A )     Patient location during evaluation: Endoscopy Anesthesia Type: MAC Level of consciousness: awake Pain management: pain level controlled Vital Signs Assessment: post-procedure vital signs reviewed and stable Respiratory status: spontaneous breathing Cardiovascular status: stable Postop Assessment: no apparent nausea or vomiting Anesthetic complications: no   No complications documented.  Last Vitals:  Vitals:   10/21/20 0840 10/21/20 0850  BP: 102/61 118/60  Pulse: (!) 52 (!) 51  Resp: 15 11  Temp:    SpO2: 98% 98%    Last Pain:  Vitals:   10/21/20 0827  TempSrc: Axillary  PainSc: [place]

## 2020-10-21 NOTE — H&P (Signed)
Review of pertinent gastrointestinal problems: 1. history of small precancerous colon polyp.  Colonoscopy Dr. Wynetta Williams 01/2015: indication "83mm SSA removed in 2013;" findings no polyps.  2. Gastric subepithelial lesion, referred by Dr. Wynetta Williams 2016: EUS 2016 11.67mm lesion, presumed to be a very small GIST; EUS 2017 no signficant change, 11.48mm.  EUS August 2019 lesion measured 11.2 mm.   HPI: This is a man with the above issues.    ROS: complete GI ROS as described in HPI, all other review negative.  Constitutional:  No unintentional weight loss   Past Medical History:  Diagnosis Date  . Arthritis    mild arthritis- neck shoulders, more right shoulder-no problems at present.  . Atrial fibrillation, persistent ([place])   . Cancer [facility])    GI tumor- stomach tumor "nonmalignant" last check 1 yr ago.  . Glaucoma   . Headache disorder 03/08/2015   Topamax taken daily-very low grade headache to none now.  . History of colonic diverticulitis    left  . History of hiatal hernia    omeprazole as needed- "mild"  . 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 CARDIOVERSION N/A 04/10/2017   Procedure: CARDIOVERSION;  Surgeon: Lelon Perla, MD;  Location: [facility] ENDOSCOPY;  Service: Cardiovascular;  Laterality: N/A;  . COLONOSCOPY N/A 01/21/2015   Procedure: COLONOSCOPY;  Surgeon: Garlan Fair, MD;  Location: [facility] ENDOSCOPY;  Service: Endoscopy;  Laterality: N/A;-polyp removed in past-benign.  . ESOPHAGOGASTRODUODENOSCOPY (EGD) WITH PROPOFOL N/A 01/21/2015   Procedure: ESOPHAGOGASTRODUODENOSCOPY (EGD) WITH PROPOFOL;  Surgeon: Garlan Fair, MD;  Location: [facility] ENDOSCOPY;  Service: Endoscopy;  Laterality: N/A;  . EUS N/A 02/04/2015   Procedure: UPPER ENDOSCOPIC ULTRASOUND (EUS) LINEAR;  Surgeon: Milus Banister, MD;  Location: [facility] ENDOSCOPY;  Service: Endoscopy;  Laterality: N/A;  . EUS N/A 02/03/2016   Procedure: UPPER ENDOSCOPIC ULTRASOUND (EUS) RADIAL;   Surgeon: Milus Banister, MD;  Location: [facility] ENDOSCOPY;  Service: Endoscopy;  Laterality: N/A;  . EUS N/A 02/07/2018   Procedure: UPPER ENDOSCOPIC ULTRASOUND (EUS) RADIAL;  Surgeon: Milus Banister, MD;  Location: [facility] ENDOSCOPY;  Service: Endoscopy;  Laterality: N/A;  . EYE SURGERY     laser surgery for glaucoma  . HERNIA REPAIR Left    groin  . TONSILLECTOMY      Current Facility-Administered Medications  Medication Dose Route Frequency Provider Last Rate Last Admin  . lactated ringers infusion   Intravenous Continuous Milus Banister, MD        Allergies as of 09/01/2020  . (No Known Allergies)    Family History  Problem Relation Age of Onset  . Glaucoma Mother   . Dementia Father   . Graves' disease Sister   . Heart disease Sister   . Migraines Sister   . Heart attack Brother   . CAD Brother     Social History   Socioeconomic History  . Marital status: Married    Spouse name: Not on file  . Number of children: 3  . Years of education: BA  . Highest education level: Not on file  Occupational History  . Occupation: retired  Tobacco Use  . Smoking status: Never Smoker  . Smokeless tobacco: Never Used  Vaping Use  . Vaping Use: Never used  Substance and Sexual Activity  . Alcohol use: Yes    Alcohol/week: 2.0 - 4.0 standard drinks    Types: 1 - 2 Cans of beer, 1 - 2 Standard  drinks or equivalent per week    Comment: social - 1-2 beer per  week  . Drug use: No  . Sexual activity: Yes    Birth control/protection: None  Other Topics Concern  . Not on file  Social History Narrative   Patient drinks 2 cups of caffeine daily.   Patient is right handed.   Social Determinants of Health   Financial Resource Strain: Not on file  Food Insecurity: Not on file  Transportation Needs: Not on file  Physical Activity: Not on file  Stress: Not on file  Social Connections: Not on file  Intimate Partner Violence: Not on file     Physical Exam: Temp 98.5 F (36.9 C)  (Oral)   Resp 14   Ht 6\' 2"  (1.88 m)   Wt 93.4 kg   SpO2 98%   BMI 26.45 kg/m  Constitutional: generally well-appearing Psychiatric: alert and oriented x3 Abdomen: soft, nontender, nondistended, no obvious ascites, no peritoneal signs, normal bowel sounds No peripheral edema noted in lower extremities  Assessment and plan: 75 y.o. male with with gastric nodule, h/o colon polyps   For colonoscoyp and EUS today  Please see the "Patient Instructions" section for addition details about the plan.  Leonard Loffler, MD [facility] 10/21/2020, 7:04 AM

## 2020-10-21 NOTE — Discharge Instructions (Signed)
YOU HAD AN ENDOSCOPIC PROCEDURE TODAY: Refer to the procedure report and other information in the discharge instructions given to you for any specific questions about what was found during the examination. If this information does not answer your questions, please call [place] office at [phone] to clarify.  ° °YOU SHOULD EXPECT: Some feelings of bloating in the abdomen. Passage of more gas than usual. Walking can help get rid of the air that was put into your GI tract during the procedure and reduce the bloating. If you had a lower endoscopy (such as a colonoscopy or flexible sigmoidoscopy) you may notice spotting of blood in your stool or on the toilet paper. Some abdominal soreness may be present for a day or two, also. ° °DIET: Your first meal following the procedure should be a light meal and then it is ok to progress to your normal diet. A half-sandwich or bowl of soup is an example of a good first meal. Heavy or fried foods are harder to digest and may make you feel nauseous or bloated. Drink plenty of fluids but you should avoid alcoholic beverages for 24 hours. If you had a esophageal dilation, please see attached instructions for diet.   ° °ACTIVITY: Your care partner should take you home directly after the procedure. You should plan to take it easy, moving slowly for the rest of the day. You can resume normal activity the day after the procedure however YOU SHOULD NOT DRIVE, use power tools, machinery or perform tasks that involve climbing or major physical exertion for 24 hours (because of the sedation medicines used during the test).  ° °SYMPTOMS TO REPORT IMMEDIATELY: °A gastroenterologist can be reached at any hour. Please call [phone]  for any of the following symptoms:  °Following lower endoscopy (colonoscopy, flexible sigmoidoscopy) °Excessive amounts of blood in the stool  °Significant tenderness, worsening of abdominal pains  °Swelling of the abdomen that is new, acute  °Fever of 100° or  higher  °Following upper endoscopy (EGD, EUS, ERCP, esophageal dilation) °Vomiting of blood or coffee ground material  °New, significant abdominal pain  °New, significant chest pain or pain under the shoulder blades  °Painful or persistently difficult swallowing  °New shortness of breath  °Black, tarry-looking or red, bloody stools ° °FOLLOW UP:  °If any biopsies were taken you will be contacted by phone or by letter within the next 1-3 weeks. Call [phone]  if you have not heard about the biopsies in 3 weeks.  °Please also call with any specific questions about appointments or follow up tests. ° °

## 2020-10-21 NOTE — Transfer of Care (Signed)
Immediate Anesthesia Transfer of Care Note  Patient: Leonard Williams  Procedure(s) Performed: COLONOSCOPY WITH PROPOFOL (N/A ) UPPER ESOPHAGEAL ENDOSCOPIC ULTRASOUND (EUS) (N/A ) ESOPHAGOGASTRODUODENOSCOPY (EGD) WITH PROPOFOL (N/A )  Patient Location: Endoscopy Unit  Anesthesia Type:MAC  Level of Consciousness: awake, alert , oriented and patient cooperative  Airway & Oxygen Therapy: Patient Spontanous Breathing and Patient connected to face mask oxygen  Post-op Assessment: Report given to RN, Post -op Vital signs reviewed and stable and Patient moving all extremities  Post vital signs: Reviewed and stable  Last Vitals:  Vitals Value Taken Time  BP 102/61 10/21/20 0840  Temp 36.5 C 10/21/20 0827  Pulse 52 10/21/20 0840  Resp 15 10/21/20 0840  SpO2 98 % 10/21/20 0840    Last Pain:  Vitals:   10/21/20 0827  TempSrc: Axillary  PainSc: Asleep         Complications: No complications documented.

## 2020-10-21 NOTE — Op Note (Signed)
[facility] Patient Name: Leonard Williams Procedure Date: 10/21/2020 MRN: [id number] Attending MD: Milus Banister , MD Date of Birth: [birth year] CSN: [id number] Age: 75 Admit Type: Outpatient Procedure:                Colonoscopy Indications:              High risk colon cancer surveillance: Personal                            history of colonic polyps; Colonoscopy Dr. Wynetta Emery                            01/2015: indication "12mm SSA removed in 2013;"                            findings no polyps Providers:                Milus Banister, MD, Mikey College, RN, Tyna Jaksch Technician Referring MD:              Medicines:                Monitored Anesthesia Care Complications:            No immediate complications. Estimated blood loss:                            None. Estimated Blood Loss:     Estimated blood loss: none. Procedure:                Pre-Anesthesia Assessment:                           - Prior to the procedure, a History and Physical                            was performed, and patient medications and                            allergies were reviewed. The patient's tolerance of                            previous anesthesia was also reviewed. The risks                            and benefits of the procedure and the sedation                            options and risks were discussed with the patient.                            All questions were answered, and informed consent                            was obtained. Prior Anticoagulants: The  patient has                            taken Xarelto (rivaroxaban), last dose was 3 days                            prior to procedure. ASA Grade Assessment: III - A                            patient with severe systemic disease. After                            reviewing the risks and benefits, the patient was                            deemed in satisfactory condition to undergo the                             procedure.                           After obtaining informed consent, the colonoscope                            was passed under direct vision. Throughout the                            procedure, the patient's blood pressure, pulse, and                            oxygen saturations were monitored continuously. The                            [id number] ([id number] Olympus colonoscope was                            introduced through the anus and advanced to the the                            cecum, identified by appendiceal orifice and                            ileocecal valve. The colonoscopy was performed                            without difficulty. The patient tolerated the                            procedure well. The quality of the bowel                            preparation was good. The ileocecal valve,                            appendiceal orifice,  and rectum were photographed. Scope In: 7:39:17 AM Scope Out: 7:57:49 AM Scope Withdrawal Time: 0 hours 7 minutes 50 seconds  Total Procedure Duration: 0 hours 18 minutes 32 seconds  Findings:      Multiple small and large-mouthed diverticula were found in the left       colon.      The exam was otherwise without abnormality on direct and retroflexion       views. Impression:               - Diverticulosis in the left colon.                           - The examination was otherwise normal on direct                            and retroflexion views.                           - No polyps or cancers. Moderate Sedation:      Not Applicable - Patient had care per Anesthesia. Recommendation:           - Upper EUS now.                           - You do not need any further colon cancer                            screening tests (including stool testing). These                            types of tests generally stop around age 58-80. Procedure Code(s):        --- Professional ---                            [phone], Colonoscopy, flexible; diagnostic, including                            collection of specimen(s) by brushing or washing,                            when performed (separate procedure) Diagnosis Code(s):        --- Professional ---                           Z86.010, Personal history of colonic polyps                           K57.30, Diverticulosis of large intestine without                            perforation or abscess without bleeding CPT copyright 2019 American [employer] Association. All rights reserved. The codes documented in this report are preliminary and upon coder review may  be revised to meet current compliance requirements. Milus Banister, MD 10/21/2020 8:01:05 AM This report has been signed electronically. Number of Addenda: 0

## 2020-10-21 NOTE — Op Note (Signed)
[facility] Patient Name: Leonard Williams Procedure Date: 10/21/2020 MRN: [id number] Attending MD: Milus Banister , MD Date of Birth: 29-May-1946 CSN: [id number] Age: 75 Admit Type: Outpatient Procedure:                Upper EUS Indications:              Gastric subepithelial lesion, referred by Dr.                            Wynetta Emery 2016: EUS 2016 11.80mm lesion, presumed to                            be a very small GIST; EUS 2017 no signficant                            change, 11.61mm. EUS August 2019 lesion measured                            11.2 mm. Providers:                Milus Banister, MD, Mikey College, RN, Tyna Jaksch Technician Referring MD:              Medicines:                Monitored Anesthesia Care Complications:            No immediate complications. Estimated blood loss:                            None. Estimated Blood Loss:     Estimated blood loss: none. Procedure:                Pre-Anesthesia Assessment:                           - Prior to the procedure, a History and Physical                            was performed, and patient medications and                            allergies were reviewed. The patient's tolerance of                            previous anesthesia was also reviewed. The risks                            and benefits of the procedure and the sedation                            options and risks were discussed with the patient.                            All questions  were answered, and informed consent                            was obtained. Prior Anticoagulants: The patient has                            taken Xarelto (rivaroxaban), last dose was 3 days                            prior to procedure. ASA Grade Assessment: III - A                            patient with severe systemic disease. After                            reviewing the risks and benefits, the patient was                             deemed in satisfactory condition to undergo the                            procedure.                           After obtaining informed consent, the endoscope was                            passed under direct vision. Throughout the                            procedure, the patient's blood pressure, pulse, and                            oxygen saturations were monitored continuously. The                            [id number] ([id number] Olympus Radial EUS was                            introduced through the mouth, and advanced to the                            second part of duodenum. Scope In: Scope Out: Findings:      ENDOSCOPIC FINDING: :      The examined esophagus was endoscopically normal.      Small, endoscopically unchanged subepthelial leion on the proximal       gastric body. Measures about 1cm across.      The examined duodenum was endoscopically normal.      ENDOSONOGRAPHIC FINDING: :      1. The subepthelial gastric lesion described above is a round,       hypoechoic, heterogeneous lesion with discrete borders. It communicates       clearly with the muscularis propria layer of the gastric wall and       measures 11.14mm maximally.  2. No perigastric adenopathy.      3. Pancreatic parenchyma and main pancreatic duct were normal.      4. Limited views of the liver, spleen, portal and splenic vessels were       all normal. Impression:               - The previously noted gastric subepithelial lesion                            was again evaluated and is unchanged vs. initial                            imaging in 2016. No further surveillance is                            necessary.                           - Normal pancreas. Moderate Sedation:      Not Applicable - Patient had care per Anesthesia. Recommendation:           - Discharge patient to home. Procedure Code(s):        --- Professional ---                           [phone],  Esophagogastroduodenoscopy, flexible,                            transoral; with endoscopic ultrasound examination                            limited to the esophagus, stomach or duodenum, and                            adjacent structures Diagnosis Code(s):        --- Professional ---                           K31.89, Other diseases of stomach and duodenum                           K92.9, Disease of digestive system, unspecified CPT copyright 2019 American Medical Association. All rights reserved. The codes documented in this report are preliminary and upon coder review may  be revised to meet current compliance requirements. Milus Banister, MD 10/21/2020 8:32:25 AM This report has been signed electronically. Number of Addenda: 0

## 2020-10-22 ENCOUNTER — Encounter (HOSPITAL_COMMUNITY): Payer: Self-pay | Admitting: Gastroenterology

## 2021-01-04 DIAGNOSIS — H401111 Primary open-angle glaucoma, right eye, mild stage: Secondary | ICD-10-CM | POA: Diagnosis not present

## 2021-01-04 DIAGNOSIS — H04203 Unspecified epiphora, bilateral lacrimal glands: Secondary | ICD-10-CM | POA: Diagnosis not present

## 2021-01-04 DIAGNOSIS — H401123 Primary open-angle glaucoma, left eye, severe stage: Secondary | ICD-10-CM | POA: Diagnosis not present

## 2021-01-04 DIAGNOSIS — Z961 Presence of intraocular lens: Secondary | ICD-10-CM | POA: Diagnosis not present

## 2021-02-21 ENCOUNTER — Ambulatory Visit (HOSPITAL_COMMUNITY)
Admission: RE | Admit: 2021-02-21 | Discharge: 2021-02-21 | Disposition: A | Discharge status: Home / Self Care | Payer: Medicare Other | Source: Ambulatory Visit | Attending: Nurse Practitioner | Admitting: Nurse Practitioner

## 2021-02-21 ENCOUNTER — Other Ambulatory Visit: Payer: Self-pay

## 2021-02-21 ENCOUNTER — Encounter (HOSPITAL_COMMUNITY): Payer: Self-pay | Admitting: Nurse Practitioner

## 2021-02-21 VITALS — BP 138/88 | HR 53 | Ht 74.0 in | Wt 211.8 lb

## 2021-02-21 DIAGNOSIS — I4819 Other persistent atrial fibrillation: Secondary | ICD-10-CM | POA: Diagnosis not present

## 2021-02-21 DIAGNOSIS — Z8249 Family history of ischemic heart disease and other diseases of the circulatory system: Secondary | ICD-10-CM | POA: Insufficient documentation

## 2021-02-21 DIAGNOSIS — Z79899 Other long term (current) drug therapy: Secondary | ICD-10-CM | POA: Insufficient documentation

## 2021-02-21 DIAGNOSIS — D6869 Other thrombophilia: Secondary | ICD-10-CM | POA: Diagnosis not present

## 2021-02-21 LAB — CBC
HCT: 42.9 % (ref 39.0–52.0)
Hemoglobin: 14.6 g/dL (ref 13.0–17.0)
MCH: 30 pg (ref 26.0–34.0)
MCHC: 34 g/dL (ref 30.0–36.0)
MCV: 88.1 fL (ref 80.0–100.0)
Platelets: 210 10*3/uL (ref 150–400)
RBC: 4.87 MIL/uL (ref 4.22–5.81)
RDW: 13.2 % (ref 11.5–15.5)
WBC: 4.3 10*3/uL (ref 4.0–10.5)
nRBC: 0 % (ref 0.0–0.2)

## 2021-02-21 LAB — BASIC METABOLIC PANEL
Anion gap: 7 (ref 5–15)
BUN: 13 mg/dL (ref 8–23)
CO2: 26 mmol/L (ref 22–32)
Calcium: 8.9 mg/dL (ref 8.9–10.3)
Chloride: 105 mmol/L (ref 98–111)
Creatinine, Ser: 0.99 mg/dL (ref 0.61–1.24)
GFR, Estimated: >60 mL/min (ref >60)
Glucose, Bld: 122 mg/dL — ABNORMAL HIGH (ref 70–99)
Potassium: 4.7 mmol/L (ref 3.5–5.1)
Sodium: 138 mmol/L (ref 135–145)

## 2021-02-21 LAB — MAGNESIUM: Magnesium: 2.1 mg/dL (ref 1.7–2.4)

## 2021-02-21 NOTE — Progress Notes (Signed)
Primary Care Physician: [name], MD Referring Physician: Dr. 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 male with a h/o  afib that was found at the time of a wellness exam in August 2018 with rate bing controlled and pt fairly asymptomatic. Duration of afib unknown but thought to be persistent for months, possibly up to one year as he had not seen a MD since last yearly physical. He was seen by Cardiology who set pt up for cardioversion which was done after loading of Doac x 3 weeks with a chadsvasc score of 2(age, Recently dx pre didabetic). He did shock out but returned to afib after a few days. Pt recently lost 30 lbs over the last few months with dx of pre diabetes. Marland KitchenHe is pending a sleep study. No significant caffeine or alcohol use, no tobacco use. He is in the afib clinic to discuss options to return to [place].  He had a ETT 03/2016, which was low risk, echo showed normal heart function. Baseline EKG in afib shows LAFB as well as an EKG from 2017 in SR .He runs low 60's in SR without any AV blocking agents.  F/u in afib clinic, 10/15. I saw 10/10 and discussed options to restore SR, and pt wanted to thinka about options. He is in clinic today to come into hosptial for [place] admission.  F/u in afib clinic, 11/14, he initially was seen 10/25 after Tikosyn admit and was in [place]. He asked to be seen in the afib clinic today for irregularity in his heart rate. Ekg shows afib with 120 bpm. Later converted  on his own.  F/u in afib clinic for one month f/u after tikosyn. He is in SR today. BP stable.  F/u in afib clinic, 4/19. He remains in SR and feels well. Continue on dofetilide and xarelto, no issues.  F/u in afib clinic for surveillance of Tikosyn. He remains in SR, no afib noted and  feels well. He will be having an endoscopy in August and has been ok'ed per M. Supple, PharmD that he can stop xarelto 1-2 days before procedure.  F/u 10/14. He feels well. He has not noted any  afib on tikosyn. No issues with eliquis.  F/u in afib clinic 2/14. He continues on Tikosyn and is doing well, no afib reported. No issues with xarelto. qtc stable.  F/u in afib clinic 08/22/19 for Tikosyn surveillance. He does not report any afib. He and his wife both had mild covid cases after Christmas and since have received both of the vaccines. No issues with bleeding, continues on xarelto with CHA2DS2VASc score of 2.   In for tikosyn surveillance 8/11. He has not noted any breakthrough afib. He feels well. He has had both covid shots. No bleeding issues. Continues on xarelto for a CHA2DS2VASc score of 2.   Pt asked to be seen today, 10/28, for irregular pulse he has noted over the last few days and was concerned he was in afib. His ekg today is normal with a stable qt on Tikosyn. By his description, it sounds like premature contractions. He  had covid booster 3 weeks ago but more recently had his last of the 3  hepatitis vaccines. He has also had a recent case of diverticulitis  treated by PCP. He has not other complaints today.   F/u in afib clinic, 08/19/20, f/u visit for tikosyn surveillance. Qtc is stable. No afib noted. He has been well and feels good.  No issues with anticoagulation.   F/u in afib clinic, 02/21/21,  for surveillance of  tikosyn. He reports no afib. He feels well. No issues with anticoagulation.   Today, he denies symptoms of palpitations, chest pain, shortness of breath, orthopnea, PND, lower extremity edema, dizziness, presyncope, syncope, or neurologic sequela. The patient is tolerating medications without difficulties and is otherwise without complaint today.   Past Medical History:  Diagnosis Date   Arthritis    mild arthritis- neck shoulders, more right shoulder-no problems at present.   Atrial fibrillation, persistent (HCC)    Cancer (HCC)    GI tumor- stomach tumor "nonmalignant" last check 1 yr ago.   Glaucoma    Headache disorder 03/08/2015   Topamax taken  daily-very low grade headache to none now.   History of colonic diverticulitis    left   History of hiatal hernia    omeprazole as needed- "mild"   [name] pupil [facility])    Past Surgical History:  Procedure Laterality Date   CARDIOVERSION N/A 04/10/2017   Procedure: CARDIOVERSION;  Surgeon: Lelon Perla, MD;  Location: [facility] ENDOSCOPY;  Service: Cardiovascular;  Laterality: N/A;   COLONOSCOPY N/A 01/21/2015   Procedure: COLONOSCOPY;  Surgeon: Garlan Fair, MD;  Location: [facility] ENDOSCOPY;  Service: Endoscopy;  Laterality: N/A;-polyp removed in past-benign.   COLONOSCOPY WITH PROPOFOL N/A 10/21/2020   Procedure: COLONOSCOPY WITH PROPOFOL;  Surgeon: Milus Banister, MD;  Location: [facility] ENDOSCOPY;  Service: Endoscopy;  Laterality: N/A;   ESOPHAGOGASTRODUODENOSCOPY (EGD) WITH PROPOFOL N/A 01/21/2015   Procedure: ESOPHAGOGASTRODUODENOSCOPY (EGD) WITH PROPOFOL;  Surgeon: Garlan Fair, MD;  Location: [facility] ENDOSCOPY;  Service: Endoscopy;  Laterality: N/A;   ESOPHAGOGASTRODUODENOSCOPY (EGD) WITH PROPOFOL N/A 10/21/2020   Procedure: ESOPHAGOGASTRODUODENOSCOPY (EGD) WITH PROPOFOL;  Surgeon: Milus Banister, MD;  Location: [facility] ENDOSCOPY;  Service: Endoscopy;  Laterality: N/A;   EUS N/A 02/04/2015   Procedure: UPPER ENDOSCOPIC ULTRASOUND (EUS) LINEAR;  Surgeon: Milus Banister, MD;  Location: [facility] ENDOSCOPY;  Service: Endoscopy;  Laterality: N/A;   EUS N/A 02/03/2016   Procedure: UPPER ENDOSCOPIC ULTRASOUND (EUS) RADIAL;  Surgeon: Milus Banister, MD;  Location: [facility] ENDOSCOPY;  Service: Endoscopy;  Laterality: N/A;   EUS N/A 02/07/2018   Procedure: UPPER ENDOSCOPIC ULTRASOUND (EUS) RADIAL;  Surgeon: Milus Banister, MD;  Location: [facility] ENDOSCOPY;  Service: Endoscopy;  Laterality: N/A;   EYE SURGERY     laser surgery for glaucoma   HERNIA REPAIR Left    groin   TONSILLECTOMY     UPPER ESOPHAGEAL ENDOSCOPIC ULTRASOUND (EUS) N/A 10/21/2020   Procedure: UPPER ESOPHAGEAL ENDOSCOPIC ULTRASOUND (EUS);  Surgeon:  Milus Banister, MD;  Location: [facility] ENDOSCOPY;  Service: Endoscopy;  Laterality: N/A;    Current Outpatient Medications  Medication Sig Dispense Refill   acetaminophen (TYLENOL) 500 MG tablet Take 500-1,000 mg by mouth every 6 (six) hours as needed (PAIN).     cholecalciferol (VITAMIN D) 1000 units tablet Take 1,000 Units by mouth every evening.     dofetilide (TIKOSYN) 500 MCG capsule Take 1 capsule by mouth twice daily 60 capsule 6   dorzolamide-timolol (COSOPT) 22.3-6.8 MG/ML ophthalmic solution Place 1 drop into both eyes 2 (two) times daily.      latanoprost (XALATAN) 0.005 % ophthalmic solution Place 1 drop into both eyes at bedtime.      Multiple Vitamin (MULTIVITAMIN WITH MINERALS) TABS tablet Take 1 tablet by mouth every evening. CENTRUM SILVER FOR MEN 50+     sildenafil (REVATIO) 20 MG tablet  Take 80-100 mg by mouth daily as needed (ERECTILE DYSFUNCTION).  8   XARELTO 20 MG TABS tablet TAKE 1 TABLET BY MOUTH ONCE DAILY WITH SUPPER 90 tablet 1   No current facility-administered medications for this encounter.    No Known Allergies  Social History   Socioeconomic History   Marital status: Married    Spouse name: Not on file   Number of children: 3   Years of education: BA   Highest education level: Not on file  Occupational History   Occupation: retired  Tobacco Use   Smoking status: Never   Smokeless tobacco: Never  Vaping Use   Vaping Use: Never used  Substance and Sexual Activity   Alcohol use: Yes    Alcohol/week: 2.0 - 4.0 standard drinks    Types: 1 - 2 Cans of beer, 1 - 2 Standard drinks or equivalent per week    Comment: social - 1-2 beer per  week   Drug use: No   Sexual activity: Yes    Birth control/protection: None  Other Topics Concern   Not on file  Social History Narrative   Patient drinks 2 cups of caffeine daily.   Patient is right handed.   Social Determinants of Health   Financial Resource Strain: Not on file  Food Insecurity: Not on file   Transportation Needs: Not on file  Physical Activity: Not on file  Stress: Not on file  Social Connections: Not on file  Intimate Partner Violence: Not on file    Family History  Problem Relation Age of Onset   Glaucoma Mother    Dementia Father    Berenice Primas' disease Sister    Heart disease Sister    Migraines Sister    Heart attack Brother    CAD Brother     ROS- All systems are reviewed and negative except as per the HPI above  Physical Exam: Vitals:   02/21/21 0937  BP: 138/88  Pulse: (!) 53  Weight: 96.1 kg  Height: '6\' 2"'$  (1.88 m)   Wt Readings from Last 3 Encounters:  02/21/21 96.1 kg  10/21/20 93.4 kg  08/19/20 96.8 kg    Labs: Lab Results  Component Value Date   NA 142 08/19/2020   K 4.7 08/19/2020   CL 103 08/19/2020   CO2 29 08/19/2020   GLUCOSE 95 08/19/2020   BUN 16 08/19/2020   CREATININE 1.17 08/19/2020   CALCIUM 9.1 08/19/2020   MG 2.2 08/19/2020   No results found for: INR No results found for: CHOL, HDL, LDLCALC, TRIG   GEN- The patient is well appearing, alert and oriented x 3 today.   Head- normocephalic, atraumatic Eyes-  Sclera clear, conjunctiva pink Ears- hearing intact Oropharynx- clear Neck- supple, no JVP Lymph- no cervical lymphadenopathy Lungs- Clear to ausculation bilaterally, normal work of breathing Heart-  regular rate and rhythm, no murmurs, rubs or gallops, PMI not laterally displaced GI- soft, NT, ND, + BS Extremities- no clubbing, cyanosis, or edema MS- no significant deformity or atrophy Skin- no rash or lesion Psych- euthymic mood, full affect Neuro- strength and sensation are intact  EKG-  sinus brady at 53 bpm, pr int 172 bpm, qrs int 84 ms, qtc 409 ms    Echo-Study Conclusions   - Left ventricle: The cavity size was normal. Wall thickness was   normal. Systolic function was normal. The estimated ejection   fraction was in the range of 50% to 55%. Wall motion was normal;   there  were no regional wall  motion abnormalities. Left   ventricular diastolic function parameters were normal. - Left atrium: The atrium was mildly dilated. Volume/bsa, ES,   (1-plane Simpson&'s, A2C): 43.4 ml/m^2. - Right atrium: The atrium was mildly dilated.   Impressions:   - Compared to the prior study, there has been no significant   interval change.   1.Assessment and Plan: Persistent  Afib Maintaining  SR Has not noted any afib  Continue  Dofetilide at 500 mcg bid, qt stable  Has 30  mg cardizem if needed, for afib , but no episodes reported Continue xarelto with a chadsvasc score of 2  Bmet/mag today   F/u with afib clinic in 6 months   for Tikosyn surveillance  Leonard Williams, [facility] [address] [place], [place] [address] [phone]

## 2021-03-17 DIAGNOSIS — L821 Other seborrheic keratosis: Secondary | ICD-10-CM | POA: Diagnosis not present

## 2021-03-17 DIAGNOSIS — D225 Melanocytic nevi of trunk: Secondary | ICD-10-CM | POA: Diagnosis not present

## 2021-03-17 DIAGNOSIS — L570 Actinic keratosis: Secondary | ICD-10-CM | POA: Diagnosis not present

## 2021-03-17 DIAGNOSIS — D1801 Hemangioma of skin and subcutaneous tissue: Secondary | ICD-10-CM | POA: Diagnosis not present

## 2021-03-17 DIAGNOSIS — L812 Freckles: Secondary | ICD-10-CM | POA: Diagnosis not present

## 2021-03-23 ENCOUNTER — Other Ambulatory Visit: Payer: Self-pay | Admitting: Nurse Practitioner

## 2021-05-06 DIAGNOSIS — S01312A Laceration without foreign body of left ear, initial encounter: Secondary | ICD-10-CM | POA: Insufficient documentation

## 2021-05-17 DIAGNOSIS — H401123 Primary open-angle glaucoma, left eye, severe stage: Secondary | ICD-10-CM | POA: Diagnosis not present

## 2021-05-17 DIAGNOSIS — H04203 Unspecified epiphora, bilateral lacrimal glands: Secondary | ICD-10-CM | POA: Diagnosis not present

## 2021-05-17 DIAGNOSIS — H401111 Primary open-angle glaucoma, right eye, mild stage: Secondary | ICD-10-CM | POA: Diagnosis not present

## 2021-05-17 DIAGNOSIS — Z961 Presence of intraocular lens: Secondary | ICD-10-CM | POA: Diagnosis not present

## 2021-05-18 ENCOUNTER — Other Ambulatory Visit (HOSPITAL_COMMUNITY): Payer: Self-pay | Admitting: Nurse Practitioner

## 2021-06-14 DIAGNOSIS — Z125 Encounter for screening for malignant neoplasm of prostate: Secondary | ICD-10-CM | POA: Diagnosis not present

## 2021-06-14 DIAGNOSIS — G43009 Migraine without aura, not intractable, without status migrainosus: Secondary | ICD-10-CM | POA: Diagnosis not present

## 2021-06-14 DIAGNOSIS — Z0000 Encounter for general adult medical examination without abnormal findings: Secondary | ICD-10-CM | POA: Diagnosis not present

## 2021-06-14 DIAGNOSIS — I4891 Unspecified atrial fibrillation: Secondary | ICD-10-CM | POA: Diagnosis not present

## 2021-06-14 DIAGNOSIS — E559 Vitamin D deficiency, unspecified: Secondary | ICD-10-CM | POA: Diagnosis not present

## 2021-06-14 DIAGNOSIS — H409 Unspecified glaucoma: Secondary | ICD-10-CM | POA: Diagnosis not present

## 2021-06-14 DIAGNOSIS — Z1389 Encounter for screening for other disorder: Secondary | ICD-10-CM | POA: Diagnosis not present

## 2021-06-14 DIAGNOSIS — N183 Chronic kidney disease, stage 3 unspecified: Secondary | ICD-10-CM | POA: Diagnosis not present

## 2021-06-14 DIAGNOSIS — R7303 Prediabetes: Secondary | ICD-10-CM | POA: Diagnosis not present

## 2021-06-14 DIAGNOSIS — N1831 Chronic kidney disease, stage 3a: Secondary | ICD-10-CM | POA: Diagnosis not present

## 2021-06-24 DIAGNOSIS — S01312A Laceration without foreign body of left ear, initial encounter: Secondary | ICD-10-CM | POA: Diagnosis not present

## 2021-06-24 DIAGNOSIS — H903 Sensorineural hearing loss, bilateral: Secondary | ICD-10-CM | POA: Insufficient documentation

## 2021-06-24 DIAGNOSIS — H9313 Tinnitus, bilateral: Secondary | ICD-10-CM | POA: Insufficient documentation

## 2021-08-25 ENCOUNTER — Ambulatory Visit (HOSPITAL_COMMUNITY): Payer: Medicare Other | Admitting: Nurse Practitioner

## 2021-08-30 ENCOUNTER — Ambulatory Visit (HOSPITAL_COMMUNITY): Payer: Medicare Other | Admitting: Nurse Practitioner

## 2021-09-01 ENCOUNTER — Ambulatory Visit (HOSPITAL_COMMUNITY): Payer: Medicare Other | Admitting: Nurse Practitioner

## 2021-09-06 ENCOUNTER — Other Ambulatory Visit: Payer: Self-pay

## 2021-09-06 ENCOUNTER — Encounter (HOSPITAL_COMMUNITY): Payer: Self-pay | Admitting: Nurse Practitioner

## 2021-09-06 ENCOUNTER — Ambulatory Visit (HOSPITAL_COMMUNITY)
Admission: RE | Admit: 2021-09-06 | Discharge: 2021-09-06 | Disposition: A | Discharge status: Home / Self Care | Payer: Medicare Other | Source: Ambulatory Visit | Attending: Nurse Practitioner | Admitting: Nurse Practitioner

## 2021-09-06 VITALS — BP 146/74 | HR 59 | Ht 74.0 in | Wt 211.6 lb

## 2021-09-06 DIAGNOSIS — Z7901 Long term (current) use of anticoagulants: Secondary | ICD-10-CM | POA: Insufficient documentation

## 2021-09-06 DIAGNOSIS — I4819 Other persistent atrial fibrillation: Secondary | ICD-10-CM | POA: Diagnosis not present

## 2021-09-06 DIAGNOSIS — D6869 Other thrombophilia: Secondary | ICD-10-CM

## 2021-09-06 DIAGNOSIS — R7303 Prediabetes: Secondary | ICD-10-CM | POA: Insufficient documentation

## 2021-09-06 LAB — BASIC METABOLIC PANEL
Anion gap: 9 (ref 5–15)
BUN: 14 mg/dL (ref 8–23)
CO2: 28 mmol/L (ref 22–32)
Calcium: 9 mg/dL (ref 8.9–10.3)
Chloride: 102 mmol/L (ref 98–111)
Creatinine, Ser: 1 mg/dL (ref 0.61–1.24)
GFR, Estimated: >60 mL/min (ref >60)
Glucose, Bld: 98 mg/dL (ref 70–99)
Potassium: 4.2 mmol/L (ref 3.5–5.1)
Sodium: 139 mmol/L (ref 135–145)

## 2021-09-06 LAB — MAGNESIUM: Magnesium: 2.2 mg/dL (ref 1.7–2.4)

## 2021-09-06 NOTE — Progress Notes (Signed)
Primary Care Physician: [name], MD Referring Physician: Dr. 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 Duration of afib unknown but thought to be persistent for months, possibly up to one year as he had not seen a MD since last yearly physical. He was seen by Cardiology who set pt up for cardioversion which was done after loading of Doac x 3 weeks with a chadsvasc score of 2(age, Recently dx pre didabetic). He did shock out but returned to afib after a few days. Pt recently lost 30 lbs over the last few months with dx of pre diabetes. Marland KitchenHe is pending a sleep study. No significant caffeine or alcohol use, no tobacco use. He is in the afib clinic to discuss options to return to [place].  He had a ETT 03/2016, which was low risk, echo showed normal heart function. Baseline EKG in afib shows LAFB as well as an EKG from 2017 in SR .He runs low 60's in SR without any AV blocking agents.  F/u in afib clinic, 10/15. I saw 10/10 and discussed options to restore SR, and pt wanted to thinka about options. He is in clinic today to come into hosptial for [place] admission.  F/u in afib clinic, 11/14, he initially was seen 10/25 after Tikosyn admit and was in [place]. He asked to be seen in the afib clinic today for irregularity in his heart rate. Ekg shows afib with 120 bpm. Later converted  on his own.  F/u in afib clinic for one month f/u after tikosyn. He is in SR today. BP stable.  F/u in afib clinic, 4/19. He remains in SR and feels well. Continue on dofetilide and xarelto, no issues.  F/u in afib clinic for surveillance of Tikosyn. He remains in SR, no afib noted and  feels well. He will be having an endoscopy in August and has been ok'ed per M. Supple, PharmD that he can stop xarelto 1-2 days before procedure.  F/u 10/14. He feels well. He has not noted any  afib on tikosyn. No issues with eliquis.  F/u in afib clinic 2/14. He continues on Tikosyn and is doing well, no afib reported. No issues with xarelto. qtc stable.  F/u in afib clinic 08/22/19 for Tikosyn surveillance. He does not report any afib. He and his wife both had mild covid cases after Christmas and since have received both of the vaccines. No issues with bleeding, continues on xarelto with CHA2DS2VASc score of 2.   In for tikosyn surveillance 8/11. He has not noted any breakthrough afib. He feels well. He has had both covid shots. No bleeding issues. Continues on xarelto for a CHA2DS2VASc score of 2.   Pt asked to be seen today, 10/28, for irregular pulse he has noted over the last few days and was concerned he was in afib. His ekg today is normal with a stable qt on Tikosyn. By his description, it sounds like premature contractions. He  had covid booster 3 weeks ago but more recently had his last of the 3  hepatitis vaccines. He has also had a recent case of diverticulitis  treated by PCP. He has not other complaints today.   F/u in afib clinic, 08/19/20, f/u visit for tikosyn surveillance. Qtc is stable. No afib noted. He has been well and feels good.  No issues with anticoagulation.   F/u in afib clinic, 02/21/21,  for surveillance of  tikosyn. He reports no afib. He feels well. No issues with anticoagulation.   F/u in afib clinic, 09/06/21 for Tikosyn surveillance. He reports that he is doing well staying in [place]. Also reports compliance with Tikosyn. No issues with anticoagulation. Qt stable.   Today, he denies symptoms of palpitations, chest pain, shortness of breath, orthopnea, PND, lower extremity edema, dizziness, presyncope, syncope, or neurologic sequela. The patient is tolerating medications without difficulties and is otherwise without complaint today.   Past Medical History:  Diagnosis Date   Arthritis    mild arthritis- neck shoulders, more right shoulder-no problems at  present.   Atrial fibrillation, persistent (HCC)    Cancer (HCC)    GI tumor- stomach tumor "nonmalignant" last check 1 yr ago.   Glaucoma    Headache disorder 03/08/2015   Topamax taken daily-very low grade headache to none now.   History of colonic diverticulitis    left   History of hiatal hernia    omeprazole as needed- "mild"   [name] pupil [facility])    Past Surgical History:  Procedure Laterality Date   CARDIOVERSION N/A 04/10/2017   Procedure: CARDIOVERSION;  Surgeon: Lelon Perla, MD;  Location: [facility] ENDOSCOPY;  Service: Cardiovascular;  Laterality: N/A;   COLONOSCOPY N/A 01/21/2015   Procedure: COLONOSCOPY;  Surgeon: Garlan Fair, MD;  Location: [facility] ENDOSCOPY;  Service: Endoscopy;  Laterality: N/A;-polyp removed in past-benign.   COLONOSCOPY WITH PROPOFOL N/A 10/21/2020   Procedure: COLONOSCOPY WITH PROPOFOL;  Surgeon: Milus Banister, MD;  Location: [facility] ENDOSCOPY;  Service: Endoscopy;  Laterality: N/A;   ESOPHAGOGASTRODUODENOSCOPY (EGD) WITH PROPOFOL N/A 01/21/2015   Procedure: ESOPHAGOGASTRODUODENOSCOPY (EGD) WITH PROPOFOL;  Surgeon: Garlan Fair, MD;  Location: [facility] ENDOSCOPY;  Service: Endoscopy;  Laterality: N/A;   ESOPHAGOGASTRODUODENOSCOPY (EGD) WITH PROPOFOL N/A 10/21/2020   Procedure: ESOPHAGOGASTRODUODENOSCOPY (EGD) WITH PROPOFOL;  Surgeon: Milus Banister, MD;  Location: [facility] ENDOSCOPY;  Service: Endoscopy;  Laterality: N/A;   EUS N/A 02/04/2015   Procedure: UPPER ENDOSCOPIC ULTRASOUND (EUS) LINEAR;  Surgeon: Milus Banister, MD;  Location: [facility] ENDOSCOPY;  Service: Endoscopy;  Laterality: N/A;   EUS N/A 02/03/2016   Procedure: UPPER ENDOSCOPIC ULTRASOUND (EUS) RADIAL;  Surgeon: Milus Banister, MD;  Location: [facility] ENDOSCOPY;  Service: Endoscopy;  Laterality: N/A;   EUS N/A 02/07/2018   Procedure: UPPER ENDOSCOPIC ULTRASOUND (EUS) RADIAL;  Surgeon: Milus Banister, MD;  Location: [facility] ENDOSCOPY;  Service: Endoscopy;  Laterality: N/A;   EYE SURGERY     laser surgery for  glaucoma   HERNIA REPAIR Left    groin   TONSILLECTOMY     UPPER ESOPHAGEAL ENDOSCOPIC ULTRASOUND (EUS) N/A 10/21/2020   Procedure: UPPER ESOPHAGEAL ENDOSCOPIC ULTRASOUND (EUS);  Surgeon: Milus Banister, MD;  Location: [facility] ENDOSCOPY;  Service: Endoscopy;  Laterality: N/A;    Current Outpatient Medications  Medication Sig Dispense Refill   acetaminophen (TYLENOL) 500 MG tablet Take 500-1,000 mg by mouth every 6 (six) hours as needed (PAIN).     cholecalciferol (VITAMIN D) 1000 units tablet Take 1,000 Units by mouth every evening.     dofetilide (TIKOSYN) 500 MCG capsule Take 1 capsule by mouth twice daily 60 capsule 6   dorzolamide-timolol (COSOPT) 22.3-6.8 MG/ML ophthalmic solution Place 1 drop into both eyes 2 (two) times daily.      latanoprost (XALATAN) 0.005 % ophthalmic solution Place 1 drop into both eyes at bedtime.  Multiple Vitamin (MULTIVITAMIN WITH MINERALS) TABS tablet Take 1 tablet by mouth every evening. CENTRUM SILVER FOR MEN 50+     sildenafil (REVATIO) 20 MG tablet Take 80-100 mg by mouth daily as needed (ERECTILE DYSFUNCTION).  8   tacrolimus (PROTOPIC) 0.1 % ointment 1 application     XARELTO 20 MG TABS tablet TAKE 1 TABLET BY MOUTH ONCE DAILY WITH SUPPER 90 tablet 2   No current facility-administered medications for this encounter.    No Known Allergies  Social History   Socioeconomic History   Marital status: Married    Spouse name: Not on file   Number of children: 3   Years of education: BA   Highest education level: Not on file  Occupational History   Occupation: retired  Tobacco Use   Smoking status: Never   Smokeless tobacco: Never  Vaping Use   Vaping Use: Never used  Substance and Sexual Activity   Alcohol use: Yes    Alcohol/week: 2.0 - 4.0 standard drinks    Types: 1 - 2 Cans of beer, 1 - 2 Standard drinks or equivalent per week    Comment: social - 1-2 beer per  week   Drug use: No   Sexual activity: Yes    Birth control/protection:  None  Other Topics Concern   Not on file  Social History Narrative   Patient drinks 2 cups of caffeine daily.   Patient is right handed.   Social Determinants of Health   Financial Resource Strain: Not on file  Food Insecurity: Not on file  Transportation Needs: Not on file  Physical Activity: Not on file  Stress: Not on file  Social Connections: Not on file  Intimate Partner Violence: Not on file    Family History  Problem Relation Age of Onset   Glaucoma Mother    Dementia Father    Berenice Primas' disease Sister    Heart disease Sister    Migraines Sister    Heart attack Brother    CAD Brother     ROS- All systems are reviewed and negative except as per the HPI above  Physical Exam: Vitals:   09/06/21 1408  BP: (!) 146/74  Pulse: (!) 59  Weight: 96 kg  Height: 6\' 2"  (1.88 m)   Wt Readings from Last 3 Encounters:  09/06/21 96 kg  02/21/21 96.1 kg  10/21/20 93.4 kg    Labs: Lab Results  Component Value Date   NA 138 02/21/2021   K 4.7 02/21/2021   CL 105 02/21/2021   CO2 26 02/21/2021   GLUCOSE 122 (H) 02/21/2021   BUN 13 02/21/2021   CREATININE 0.99 02/21/2021   CALCIUM 8.9 02/21/2021   MG 2.1 02/21/2021   No results found for: INR No results found for: CHOL, HDL, LDLCALC, TRIG   GEN- The patient is well appearing, alert and oriented x 3 today.   Head- normocephalic, atraumatic Eyes-  Sclera clear, conjunctiva pink Ears- hearing intact Oropharynx- clear Neck- supple, no JVP Lymph- no cervical lymphadenopathy Lungs- Clear to ausculation bilaterally, normal work of breathing Heart-  regular rate and rhythm, no murmurs, rubs or gallops, PMI not laterally displaced GI- soft, NT, ND, + BS Extremities- no clubbing, cyanosis, or edema MS- no significant deformity or atrophy Skin- no rash or lesion Psych- euthymic mood, full affect Neuro- strength and sensation are intact  EKG-  Vent. rate 59 BPM PR interval 168 ms QRS duration 110 ms QT/QTcB 442/437  ms P-R-T axes 72 -52 74  Sinus bradycardia Left anterior fascicular block Abnormal ECG When compared with ECG of 21-Feb-2021 09:39,  Echo-Study Conclusions   - Left ventricle: The cavity size was normal. Wall thickness was   normal. Systolic function was normal. The estimated ejection   fraction was in the range of 50% to 55%. Wall motion was normal;   there were no regional wall motion abnormalities. Left   ventricular diastolic function parameters were normal. - Left atrium: The atrium was mildly dilated. Volume/bsa, ES,   (1-plane Simpson&'s, A2C): 43.4 ml/m^2. - Right atrium: The atrium was mildly dilated.   Impressions:   - Compared to the prior study, there has been no significant   interval change.   1.Assessment and Plan: Persistent  Afib Maintaining  SR Has not noted any afib  Continue  Dofetilide at 500 mcg bid, qt stable  Has 30  mg cardizem if needed, for afib , but no episodes reported Continue xarelto with a chadsvasc score of 2  Bmet/mag today   F/u with afib clinic in 6 months  for Tikosyn surveillance  Butch Penny C. Dadrian Ballantine, [facility] [address] [place], [place] [address] [phone]

## 2021-09-14 DIAGNOSIS — Z961 Presence of intraocular lens: Secondary | ICD-10-CM | POA: Diagnosis not present

## 2021-09-14 DIAGNOSIS — H401123 Primary open-angle glaucoma, left eye, severe stage: Secondary | ICD-10-CM | POA: Diagnosis not present

## 2021-09-14 DIAGNOSIS — H04203 Unspecified epiphora, bilateral lacrimal glands: Secondary | ICD-10-CM | POA: Diagnosis not present

## 2021-09-14 DIAGNOSIS — H401111 Primary open-angle glaucoma, right eye, mild stage: Secondary | ICD-10-CM | POA: Diagnosis not present

## 2021-10-18 DIAGNOSIS — R0989 Other specified symptoms and signs involving the circulatory and respiratory systems: Secondary | ICD-10-CM | POA: Diagnosis not present

## 2021-10-18 DIAGNOSIS — J069 Acute upper respiratory infection, unspecified: Secondary | ICD-10-CM | POA: Diagnosis not present

## 2021-10-18 DIAGNOSIS — R066 Hiccough: Secondary | ICD-10-CM | POA: Diagnosis not present

## 2021-10-18 DIAGNOSIS — Z03818 Encounter for observation for suspected exposure to other biological agents ruled out: Secondary | ICD-10-CM | POA: Diagnosis not present

## 2021-10-18 DIAGNOSIS — R059 Cough, unspecified: Secondary | ICD-10-CM | POA: Diagnosis not present

## 2022-01-14 ENCOUNTER — Other Ambulatory Visit (HOSPITAL_COMMUNITY): Payer: Self-pay | Admitting: Nurse Practitioner

## 2022-02-03 DIAGNOSIS — H04203 Unspecified epiphora, bilateral lacrimal glands: Secondary | ICD-10-CM | POA: Diagnosis not present

## 2022-02-03 DIAGNOSIS — Z961 Presence of intraocular lens: Secondary | ICD-10-CM | POA: Diagnosis not present

## 2022-02-03 DIAGNOSIS — H401123 Primary open-angle glaucoma, left eye, severe stage: Secondary | ICD-10-CM | POA: Diagnosis not present

## 2022-02-06 DIAGNOSIS — R142 Eructation: Secondary | ICD-10-CM | POA: Diagnosis not present

## 2022-03-07 ENCOUNTER — Ambulatory Visit (HOSPITAL_COMMUNITY)
Admission: RE | Admit: 2022-03-07 | Discharge: 2022-03-07 | Disposition: A | Discharge status: Home / Self Care | Payer: Medicare Other | Source: Ambulatory Visit | Attending: Nurse Practitioner | Admitting: Nurse Practitioner

## 2022-03-07 ENCOUNTER — Encounter (HOSPITAL_COMMUNITY): Payer: Self-pay | Admitting: Nurse Practitioner

## 2022-03-07 VITALS — BP 124/66 | HR 54 | Ht 74.0 in | Wt 217.8 lb

## 2022-03-07 DIAGNOSIS — Z7901 Long term (current) use of anticoagulants: Secondary | ICD-10-CM | POA: Insufficient documentation

## 2022-03-07 DIAGNOSIS — I4819 Other persistent atrial fibrillation: Secondary | ICD-10-CM | POA: Diagnosis not present

## 2022-03-07 DIAGNOSIS — R7303 Prediabetes: Secondary | ICD-10-CM | POA: Diagnosis not present

## 2022-03-07 LAB — BASIC METABOLIC PANEL
Anion gap: 5 (ref 5–15)
BUN: 19 mg/dL (ref 8–23)
CO2: 28 mmol/L (ref 22–32)
Calcium: 9.2 mg/dL (ref 8.9–10.3)
Chloride: 106 mmol/L (ref 98–111)
Creatinine, Ser: 1.36 mg/dL — ABNORMAL HIGH (ref 0.61–1.24)
GFR, Estimated: 54 mL/min — ABNORMAL LOW (ref >60)
Glucose, Bld: 112 mg/dL — ABNORMAL HIGH (ref 70–99)
Potassium: 4.8 mmol/L (ref 3.5–5.1)
Sodium: 139 mmol/L (ref 135–145)

## 2022-03-07 LAB — MAGNESIUM: Magnesium: 2.1 mg/dL (ref 1.7–2.4)

## 2022-03-07 NOTE — Progress Notes (Addendum)
Primary Care Physician: [name], MD Referring Physician: Dr. 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 Duration of afib unknown but thought to be persistent for months, possibly up to one year as he had not seen a MD since last yearly physical. He was seen by Cardiology who set pt up for cardioversion which was done after loading of Doac x 3 weeks with a chadsvasc score of 2(age, Recently dx pre didabetic). He did shock out but returned to afib after a few days. Pt recently lost 30 lbs over the last few months with dx of pre diabetes. Marland KitchenHe is pending a sleep study. No significant caffeine or alcohol use, no tobacco use. He is in the afib clinic to discuss options to return to [place].  He had a ETT 03/2016, which was low risk, echo showed normal heart function. Baseline EKG in afib shows LAFB as well as an EKG from 2017 in SR .He runs low 60's in SR without any AV blocking agents.  F/u in afib clinic, 10/15. I saw 10/10 and discussed options to restore SR, and pt wanted to thinka about options. He is in clinic today to come into hosptial for [place] admission.  F/u in afib clinic, 11/14, he initially was seen 10/25 after Tikosyn admit and was in [place]. He asked to be seen in the afib clinic today for irregularity in his heart rate. Ekg shows afib with 120 bpm. Later converted  on his own.  F/u in afib clinic for one month f/u after tikosyn. He is in [place] today. BP stable.  F/u in afib clinic, 4/19. He remains in [place] and feels well. Continue on dofetilide and xarelto, no issues.  F/u in afib clinic for surveillance of Tikosyn. He remains in [place], no afib noted and  feels well. He will be having an endoscopy in August and has been ok'ed per M. Supple, PharmD that he can stop xarelto 1-2 days before procedure.  F/u 10/14. He feels well. He has not noted any  afib on tikosyn. No issues with eliquis.  F/u in afib clinic 2/14. He continues on Tikosyn and is doing well, no afib reported. No issues with xarelto. qtc stable.  F/u in afib clinic 08/22/19 for Tikosyn surveillance. He does not report any afib. He and his wife both had mild covid cases after Christmas and since have received both of the vaccines. No issues with bleeding, continues on xarelto with CHA2DS2VASc score of 2.   In for tikosyn surveillance 8/11. He has not noted any breakthrough afib. He feels well. He has had both covid shots. No bleeding issues. Continues on xarelto for a CHA2DS2VASc score of 2.   Pt asked to be seen today, 10/28, for irregular pulse he has noted over the last few days and was concerned he was in afib. His ekg today is normal with a stable qt on Tikosyn. By his description, it sounds like premature contractions. He  had covid booster 3 weeks ago but more recently had his last of the 3  hepatitis vaccines. He has also had a recent case of diverticulitis  treated by PCP. He has not other complaints today.   F/u in afib clinic, 08/19/20, f/u visit for tikosyn surveillance. Qtc is stable. No afib noted. He has been well and feels good.  No issues with anticoagulation.   F/u in afib clinic, 02/21/21,  for surveillance of  tikosyn. He reports no afib. He feels well. No issues with anticoagulation.   F/u in afib clinic, 09/06/21 for Tikosyn surveillance. He reports that he is doing well staying in [place]. Also reports compliance with Tikosyn. No issues with anticoagulation. Qt stable.   F/u in afib clinic, 03/07/22. He continues to do well on Tikosyn. No afib to report. We discussed staying on Tikosyn going forward vrs ablation. He  has done so well on Tikosyn he is hesitate to change but would be interested in hearing more. No issues with xarelto.   Today, he denies symptoms of palpitations, chest pain, shortness of breath, orthopnea, PND, lower extremity edema, dizziness,  presyncope, syncope, or neurologic sequela. The patient is tolerating medications without difficulties and is otherwise without complaint today.   Past Medical History:  Diagnosis Date   Arthritis    mild arthritis- neck shoulders, more right shoulder-no problems at present.   Atrial fibrillation, persistent (HCC)    Cancer (HCC)    GI tumor- stomach tumor "nonmalignant" last check 1 yr ago.   Glaucoma    Headache disorder 03/08/2015   Topamax taken daily-very low grade headache to none now.   History of colonic diverticulitis    left   History of hiatal hernia    omeprazole as needed- "mild"   [name] pupil [facility])    Past Surgical History:  Procedure Laterality Date   CARDIOVERSION N/A 04/10/2017   Procedure: CARDIOVERSION;  Surgeon: Lelon Perla, MD;  Location: [facility] ENDOSCOPY;  Service: Cardiovascular;  Laterality: N/A;   COLONOSCOPY N/A 01/21/2015   Procedure: COLONOSCOPY;  Surgeon: Garlan Fair, MD;  Location: [facility] ENDOSCOPY;  Service: Endoscopy;  Laterality: N/A;-polyp removed in past-benign.   COLONOSCOPY WITH PROPOFOL N/A 10/21/2020   Procedure: COLONOSCOPY WITH PROPOFOL;  Surgeon: Milus Banister, MD;  Location: [facility] ENDOSCOPY;  Service: Endoscopy;  Laterality: N/A;   ESOPHAGOGASTRODUODENOSCOPY (EGD) WITH PROPOFOL N/A 01/21/2015   Procedure: ESOPHAGOGASTRODUODENOSCOPY (EGD) WITH PROPOFOL;  Surgeon: Garlan Fair, MD;  Location: [facility] ENDOSCOPY;  Service: Endoscopy;  Laterality: N/A;   ESOPHAGOGASTRODUODENOSCOPY (EGD) WITH PROPOFOL N/A 10/21/2020   Procedure: ESOPHAGOGASTRODUODENOSCOPY (EGD) WITH PROPOFOL;  Surgeon: Milus Banister, MD;  Location: [facility] ENDOSCOPY;  Service: Endoscopy;  Laterality: N/A;   EUS N/A 02/04/2015   Procedure: UPPER ENDOSCOPIC ULTRASOUND (EUS) LINEAR;  Surgeon: Milus Banister, MD;  Location: [facility] ENDOSCOPY;  Service: Endoscopy;  Laterality: N/A;   EUS N/A 02/03/2016   Procedure: UPPER ENDOSCOPIC ULTRASOUND (EUS) RADIAL;  Surgeon: Milus Banister, MD;   Location: [facility] ENDOSCOPY;  Service: Endoscopy;  Laterality: N/A;   EUS N/A 02/07/2018   Procedure: UPPER ENDOSCOPIC ULTRASOUND (EUS) RADIAL;  Surgeon: Milus Banister, MD;  Location: [facility] ENDOSCOPY;  Service: Endoscopy;  Laterality: N/A;   EYE SURGERY     laser surgery for glaucoma   HERNIA REPAIR Left    groin   TONSILLECTOMY     UPPER ESOPHAGEAL ENDOSCOPIC ULTRASOUND (EUS) N/A 10/21/2020   Procedure: UPPER ESOPHAGEAL ENDOSCOPIC ULTRASOUND (EUS);  Surgeon: Milus Banister, MD;  Location: [facility] ENDOSCOPY;  Service: Endoscopy;  Laterality: N/A;    Current Outpatient Medications  Medication Sig Dispense Refill   acetaminophen (TYLENOL) 500 MG tablet Take 500-1,000 mg by mouth every 6 (six) hours as needed (PAIN).     cholecalciferol (VITAMIN D) 1000 units tablet Take 1,000 Units by mouth every evening.     dofetilide (TIKOSYN) 500  MCG capsule Take 1 capsule by mouth twice daily 60 capsule 9   dorzolamide-timolol (COSOPT) 22.3-6.8 MG/ML ophthalmic solution Place 1 drop into both eyes 2 (two) times daily.      famotidine (PEPCID) 20 MG tablet SMARTSIG:1 Tablet(s) By Mouth Every Evening     latanoprost (XALATAN) 0.005 % ophthalmic solution Place 1 drop into both eyes at bedtime.      Multiple Vitamin (MULTIVITAMIN WITH MINERALS) TABS tablet Take 1 tablet by mouth every evening. CENTRUM SILVER FOR MEN 50+     sildenafil (REVATIO) 20 MG tablet Take 80-100 mg by mouth daily as needed (ERECTILE DYSFUNCTION).  8   tacrolimus (PROTOPIC) 0.1 % ointment Apply topically as needed.     XARELTO 20 MG TABS tablet TAKE 1 TABLET BY MOUTH ONCE DAILY WITH SUPPER 90 tablet 9   No current facility-administered medications for this encounter.    No Known Allergies  Social History   Socioeconomic History   Marital status: Married    Spouse name: Not on file   Number of children: 3   Years of education: BA   Highest education level: Not on file  Occupational History   Occupation: retired  Tobacco Use    Smoking status: Never   Smokeless tobacco: Never  Vaping Use   Vaping Use: Never used  Substance and Sexual Activity   Alcohol use: Yes    Alcohol/week: 2.0 - 4.0 standard drinks of alcohol    Types: 1 - 2 Cans of beer, 1 - 2 Standard drinks or equivalent per week    Comment: social - 1-2 beer per  week   Drug use: No   Sexual activity: Yes    Birth control/protection: None  Other Topics Concern   Not on file  Social History Narrative   Patient drinks 2 cups of caffeine daily.   Patient is right handed.   Social Determinants of Health   Financial Resource Strain: Not on file  Food Insecurity: Not on file  Transportation Needs: Not on file  Physical Activity: Not on file  Stress: Not on file  Social Connections: Not on file  Intimate Partner Violence: Not on file    Family History  Problem Relation Age of Onset   Glaucoma Mother    Dementia Father    Berenice Primas' disease Sister    Heart disease Sister    Migraines Sister    Heart attack Brother    CAD Brother     ROS- All systems are reviewed and negative except as per the HPI above  Physical Exam: Vitals:   03/07/22 0900  BP: 124/66  Pulse: (!) 54  Weight: 98.8 kg  Height: '6\' 2"'$  (1.88 m)   Wt Readings from Last 3 Encounters:  03/07/22 98.8 kg  09/06/21 96 kg  02/21/21 96.1 kg    Labs: Lab Results  Component Value Date   NA 139 09/06/2021   K 4.2 09/06/2021   CL 102 09/06/2021   CO2 28 09/06/2021   GLUCOSE 98 09/06/2021   BUN 14 09/06/2021   CREATININE 1.00 09/06/2021   CALCIUM 9.0 09/06/2021   MG 2.2 09/06/2021   No results found for: "INR" No results found for: "CHOL", "HDL", "[place]", "TRIG"   GEN- The patient is well appearing, alert and oriented x 3 today.   Head- normocephalic, atraumatic Eyes-  Sclera clear, conjunctiva pink Ears- hearing intact Oropharynx- clear Neck- supple, no JVP Lymph- no cervical lymphadenopathy Lungs- Clear to ausculation bilaterally, normal work of  breathing Heart-  regular rate and rhythm, no murmurs, rubs or gallops, PMI not laterally displaced GI- soft, NT, ND, + BS Extremities- no clubbing, cyanosis, or edema MS- no significant deformity or atrophy Skin- no rash or lesion Psych- euthymic mood, full affect Neuro- strength and sensation are intact  EKG- Vent. rate 54 BPM PR interval 198 ms QRS duration 118 ms QT/QTcB 430/407 ms P-R-T axes 66 -47 48 Sinus bradycardia Left anterior fascicular block Abnormal ECG When compared with ECG of 06-Sep-2021 14:23, PREVIOUS ECG IS PRESENT  Echo-Study Conclusions   - Left ventricle: The cavity size was normal. Wall thickness was   normal. Systolic function was normal. The estimated ejection   fraction was in the range of 50% to 55%. Wall motion was normal;   there were no regional wall motion abnormalities. Left   ventricular diastolic function parameters were normal. - Left atrium: The atrium was mildly dilated. Volume/bsa, ES,   (1-plane Simpson&'s, A2C): 43.4 ml/m^2. - Right atrium: The atrium was mildly dilated.   Impressions:   - Compared to the prior study, there has been no significant   interval change.   1.Assessment and Plan: Persistent  Afib Maintaining  SR Has not noted any afib  Continue  Dofetilide at 500 mcg bid, qt stable  Has 30  mg cardizem if needed, for afib , but no episodes reported Continue xarelto with a chadsvasc score of 2  Bmet/mag today   Will refer to EP in 6 months  for Tikosyn surveillance and to establish with Dr. Rayann Heman leaving.   Geroge Baseman Adilee Lemme, [facility] [address] [place], [place] [address] [phone]

## 2022-03-17 ENCOUNTER — Ambulatory Visit (HOSPITAL_COMMUNITY)
Admission: RE | Admit: 2022-03-17 | Discharge: 2022-03-17 | Disposition: A | Discharge status: Home / Self Care | Payer: Medicare Other | Source: Ambulatory Visit | Attending: Physician Assistant | Admitting: Physician Assistant

## 2022-03-17 DIAGNOSIS — I4819 Other persistent atrial fibrillation: Secondary | ICD-10-CM | POA: Insufficient documentation

## 2022-03-17 LAB — BASIC METABOLIC PANEL
Anion gap: 7 (ref 5–15)
BUN: 12 mg/dL (ref 8–23)
CO2: 27 mmol/L (ref 22–32)
Calcium: 9.4 mg/dL (ref 8.9–10.3)
Chloride: 106 mmol/L (ref 98–111)
Creatinine, Ser: 1.13 mg/dL (ref 0.61–1.24)
GFR, Estimated: >60 mL/min (ref >60)
Glucose, Bld: 114 mg/dL — ABNORMAL HIGH (ref 70–99)
Potassium: 4.6 mmol/L (ref 3.5–5.1)
Sodium: 140 mmol/L (ref 135–145)

## 2022-03-20 DIAGNOSIS — L821 Other seborrheic keratosis: Secondary | ICD-10-CM | POA: Diagnosis not present

## 2022-03-20 DIAGNOSIS — L812 Freckles: Secondary | ICD-10-CM | POA: Diagnosis not present

## 2022-03-20 DIAGNOSIS — L570 Actinic keratosis: Secondary | ICD-10-CM | POA: Diagnosis not present

## 2022-03-20 DIAGNOSIS — D1801 Hemangioma of skin and subcutaneous tissue: Secondary | ICD-10-CM | POA: Diagnosis not present

## 2022-04-13 DIAGNOSIS — H401123 Primary open-angle glaucoma, left eye, severe stage: Secondary | ICD-10-CM | POA: Diagnosis not present

## 2022-04-13 DIAGNOSIS — Z961 Presence of intraocular lens: Secondary | ICD-10-CM | POA: Diagnosis not present

## 2022-04-13 DIAGNOSIS — H04203 Unspecified epiphora, bilateral lacrimal glands: Secondary | ICD-10-CM | POA: Diagnosis not present

## 2022-05-02 DIAGNOSIS — H401123 Primary open-angle glaucoma, left eye, severe stage: Secondary | ICD-10-CM | POA: Diagnosis not present

## 2022-05-02 DIAGNOSIS — H04203 Unspecified epiphora, bilateral lacrimal glands: Secondary | ICD-10-CM | POA: Diagnosis not present

## 2022-05-02 DIAGNOSIS — Z961 Presence of intraocular lens: Secondary | ICD-10-CM | POA: Diagnosis not present

## 2022-08-02 DIAGNOSIS — G43009 Migraine without aura, not intractable, without status migrainosus: Secondary | ICD-10-CM | POA: Diagnosis not present

## 2022-08-02 DIAGNOSIS — R7303 Prediabetes: Secondary | ICD-10-CM | POA: Diagnosis not present

## 2022-08-02 DIAGNOSIS — Z1331 Encounter for screening for depression: Secondary | ICD-10-CM | POA: Diagnosis not present

## 2022-08-02 DIAGNOSIS — Z125 Encounter for screening for malignant neoplasm of prostate: Secondary | ICD-10-CM | POA: Diagnosis not present

## 2022-08-02 DIAGNOSIS — Z79899 Other long term (current) drug therapy: Secondary | ICD-10-CM | POA: Diagnosis not present

## 2022-08-02 DIAGNOSIS — E559 Vitamin D deficiency, unspecified: Secondary | ICD-10-CM | POA: Diagnosis not present

## 2022-08-02 DIAGNOSIS — I4891 Unspecified atrial fibrillation: Secondary | ICD-10-CM | POA: Diagnosis not present

## 2022-08-02 DIAGNOSIS — H409 Unspecified glaucoma: Secondary | ICD-10-CM | POA: Diagnosis not present

## 2022-08-02 DIAGNOSIS — E7800 Pure hypercholesterolemia, unspecified: Secondary | ICD-10-CM | POA: Diagnosis not present

## 2022-08-02 DIAGNOSIS — Z0000 Encounter for general adult medical examination without abnormal findings: Secondary | ICD-10-CM | POA: Diagnosis not present

## 2022-08-17 ENCOUNTER — Encounter (HOSPITAL_COMMUNITY): Payer: Self-pay | Admitting: *Deleted

## 2022-08-30 DIAGNOSIS — H401123 Primary open-angle glaucoma, left eye, severe stage: Secondary | ICD-10-CM | POA: Diagnosis not present

## 2022-08-30 DIAGNOSIS — Z961 Presence of intraocular lens: Secondary | ICD-10-CM | POA: Diagnosis not present

## 2022-08-30 DIAGNOSIS — H04203 Unspecified epiphora, bilateral lacrimal glands: Secondary | ICD-10-CM | POA: Diagnosis not present

## 2022-09-02 DIAGNOSIS — L42 Pityriasis rosea: Secondary | ICD-10-CM | POA: Diagnosis not present

## 2022-09-02 DIAGNOSIS — R509 Fever, unspecified: Secondary | ICD-10-CM | POA: Diagnosis not present

## 2022-09-02 DIAGNOSIS — Z03818 Encounter for observation for suspected exposure to other biological agents ruled out: Secondary | ICD-10-CM | POA: Diagnosis not present

## 2022-09-05 DIAGNOSIS — B09 Unspecified viral infection characterized by skin and mucous membrane lesions: Secondary | ICD-10-CM | POA: Diagnosis not present

## 2022-09-05 DIAGNOSIS — L820 Inflamed seborrheic keratosis: Secondary | ICD-10-CM | POA: Diagnosis not present

## 2022-10-11 ENCOUNTER — Ambulatory Visit: Payer: Medicare Other | Attending: Cardiology | Admitting: Cardiology

## 2022-10-11 ENCOUNTER — Encounter: Payer: Self-pay | Admitting: Cardiology

## 2022-10-11 VITALS — BP 114/60 | HR 59 | Ht 74.0 in | Wt 220.0 lb

## 2022-10-11 DIAGNOSIS — I4819 Other persistent atrial fibrillation: Secondary | ICD-10-CM | POA: Diagnosis not present

## 2022-10-11 DIAGNOSIS — Z79899 Other long term (current) drug therapy: Secondary | ICD-10-CM

## 2022-10-11 NOTE — Progress Notes (Signed)
Electrophysiology Office Note   Date:  10/11/2022   ID:  Leonard, Williams February 06, 1946, MRN [id number]  PCP:  Leonard Huddle, MD  Cardiologist:  Marlou Porch Primary Electrophysiologist:  Hendricks Schwandt Meredith Leeds, MD    Chief Complaint: AF   History of Present Illness: Leonard Williams is a 77 y.o. male who is being seen today for the evaluation of AF at the request of Leonard Huddle, MD. Presenting today for electrophysiology evaluation.  He has a history seen for atrial fibrillation.  This was found at the time of wellness exam in 2018.  He is being rate controlled.  He is currently on dofetilide.  Today, he denies symptoms of palpitations, chest pain, shortness of breath, orthopnea, PND, lower extremity edema, claudication, dizziness, presyncope, syncope, bleeding, or neurologic sequela. The patient is tolerating medications without difficulties.  Noted no episodes of atrial fibrillation over the last few years.  He has been on dofetilide with quite good control.   Past Medical History:  Diagnosis Date   Arthritis    mild arthritis- neck shoulders, more right shoulder-no problems at present.   Atrial fibrillation, persistent (HCC)    Cancer (HCC)    GI tumor- stomach tumor "nonmalignant" last check 1 yr ago.   Glaucoma    Headache disorder 03/08/2015   Topamax taken daily-very low grade headache to none now.   History of colonic diverticulitis    left   History of hiatal hernia    omeprazole as needed- "mild"   [name])    Past Surgical History:  Procedure Laterality Date   CARDIOVERSION N/A 04/10/2017   Procedure: CARDIOVERSION;  Surgeon: Lelon Perla, MD;  Location: [facility] ENDOSCOPY;  Service: Cardiovascular;  Laterality: N/A;   COLONOSCOPY N/A 01/21/2015   Procedure: COLONOSCOPY;  Surgeon: Garlan Fair, MD;  Location: [facility] ENDOSCOPY;  Service: Endoscopy;  Laterality: N/A;-polyp removed in past-benign.   COLONOSCOPY WITH PROPOFOL N/A 10/21/2020   Procedure:  COLONOSCOPY WITH PROPOFOL;  Surgeon: Milus Banister, MD;  Location: [facility] ENDOSCOPY;  Service: Endoscopy;  Laterality: N/A;   ESOPHAGOGASTRODUODENOSCOPY (EGD) WITH PROPOFOL N/A 01/21/2015   Procedure: ESOPHAGOGASTRODUODENOSCOPY (EGD) WITH PROPOFOL;  Surgeon: Garlan Fair, MD;  Location: [facility] ENDOSCOPY;  Service: Endoscopy;  Laterality: N/A;   ESOPHAGOGASTRODUODENOSCOPY (EGD) WITH PROPOFOL N/A 10/21/2020   Procedure: ESOPHAGOGASTRODUODENOSCOPY (EGD) WITH PROPOFOL;  Surgeon: Milus Banister, MD;  Location: [facility] ENDOSCOPY;  Service: Endoscopy;  Laterality: N/A;   EUS N/A 02/04/2015   Procedure: UPPER ENDOSCOPIC ULTRASOUND (EUS) LINEAR;  Surgeon: Milus Banister, MD;  Location: [facility] ENDOSCOPY;  Service: Endoscopy;  Laterality: N/A;   EUS N/A 02/03/2016   Procedure: UPPER ENDOSCOPIC ULTRASOUND (EUS) RADIAL;  Surgeon: Milus Banister, MD;  Location: [facility] ENDOSCOPY;  Service: Endoscopy;  Laterality: N/A;   EUS N/A 02/07/2018   Procedure: UPPER ENDOSCOPIC ULTRASOUND (EUS) RADIAL;  Surgeon: Milus Banister, MD;  Location: [facility] ENDOSCOPY;  Service: Endoscopy;  Laterality: N/A;   EYE SURGERY     laser surgery for glaucoma   HERNIA REPAIR Left    groin   TONSILLECTOMY     UPPER ESOPHAGEAL ENDOSCOPIC ULTRASOUND (EUS) N/A 10/21/2020   Procedure: UPPER ESOPHAGEAL ENDOSCOPIC ULTRASOUND (EUS);  Surgeon: Milus Banister, MD;  Location: [facility] ENDOSCOPY;  Service: Endoscopy;  Laterality: N/A;     Current Outpatient Medications  Medication Sig Dispense Refill   acetaminophen (TYLENOL) 500 MG tablet Take 500-1,000 mg by mouth every 6 (six) hours as needed (PAIN).     cholecalciferol (  VITAMIN D) 1000 units tablet Take 1,000 Units by mouth every evening.     dofetilide (TIKOSYN) 500 MCG capsule Take 1 capsule by mouth twice daily 60 capsule 9   dorzolamide-timolol (COSOPT) 22.3-6.8 MG/ML ophthalmic solution Place 1 drop into both eyes 2 (two) times daily.      famotidine (PEPCID) 20 MG tablet SMARTSIG:1 Tablet(s) By Mouth  Every Evening     latanoprost (XALATAN) 0.005 % ophthalmic solution Place 1 drop into both eyes at bedtime.      Multiple Vitamin (MULTIVITAMIN WITH MINERALS) TABS tablet Take 1 tablet by mouth every evening. CENTRUM SILVER FOR MEN 50+     sildenafil (REVATIO) 20 MG tablet Take 80-100 mg by mouth daily as needed (ERECTILE DYSFUNCTION).  8   tacrolimus (PROTOPIC) 0.1 % ointment Apply topically as needed.     XARELTO 20 MG TABS tablet TAKE 1 TABLET BY MOUTH ONCE DAILY WITH SUPPER 90 tablet 9   No current facility-administered medications for this visit.    Allergies:   Patient has no known allergies.   Social History:  The patient  reports that he has never smoked. He has never used smokeless tobacco. He reports current alcohol use of about 2.0 - 4.0 standard drinks of alcohol per week. He reports that he does not use drugs.   Family History:  The patient's family history includes CAD in his brother; Dementia in his father; Glaucoma in his mother; Berenice Primas' disease in his sister; Heart attack in his brother; Heart disease in his sister; Migraines in his sister.    ROS:  Please see the history of present illness.   Otherwise, review of systems is positive for none.   All other systems are reviewed and negative.    PHYSICAL EXAM: VS:  There were no vitals taken for this visit. , BMI There is no height or weight on file to calculate BMI. GEN: Well nourished, well developed, in no acute distress  HEENT: normal  Neck: no JVD, carotid bruits, or masses Cardiac: RRR; no murmurs, rubs, or gallops,no edema  Respiratory:  clear to auscultation bilaterally, normal work of breathing GI: soft, nontender, nondistended, + BS MS: no deformity or atrophy  Skin: warm and dry Neuro:  Strength and sensation are intact Psych: euthymic mood, full affect  EKG:  EKG is ordered today. Personal review of the ekg ordered shows sinus rhythm  Recent Labs: 03/07/2022: Magnesium 2.1 03/17/2022: BUN 12; Creatinine,  Ser 1.13; Potassium 4.6; Sodium 140    Lipid Panel  No results found for: "CHOL", "TRIG", "HDL", "CHOLHDL", "VLDL", "LDLCALC", "LDLDIRECT"   Wt Readings from Last 3 Encounters:  03/07/22 217 lb 12.8 oz (98.8 kg)  09/06/21 211 lb 9.6 oz (96 kg)  02/21/21 211 lb 12.8 oz (96.1 kg)      Other studies Reviewed: Additional studies/ records that were reviewed today include: TTE 2018  Review of the above records today demonstrates:  - Left ventricle: The cavity size was normal. Wall thickness was    normal. Systolic function was normal. The estimated ejection    fraction was in the range of 50% to 55%. Wall motion was normal;    there were no regional wall motion abnormalities. Left    ventricular diastolic function parameters were normal.  - Left atrium: The atrium was mildly dilated. Volume/bsa, ES,    (1-plane Simpson&'s, A2C): 43.4 ml/m^2.  - Right atrium: The atrium was mildly dilated.    ASSESSMENT AND PLAN:  1.  Persistent atrial fibrillation: Currently  on dofetilide and Xarelto.  CHA2DS2-VASc of 2.  Remains in [place] rhythm.  Continue with current management.  He does have questions about both ablation and watchman, though he wishes to continue with current management for now.  2.  Secondary hypercoagulable state: Currently on Xarelto for atrial fibrillation  3.  High risk medication monitoring: Currently on dofetilide.  QTc has remained stable.  Recent labs within normal limits.    Current medicines are reviewed at length with the patient today.   The patient does not have concerns regarding his medicines.  The following changes were made today:  none  Labs/ tests ordered today include:  No orders of the defined types were placed in this encounter.    Disposition:   FU 6 months  Signed, Keimora Swartout Meredith Leeds, MD  10/11/2022 7:36 AM     [facility] [address] [place] [address] [phone] (office) [phone] (fax)

## 2022-10-11 NOTE — Patient Instructions (Signed)
Medication Instructions:  Your physician recommends that you continue on your current medications as directed. Please refer to the Current Medication list given to you today.  *If you need a refill on your cardiac medications before your next appointment, please call your pharmacy*  Lab Work: TODAY: BMET, Magnesium If you have labs (blood work) drawn today and your tests are completely normal, you will receive your results only by: [place] (if you have MyChart) OR A paper copy in the mail If you have any lab test that is abnormal or we need to change your treatment, we will call you to review the results.  Testing/Procedures: None  Follow-Up: At [facility], you and your health needs are our priority.  As part of our continuing mission to provide you with exceptional heart care, we have created designated Provider Care Teams.  These Care Teams include your primary Cardiologist (physician) and Advanced Practice Providers (APPs -  Physician Assistants and Nurse Practitioners) who all work together to provide you with the care you need, when you need it.  Your next appointment:   6 month(s)  Provider:   You will follow up in the [facility] located at [facility]. Your provider will be: Clint R. Fenton, PA-C

## 2022-10-12 LAB — BASIC METABOLIC PANEL
BUN/Creatinine Ratio: 14 (ref 10–24)
BUN: 16 mg/dL (ref 8–27)
CO2: 23 mmol/L (ref 20–29)
Calcium: 9.2 mg/dL (ref 8.6–10.2)
Chloride: 104 mmol/L (ref 96–106)
Creatinine, Ser: 1.12 mg/dL (ref 0.76–1.27)
Glucose: 119 mg/dL — ABNORMAL HIGH (ref 70–99)
Potassium: 4.3 mmol/L (ref 3.5–5.2)
Sodium: 142 mmol/L (ref 134–144)
eGFR: 68 mL/min/{1.73_m2} (ref >59)

## 2022-10-12 LAB — MAGNESIUM: Magnesium: 2 mg/dL (ref 1.6–2.3)

## 2022-10-30 ENCOUNTER — Other Ambulatory Visit: Payer: Self-pay

## 2022-10-30 ENCOUNTER — Other Ambulatory Visit (HOSPITAL_COMMUNITY): Payer: Self-pay | Admitting: *Deleted

## 2022-10-30 MED ORDER — DOFETILIDE 500 MCG PO CAPS: 500.0000 ug | ORAL_CAPSULE | Freq: Two times a day (BID) | ORAL | 11 refills | Status: DC

## 2023-01-03 DIAGNOSIS — Z961 Presence of intraocular lens: Secondary | ICD-10-CM | POA: Diagnosis not present

## 2023-01-03 DIAGNOSIS — H401123 Primary open-angle glaucoma, left eye, severe stage: Secondary | ICD-10-CM | POA: Diagnosis not present

## 2023-01-03 DIAGNOSIS — H04203 Unspecified epiphora, bilateral lacrimal glands: Secondary | ICD-10-CM | POA: Diagnosis not present

## 2023-01-08 ENCOUNTER — Telehealth: Payer: Self-pay | Admitting: Cardiology

## 2023-01-08 ENCOUNTER — Encounter: Payer: Self-pay | Admitting: Nurse Practitioner

## 2023-01-08 ENCOUNTER — Ambulatory Visit: Payer: Medicare Other | Attending: Nurse Practitioner | Admitting: Nurse Practitioner

## 2023-01-08 VITALS — BP 118/66 | HR 55 | Ht 74.0 in | Wt 220.6 lb

## 2023-01-08 DIAGNOSIS — D6869 Other thrombophilia: Secondary | ICD-10-CM | POA: Diagnosis not present

## 2023-01-08 DIAGNOSIS — R0609 Other forms of dyspnea: Secondary | ICD-10-CM

## 2023-01-08 DIAGNOSIS — Z79899 Other long term (current) drug therapy: Secondary | ICD-10-CM

## 2023-01-08 DIAGNOSIS — I4819 Other persistent atrial fibrillation: Secondary | ICD-10-CM | POA: Diagnosis not present

## 2023-01-08 DIAGNOSIS — E785 Hyperlipidemia, unspecified: Secondary | ICD-10-CM | POA: Diagnosis not present

## 2023-01-08 LAB — BASIC METABOLIC PANEL
BUN/Creatinine Ratio: 14 (ref 10–24)
BUN: 16 mg/dL (ref 8–27)
CO2: 25 mmol/L (ref 20–29)
Calcium: 9.2 mg/dL (ref 8.6–10.2)
Chloride: 103 mmol/L (ref 96–106)
Creatinine, Ser: 1.15 mg/dL (ref 0.76–1.27)
Glucose: 98 mg/dL (ref 70–99)
Potassium: 4.9 mmol/L (ref 3.5–5.2)
Sodium: 139 mmol/L (ref 134–144)
eGFR: 66 mL/min/{1.73_m2} (ref >59)

## 2023-01-08 MED ORDER — NITROGLYCERIN 0.4 MG SL SUBL: 0.4000 mg | SUBLINGUAL_TABLET | SUBLINGUAL | 3 refills | Status: AC | PRN

## 2023-01-08 MED ORDER — METOPROLOL TARTRATE 50 MG PO TABS
ORAL_TABLET | ORAL | 0 refills | Status: DC
Start: 2023-01-08 — End: 2023-02-26

## 2023-01-08 NOTE — Telephone Encounter (Signed)
Shortness of breath, tightness in back of neck.   Not really nausea - something doesn't feel right.  Has been occurring past 3 days or so.  When he tries to breath deeply he has a hard time getting the last little bit.    He push mowed his grass this morning.   No changes in symptoms.  "We don't eat well and I haven't been exercising since I retired and I'm worried there's a blockage".  Feels he is in NSR.  Hx of afib.  No CAD history.  Scheduled this am with APP.  Pt grateful for assistance.

## 2023-01-08 NOTE — Patient Instructions (Signed)
Medication Instructions:   Your physician recommends that you continue on your current medications as directed. Please refer to the Current Medication list given to you today.   *If you need a refill on your cardiac medications before your next appointment, please call your pharmacy*   Lab Work:  TODAY!!!! BMET  If you have labs (blood work) drawn today and your tests are completely normal, you will receive your results only by: MyChart Message (if you have MyChart) OR A paper copy in the mail If you have any lab test that is abnormal or we need to change your treatment, we will call you to review the results.   Testing/Procedures: Your physician has requested that you have an echocardiogram. Echocardiography is a painless test that uses sound waves to create images of your heart. It provides your doctor with information about the size and shape of your heart and how well your heart's chambers and valves are working. This procedure takes approximately one hour. There are no restrictions for this procedure. Please do NOT wear cologne, aftershave, or lotions (deodorant is allowed). Please arrive 15 minutes prior to your appointment time.    Your cardiac CT will be scheduled at one of the below locations:   [facility] [address] [place], [place] [address] [phone]  If scheduled at [facility], please arrive at the [facility] and Children's Entrance (Entrance C2) of [facility] 30 minutes prior to test start time. You can use the FREE valet parking offered at entrance C (encouraged to control the heart rate for the test)  Proceed to the [facility] Radiology Department (first floor) to check-in and test prep.  All radiology patients and guests should use entrance C2 at [facility], accessed from [facility], even though the hospital's physical address listed is [address]     Please follow these instructions  carefully (unless otherwise directed):  An IV will be required for this test and Nitroglycerin will be given.  Hold all erectile dysfunction medications at least 3 days (72 hrs) prior to test. (Ie viagra, cialis, sildenafil, tadalafil, etc)   On the Night Before the Test: Be sure to Drink plenty of water. Do not consume any caffeinated/decaffeinated beverages or chocolate 12 hours prior to your test. Do not take any antihistamines 12 hours prior to your test.  On the Day of the Test: Drink plenty of water until 1 hour prior to the test. Do not eat any food 1 hour prior to test. You may take your regular medications prior to the test.  Take metoprolol (Lopressor) ONE TABLET BY MOUTH (50 MG) two hours prior to test IF YOUR HEART RATE IS 55 OR ABOVE.        After the Test: Drink plenty of water. After receiving IV contrast, you may experience a mild flushed feeling. This is normal. On occasion, you may experience a mild rash up to 24 hours after the test. This is not dangerous. If this occurs, you can take Benadryl 25 mg and increase your fluid intake. If you experience trouble breathing, this can be serious. If it is severe call 911 IMMEDIATELY. If it is mild, please call our office. We will call to schedule your test 2-4 weeks out understanding that some insurance companies will need an authorization prior to the service being performed.   For more information and frequently asked questions, please visit our website : [email or web address]  For non-scheduling related questions, please contact  the cardiac imaging nurse navigator should you have any questions/concerns: Rockwell Alexandria, Cardiac Imaging Nurse Navigator Larey Brick, Cardiac Imaging Nurse Navigator [facility] Direct Office Dial: [phone]   For scheduling needs, including cancellations and rescheduling, please call [place], [phone].    Follow-Up: At [facility],  you and your health needs are our priority.  As part of our continuing mission to provide you with exceptional heart care, we have created designated Provider Care Teams.  These Care Teams include your primary Cardiologist (physician) and Advanced Practice Providers (APPs -  Physician Assistants and Nurse Practitioners) who all work together to provide you with the care you need, when you need it.  We recommend signing up for the patient portal called "MyChart".  Sign up information is provided on this After Visit Summary.  MyChart is used to connect with patients for Virtual Visits (Telemedicine).  Patients are able to view lab/test results, encounter notes, upcoming appointments, etc.  Non-urgent messages can be sent to your provider as well.   To learn more about what you can do with MyChart, go to [email or web address].    Your next appointment:   7 week(s)  Provider:   Eligha Bridegroom, NP         Other Instructions  Mediterranean Diet A Mediterranean diet refers to food and lifestyle choices that are based on the traditions of countries located on the [place] Sea. It focuses on eating more fruits, vegetables, whole grains, beans, nuts, seeds, and heart-healthy fats, and eating less dairy, meat, eggs, and processed foods with added sugar, salt, and fat. This way of eating has been shown to help prevent certain conditions and improve outcomes for people who have chronic diseases, like kidney disease and heart disease. What are tips for following this plan? Reading food labels Check the serving size of packaged foods. For foods such as rice and pasta, the serving size refers to the amount of cooked product, not dry. Check the total fat in packaged foods. Avoid foods that have saturated fat or trans fats. Check the ingredient list for added sugars, such as corn syrup. Shopping  Buy a variety of foods that offer a balanced diet, including: Fresh fruits and vegetables  (produce). Grains, beans, nuts, and seeds. Some of these may be available in unpackaged forms or large amounts (in bulk). Fresh seafood. Poultry and eggs. Low-fat dairy products. Buy whole ingredients instead of prepackaged foods. Buy fresh fruits and vegetables in-season from local farmers markets. Buy plain frozen fruits and vegetables. If you do not have access to quality fresh seafood, buy precooked frozen shrimp or canned fish, such as tuna, salmon, or sardines. Stock your pantry so you always have certain foods on hand, such as olive oil, canned tuna, canned tomatoes, rice, pasta, and beans. Cooking Cook foods with extra-virgin olive oil instead of using butter or other vegetable oils. Have meat as a side dish, and have vegetables or grains as your main dish. This means having meat in small portions or adding small amounts of meat to foods like pasta or stew. Use beans or vegetables instead of meat in common dishes like chili or lasagna. Experiment with different cooking methods. Try roasting, broiling, steaming, and sauting vegetables. Add frozen vegetables to soups, stews, pasta, or rice. Add nuts or seeds for added healthy fats and plant protein at each meal. You can add these to yogurt, salads, or vegetable dishes. Marinate fish or vegetables using olive oil, lemon juice, garlic, and fresh  herbs. Meal planning Plan to eat one vegetarian meal one day each week. Try to work up to two vegetarian meals, if possible. Eat seafood two or more times a week. Have healthy snacks readily available, such as: Vegetable sticks with hummus. Greek yogurt. Fruit and nut trail mix. Eat balanced meals throughout the week. This includes: Fruit: 2-3 servings a day. Vegetables: 4-5 servings a day. Low-fat dairy: 2 servings a day. Fish, poultry, or lean meat: 1 serving a day. Beans and legumes: 2 or more servings a week. Nuts and seeds: 1-2 servings a day. Whole grains: 6-8 servings a  day. Extra-virgin olive oil: 3-4 servings a day. Limit red meat and sweets to only a few servings a month. Lifestyle  Cook and eat meals together with your family, when possible. Drink enough fluid to keep your urine pale yellow. Be physically active every day. This includes: Aerobic exercise like running or swimming. Leisure activities like gardening, walking, or housework. Get 7-8 hours of sleep each night. If recommended by your health care provider, drink red wine in moderation. This means 1 glass a day for nonpregnant women and 2 glasses a day for men. A glass of wine equals 5 oz (150 mL). What foods should I eat? Fruits Apples. Apricots. Avocado. Berries. Bananas. Cherries. Dates. Figs. Grapes. Lemons. Melon. Oranges. Peaches. Plums. Pomegranate. Vegetables Artichokes. Beets. Broccoli. Cabbage. Carrots. Eggplant. Green beans. Chard. Kale. Spinach. Onions. Leeks. Peas. Squash. Tomatoes. Peppers. Radishes. Grains Whole-grain pasta. Brown rice. Bulgur wheat. Polenta. Couscous. Whole-wheat bread. Orpah Cobb. Meats and other proteins Beans. Almonds. Sunflower seeds. Pine nuts. Peanuts. Cod. Salmon. Scallops. Shrimp. Tuna. Tilapia. Clams. Oysters. Eggs. Poultry without skin. Dairy Low-fat milk. Cheese. Greek yogurt. Fats and oils Extra-virgin olive oil. Avocado oil. Grapeseed oil. Beverages Water. Red wine. Herbal tea. Sweets and desserts Greek yogurt with honey. Baked apples. Poached pears. Trail mix. Seasonings and condiments Basil. Cilantro. Coriander. Cumin. Mint. Parsley. Sage. Rosemary. Tarragon. Garlic. Oregano. Thyme. Pepper. Balsamic vinegar. Tahini. Hummus. Tomato sauce. Olives. Mushrooms. The items listed above may not be a complete list of foods and beverages you can eat. Contact a dietitian for more information. What foods should I limit? This is a list of foods that should be eaten rarely or only on special occasions. Fruits Fruit canned in  syrup. Vegetables Deep-fried potatoes (french fries). Grains Prepackaged pasta or rice dishes. Prepackaged cereal with added sugar. Prepackaged snacks with added sugar. Meats and other proteins Beef. Pork. Lamb. Poultry with skin. Hot dogs. Tomasa Blase. Dairy Ice cream. Sour cream. Whole milk. Fats and oils Butter. Canola oil. Vegetable oil. Beef fat (tallow). Lard. Beverages Juice. Sugar-sweetened soft drinks. Beer. Liquor and spirits. Sweets and desserts Cookies. Cakes. Pies. Candy. Seasonings and condiments Mayonnaise. Pre-made sauces and marinades. The items listed above may not be a complete list of foods and beverages you should limit. Contact a dietitian for more information. Summary The Mediterranean diet includes both food and lifestyle choices. Eat a variety of fresh fruits and vegetables, beans, nuts, seeds, and whole grains. Limit the amount of red meat and sweets that you eat. If recommended by your health care provider, drink red wine in moderation. This means 1 glass a day for nonpregnant women and 2 glasses a day for men. A glass of wine equals 5 oz (150 mL). This information is not intended to replace advice given to you by your health care provider. Make sure you discuss any questions you have with your health care provider. Document Revised: 08/01/2019 Document Reviewed:  05/29/2019 Elsevier Patient Education  2023 Elsevier Inc. Adopting a Healthy Lifestyle.   Weight: Know what a healthy weight is for you (roughly BMI <25) and aim to maintain this. You can calculate your body mass index on your smart phone  Diet: Aim for 7+ servings of fruits and vegetables daily Limit animal fats in diet for cholesterol and heart health - choose grass fed whenever available Avoid highly processed foods (fast food burgers, tacos, fried chicken, pizza, hot dogs, french fries)  Saturated fat comes in the form of butter, lard, coconut oil, margarine, partially hydrogenated oils, and fat in  meat. These increase your risk of cardiovascular disease.  Use healthy plant oils, such as olive, canola, soy, corn, sunflower and peanut.  Whole foods such as fruits, vegetables and whole grains have fiber  Men need > 38 grams of fiber per day Women need > 25 grams of fiber per day  Load up on vegetables and fruits - one-half of your plate: Aim for color and variety, and remember that potatoes dont count. Go for whole grains - one-quarter of your plate: Whole wheat, barley, wheat berries, quinoa, oats, brown rice, and foods made with them. If you want pasta, go with whole wheat pasta. Protein power - one-quarter of your plate: Fish, chicken, beans, and nuts are all healthy, versatile protein sources. Limit red meat. You need carbohydrates for energy! The type of carbohydrate is more important than the amount. Choose carbohydrates such as vegetables, fruits, whole grains, beans, and nuts in the place of white rice, white pasta, potatoes (baked or fried), macaroni and cheese, cakes, cookies, and donuts.  If youre thirsty, drink water. Coffee and tea are good in moderation, but skip sugary drinks and limit milk and dairy products to one or two daily servings. Keep sugar intake at 6 teaspoons or 24 grams or LESS       Exercise: Aim for 150 min of moderate intensity exercise weekly for heart health, and weights twice weekly for bone health Stay active - any steps are better than no steps! Aim for 7-9 hours of sleep daily

## 2023-01-08 NOTE — Telephone Encounter (Signed)
Pt c/o Shortness Of Breath: STAT if SOB developed within the last 24 hours or pt is noticeably SOB on the phone  1. Are you currently SOB (can you hear that pt is SOB on the phone)? no  2. How long have you been experiencing SOB? Couple of days  3. Are you SOB when sitting or when up moving around? Moving around ( it comes and goes)  4. Are you currently experiencing any other symptoms? Tightness in neck, nausea

## 2023-01-08 NOTE — Progress Notes (Signed)
Cardiology Office Note:  .   Date:  01/08/2023  ID:  Leonard Williams, DOB [birth year], MRN [id number] PCP: Marden Noble, MD (Inactive)  [facility] Providers Cardiologist:  None Electrophysiologist:  Will Jorja Loa, MD    Patient Profile: .      PMH Persistent atrial fibrillation On Xarelto for stroke prevention for CHA2DS2-VASc score of 2 On dofetilide for rhythm control History of echocardiogram 03/2016 with normal LVEF, G1DD 04/2017 with mildly reduced LVEF 50-55% History of exercise tolerance test Normal ECG stress test 03/23/2016   Last cardiology clinic visit was 10/11/2022 with Dr. Elberta Fortis at which time he was maintaining sinus rhythm. QTc was stable at that office visit.  He was advised to return for follow-up in 6 months.        History of Present Illness: .   Leonard Williams is a very pleasant 77 y.o. male here with his wife for evaluation of new onset shortness of breath and tightness in back of neck. Reports he has occasional nausea due to chronic headaches, but feels it may have worsened slightly recently. Symptoms started a few days ago while sitting in his recliner. Has walked 4 miles this week without significant symptoms. [facility] Feels like his walking is slower than 3 months ago. Walks with a walking stick for balance. Can walk 8-9 miles when hunting. Occasional lightheadedness that he feels is 2/2 dehydration.  Mild sleep apnea, no CPAP recommended. Family hx early CAD - younger brother has 3 stents, grandfather died in 21s from MI, sister has arrhythmias.  Admits to frequent consumption of high fat diet - burgers, fries, fried fish, and ice cream. Admits he loves buttered yeast rolls.   ROS: + SOB, tightness in back of neck, nausea       Studies Reviewed: Marland Kitchen   EKG Interpretation Date/Time:  Monday January 08 2023 10:18:18 EDT Ventricular Rate:  55 PR Interval:  190 QRS Duration:  106 QT Interval:  444 QTC  Calculation: 424 R Axis:   -47  Text Interpretation: Sinus bradycardia Left anterior fascicular block When compared with ECG of 07-Mar-2022 09:07, No significant change was found Confirmed by Eligha Bridegroom [phone]) on 01/08/2023 10:25:26 AM     Risk Assessment/Calculations:    CHA2DS2-VASc Score = 3   This indicates a 3.2% annual risk of stroke. The patient's score is based upon: CHF History: 0 HTN History: 1 Diabetes History: 0 Stroke History: 0 Vascular Disease History: 0 Age Score: 2 Gender Score: 0            Physical Exam:   VS:  BP 118/66   Pulse (!) 55   Ht 6\' 2"  (1.88 m)   Wt 220 lb 9.6 oz (100.1 kg)   SpO2 97%   BMI 28.32 kg/m    Wt Readings from Last 3 Encounters:  01/08/23 220 lb 9.6 oz (100.1 kg)  10/11/22 220 lb (99.8 kg)  03/07/22 217 lb 12.8 oz (98.8 kg)    GEN: Well nourished, well developed in no acute distress NECK: No JVD; No carotid bruits CARDIAC: RRR, no murmurs, rubs, gallops RESPIRATORY:  Clear to auscultation without rales, wheezing or rhonchi  ABDOMEN: Soft, non-tender, non-distended EXTREMITIES:  No edema; No deformity     ASSESSMENT AND PLAN: .    DOE/? Angina: Symptoms of neck tightness, nausea, and DOE x 5 days. Has remained active but admits he walks slower than he did 3 months ago. Symptoms are concerning  for angina. Normal ETT 2017. We will get coronary CTA for evaluation of coronary artery disease that may be contributing to symptoms. We will get echo for evaluation of heart and valve function in the setting of persistent atrial fib and new onset of symptoms.  Advised him on using sublingual nitroglycerin as needed. Will defer starting aspirin at this time due to [facility]. No BB in setting of bradycardia.   Sinus bradycardia/Persistent atrial fibrillation on chronic anticoagulation: HR is well-controlled, has sinus bradycardia at 55 bpm on EKG. He reports HR typically high 40s to low 50s bpm. Occasional lightheadedness that he feels is  secondary to dehydration. HR increases with exercise but not significantly. Continue Tikosyn. No bleeding concerns. Continue Xarelto 20 mg daily which is adequate dose for stroke prevention for CHA2DS2-VASc score of 3. Management per EP/A Fib clinic.   High risk medication use: On dofetilide. QTc is stable. Renal function has been stable, we are rechecking BMP in preparation for coronary CTA.    Hypertension: BP initially elevated, but improved on my recheck. [facility].  No changes to medications today.  Hyperlipidemia LDL goal < 70: LDL 94 on 08/02/2022.  Lengthy discussion about LDL goal less than 70 and likely need for statin therapy. He would like to await coronary CT results for further discussion of starting medication. Says he has a lot of room for improvement on his diet. Encouraged mostly plant-based diet and 150 minutes of moderate intensity exercise.        Dispo: 6-8 weeks with me  Signed, Eligha Bridegroom, NP-C

## 2023-01-09 ENCOUNTER — Ambulatory Visit (HOSPITAL_COMMUNITY): Payer: Medicare Other | Attending: Internal Medicine

## 2023-01-09 DIAGNOSIS — I4819 Other persistent atrial fibrillation: Secondary | ICD-10-CM | POA: Diagnosis not present

## 2023-01-09 DIAGNOSIS — R0609 Other forms of dyspnea: Secondary | ICD-10-CM | POA: Diagnosis not present

## 2023-01-09 LAB — ECHOCARDIOGRAM COMPLETE
Area-P 1/2: 3.29 cm2
MV M vel: 6.09 m/s
MV Peak grad: 148.4 mmHg
S' Lateral: 3.7 cm

## 2023-01-09 NOTE — Progress Notes (Signed)
Pt has been made aware of normal result and verbalized understanding.  jw

## 2023-01-15 ENCOUNTER — Telehealth (HOSPITAL_COMMUNITY): Payer: Self-pay | Admitting: *Deleted

## 2023-01-15 NOTE — Telephone Encounter (Signed)
Reaching out to patient to offer assistance regarding upcoming cardiac imaging study; pt verbalizes understanding of appt date/time, parking situation and where to check in, pre-test NPO status and medications ordered, and verified current allergies; name and call back number provided for further questions should they arise  Larey Brick RN Navigator Cardiac Imaging [facility] [phone] office [phone] cell  Patient reports HR is in the 50's. He will only take 50mg  metoprolol tartrate if his HR is greater than 65 bpm two hours prior to his cardiac CT scan. He is aware to arrive at 12pm.

## 2023-01-16 ENCOUNTER — Ambulatory Visit (HOSPITAL_COMMUNITY)
Admission: RE | Admit: 2023-01-16 | Discharge: 2023-01-16 | Disposition: A | Discharge status: Home / Self Care | Payer: Medicare Other | Source: Ambulatory Visit | Attending: Nurse Practitioner | Admitting: Nurse Practitioner

## 2023-01-16 DIAGNOSIS — R0609 Other forms of dyspnea: Secondary | ICD-10-CM | POA: Diagnosis not present

## 2023-01-16 DIAGNOSIS — I4819 Other persistent atrial fibrillation: Secondary | ICD-10-CM

## 2023-01-16 MED ORDER — IOHEXOL 350 MG/ML SOLN
95.0000 mL | Freq: Once | INTRAVENOUS | Status: AC | PRN
  Administered 2023-01-16: 95 mL via INTRAVENOUS

## 2023-01-16 MED ORDER — NITROGLYCERIN 0.4 MG SL SUBL
SUBLINGUAL_TABLET | SUBLINGUAL | Status: AC
  Filled 2023-01-16: qty 2

## 2023-01-16 MED ORDER — NITROGLYCERIN 0.4 MG SL SUBL
0.8000 mg | SUBLINGUAL_TABLET | Freq: Once | SUBLINGUAL | Status: AC
  Administered 2023-01-16: 0.8 mg via SUBLINGUAL

## 2023-01-16 NOTE — Progress Notes (Signed)
Ultrasound-guided IV obtained by IV Team RN Shelly. Tolerated contrast injection. IV removed after contrast injection and patient escorted to waiting room post procedure.

## 2023-01-16 NOTE — Progress Notes (Signed)
Patient arrived for Cardiac CT, obtained IV access in right [facility] and when testing in CT, IV malpositioned and no longer usable.  CT Technologist Reita Cliche attempted IV with ultrasound guidance for new IV on left AC, unable to thread catheter.  Explained to patient need to return to IR Holding bay for IV Team consult for new IV access. Verbalized understanding. IV team consult order placed.

## 2023-01-17 ENCOUNTER — Telehealth: Payer: Self-pay | Admitting: *Deleted

## 2023-01-17 MED ORDER — ROSUVASTATIN CALCIUM 10 MG PO TABS: 10.0000 mg | ORAL_TABLET | Freq: Every day | ORAL | 3 refills | Status: DC

## 2023-01-17 NOTE — Telephone Encounter (Signed)
See Cardiac CT results.  Rosuvastatin 10 mg daily sent to [facility].

## 2023-02-13 ENCOUNTER — Other Ambulatory Visit: Payer: Self-pay

## 2023-02-13 MED ORDER — RIVAROXABAN 20 MG PO TABS: 20.0000 mg | ORAL_TABLET | Freq: Every day | ORAL | 1 refills | Status: DC

## 2023-02-13 NOTE — Telephone Encounter (Signed)
Prescription refill request for Xarelto received.  Indication:afib Last office visit:7/24 Weight:100.1  kg Age:77 Scr:1.15  7/24 CrCl:77.37  ml/min  Prescription refilled

## 2023-02-22 NOTE — Progress Notes (Signed)
Cardiology Office Note:  .   Date:  02/26/2023  ID:  ZAK HYKE, DOB [birth year], MRN [id number] PCP: Emilio Aspen, MD  [facility] Providers Cardiologist:  None Electrophysiologist:  Will Jorja Loa, MD    Patient Profile: .      PMH Persistent atrial fibrillation On Xarelto for stroke prevention for CHA2DS2-VASc score of 2 On dofetilide for rhythm control History of echocardiogram 03/2016 with normal LVEF, G1DD 04/2017 with mildly reduced LVEF 50-55% History of exercise tolerance test Normal ECG stress test 03/23/2016 Family history CAD   Referred to cardiology for evaluation of palpitations seen by Dr. Anne Fu in 02/2016.  History of normal nuclear stress test in 2005.  Normal GXT 03/23/2016, 2D echo with LVEF 55%, G1 DD.  24-hour monitor showed frequent PVCs at 700, occasional PACs, brief PAT and no atrial fibrillation.  He saw Dr. Kevan Ny 02/2017 for a wellness visit and screening EKG showed atrial fibrillation at 78 bpm.  He was started on Xarelto 20 mg daily and referred back to cardiology.  He has maintained consistent follow-up with cardiology, mainly followed by A Fib clinic.   At cardiology clinic visit 10/11/2022 with Dr. Elberta Fortis,  he was maintaining sinus rhythm. QTc was stable at that office visit.  He was advised to return for follow-up in 6 months.   Seen in clinic by me on 01/08/23 with his wife for evaluation of new onset shortness of breath and tightness in back of neck. Reports he has occasional nausea due to chronic headaches, but feels it may have worsened slightly recently. Symptoms started a few days ago while sitting in his recliner. Has walked 4 miles this week without significant symptoms. [facility] Feels like his walking is slower than 3 months ago. Walks with a walking stick for balance. Can walk 8-9 miles when hunting. Occasional lightheadedness that he feels is 2/2 dehydration. Mild sleep apnea, no CPAP recommended. Family  hx early CAD - younger brother has 3 stents, grandfather died in 52s from MI, sister has arrhythmias.  Admits to frequent consumption of high fat diet - burgers, fries, fried fish, and ice cream. Admitted he loves buttered yeast rolls. Echo and coronary CTA ordered due to symptoms.   TTE completed 01/09/23 revealed LVEF 55 to 60%, normal diastolic parameters, no RWMA, normal RV, mildly dilated LA, mild MR. Coronary CTA 01/16/23 revealed CAC score of 104, mild nonobstructive CAD.        History of Present Illness: .   CASSEY BATESON is a very pleasant 77 y.o. male here today for follow-up.  He reports he had started to feel improvement in symptoms just prior to coronary CTA. Since starting rosuvastatin, he reports an increase in muscle and joint pain particularly in his neck and shoulders.  He is also having sleep disturbance and currently has dysuria and wonders if he has a UTI. He is able to complete his usual activities at home including mowing grass and yard work, but when he attempts to do more than that, he feels weak. He attributes this to statin therapy because symptoms had improved, then worsened after starting rosuvastatin.  He denies chest pain, shortness of breath, lower extremity edema, fatigue, palpitations, melena, presyncope, syncope, orthopnea, and PND. He is working on eating a better diet, including limiting saturated fat.    ROS: + muscle and joint pain + weakness       Studies Reviewed: .  Risk Assessment/Calculations:    CHA2DS2-VASc Score = 3   This indicates a 3.2% annual risk of stroke. The patient's score is based upon: CHF History: 0 HTN History: 1 Diabetes History: 0 Stroke History: 0 Vascular Disease History: 0 Age Score: 2 Gender Score: 0            Physical Exam:   VS:  BP 124/60   Pulse 65   Ht 6\' 2"  (1.88 m)   Wt 220 lb (99.8 kg)   SpO2 96%   BMI 28.25 kg/m    Wt Readings from Last 3 Encounters:  02/26/23 220 lb (99.8 kg)  01/08/23 220  lb 9.6 oz (100.1 kg)  10/11/22 220 lb (99.8 kg)    GEN: Well nourished, well developed in no acute distress NECK: No JVD; No carotid bruits CARDIAC: RRR, no murmurs, rubs, gallops RESPIRATORY:  Clear to auscultation without rales, wheezing or rhonchi  ABDOMEN: Soft, non-tender, non-distended EXTREMITIES:  No edema; No deformity     ASSESSMENT AND PLAN: .    Weakness: Initially seen by me 01/08/23 for symptoms of DOE, nausea and weakness. He underwent ischemia evaluation including coronary CTA which showed no obstructive CAD, mixed plaque in proximal RCA, calcified plaque in LCx, mixed plaque in LAD with CAC score 104 (32nd percentile). TTE revealed normal heart function, mildly dilated LA, and mild MR. Symptoms improved prior to CT on 7/9 but then worsened after starting rosuvastatin. He continues to have muscle and joint discomfort and weakness starting rosuvastatin.  He denies DOE, nausea, activity intolerance, chest pain. We will have him hold rosuvastatin for 3-4 weeks until symptoms improve, then resume rosuvastatin 5 mg daily.   CAD without angina: Mild nonobstructive CAD on coronary CTA 01/16/2023.  As discussed above, symptoms are not consistent with angina.  He is not on aspirin due to need for [facility].  We will continue to work to achieve LDL goal < 70. We will get NMR lipid profile, apolipoprotein b and Lp(a) for further risk stratification.   Persistent atrial fibrillation on chronic anticoagulation: HR is well-controlled. He reports no episodes of atrial fib since starting dofetilide to his awareness. Continue Tikosyn. Continue Xarelto 20 mg daily which is adequate dose for stroke prevention for CHA2DS2-VASc score of 3. No bleeding concerns. Management per EP/A Fib clinic.   High risk medication use: On dofetilide. QTc stable on EKG 01/08/23. Renal function has been stable. He denies concerning symptoms. No specific concerns with dofetilide today.    Hypertension: BP is well  controlled.  Hyperlipidemia LDL goal < 70: LDL 94 on 08/02/2022. He is making dietary changes to reduce saturated fat. We had a lengthy discussion about management of lipids.  We will get NMR, apolipoprotein B, and lipoprotein a measurements today for further restratification.  He will DC rosuvastatin for 3 to 4 weeks and resume at 5 mg once symptoms of weakness resolved. Plan to pursue PCSK9i or bempedoic acid therapy in addition to low dose statin if LDL remains elevated.        Dispo: Keep your appointment in October with A Fib clinic/6 months with Dr. Anne Fu or me  Signed, Eligha Bridegroom, NP-C

## 2023-02-26 ENCOUNTER — Encounter: Payer: Self-pay | Admitting: Nurse Practitioner

## 2023-02-26 ENCOUNTER — Ambulatory Visit: Payer: Medicare Other | Attending: Nurse Practitioner | Admitting: Nurse Practitioner

## 2023-02-26 VITALS — BP 124/60 | HR 65 | Ht 74.0 in | Wt 220.0 lb

## 2023-02-26 DIAGNOSIS — I4819 Other persistent atrial fibrillation: Secondary | ICD-10-CM

## 2023-02-26 DIAGNOSIS — E785 Hyperlipidemia, unspecified: Secondary | ICD-10-CM

## 2023-02-26 DIAGNOSIS — I2510 Atherosclerotic heart disease of native coronary artery without angina pectoris: Secondary | ICD-10-CM

## 2023-02-26 DIAGNOSIS — R0609 Other forms of dyspnea: Secondary | ICD-10-CM

## 2023-02-26 DIAGNOSIS — R531 Weakness: Secondary | ICD-10-CM

## 2023-02-26 DIAGNOSIS — Z79899 Other long term (current) drug therapy: Secondary | ICD-10-CM

## 2023-02-26 DIAGNOSIS — R300 Dysuria: Secondary | ICD-10-CM

## 2023-02-26 DIAGNOSIS — D6869 Other thrombophilia: Secondary | ICD-10-CM

## 2023-02-26 MED ORDER — ROSUVASTATIN CALCIUM 10 MG PO TABS: 5.0000 mg | ORAL_TABLET | Freq: Every day | ORAL | 3 refills | Status: DC

## 2023-02-26 NOTE — Patient Instructions (Signed)
Medication Instructions:   DECREASE Rosuvastatin one half (0.5) tablet by mouth ( 5 mg) daily. Can restart in 3-4 weeks.   *If you need a refill on your cardiac medications before your next appointment, please call your pharmacy*   Lab Work:  TODAY!!!!! URINE/LPA/LFT/NMR /CBC/Apolipo B.  If you have labs (blood work) drawn today and your tests are completely normal, you will receive your results only by: MyChart Message (if you have MyChart) OR A paper copy in the mail If you have any lab test that is abnormal or we need to change your treatment, we will call you to review the results.   Testing/Procedures:  None ordered.   Follow-Up: At [facility], you and your health needs are our priority.  As part of our continuing mission to provide you with exceptional heart care, we have created designated Provider Care Teams.  These Care Teams include your primary Cardiologist (physician) and Advanced Practice Providers (APPs -  Physician Assistants and Nurse Practitioners) who all work together to provide you with the care you need, when you need it.  We recommend signing up for the patient portal called "MyChart".  Sign up information is provided on this After Visit Summary.  MyChart is used to connect with patients for Virtual Visits (Telemedicine).  Patients are able to view lab/test results, encounter notes, upcoming appointments, etc.  Non-urgent messages can be sent to your provider as well.   To learn more about what you can do with MyChart, go to [email or web address].    Your next appointment:   6 month(s)  Provider:   Eligha Bridegroom, NP         Other Instructions  Your physician wants you to follow-up in: 6 months with Eligha Bridegroom, NP You will receive a reminder letter in the mail two months in advance. If you don't receive a letter, please call our office to schedule the follow-up appointment.

## 2023-02-27 ENCOUNTER — Other Ambulatory Visit: Payer: Self-pay | Admitting: Nurse Practitioner

## 2023-02-27 ENCOUNTER — Other Ambulatory Visit: Payer: Self-pay | Admitting: *Deleted

## 2023-02-27 DIAGNOSIS — N390 Urinary tract infection, site not specified: Secondary | ICD-10-CM

## 2023-02-27 LAB — URINALYSIS
Bilirubin, UA: NEGATIVE
Glucose, UA: NEGATIVE
Nitrite, UA: NEGATIVE
RBC, UA: NEGATIVE
Specific Gravity, UA: 1.027 (ref 1.005–1.030)
Urobilinogen, Ur: 1 mg/dL (ref 0.2–1.0)
pH, UA: 5.5 (ref 5.0–7.5)

## 2023-02-27 LAB — CBC
Hematocrit: 40.3 % (ref 37.5–51.0)
Hemoglobin: 13.2 g/dL (ref 13.0–17.7)
MCH: 28.2 pg (ref 26.6–33.0)
MCHC: 32.8 g/dL (ref 31.5–35.7)
MCV: 86 fL (ref 79–97)
Platelets: 234 10*3/uL (ref 150–450)
RBC: 4.68 x10E6/uL (ref 4.14–5.80)
RDW: 12.6 % (ref 11.6–15.4)
WBC: 8.6 10*3/uL (ref 3.4–10.8)

## 2023-02-27 LAB — HEPATIC FUNCTION PANEL
ALT: 15 IU/L (ref 0–44)
AST: 18 IU/L (ref 0–40)
Albumin: 3.9 g/dL (ref 3.8–4.8)
Alkaline Phosphatase: 53 IU/L (ref 44–121)
Bilirubin Total: 0.5 mg/dL (ref 0.0–1.2)
Bilirubin, Direct: 0.14 mg/dL (ref 0.00–0.40)
Total Protein: 6.3 g/dL (ref 6.0–8.5)

## 2023-02-27 LAB — NMR, LIPOPROFILE
Cholesterol, Total: 125 mg/dL (ref 100–199)
HDL Particle Number: 33.5 umol/L (ref >=30.5)
HDL-C: 65 mg/dL (ref >39)
LDL Particle Number: 429 nmol/L (ref <1000)
LDL Size: 20.3 nm — ABNORMAL LOW (ref >20.5)
LDL-C (NIH Calc): 48 mg/dL (ref 0–99)
LP-IR Score: 25 (ref <=45)
Small LDL Particle Number: 227 nmol/L (ref <=527)
Triglycerides: 51 mg/dL (ref 0–149)

## 2023-02-27 LAB — LIPOPROTEIN A (LPA): Lipoprotein (a): 195.5 nmol/L — ABNORMAL HIGH (ref <75.0)

## 2023-02-27 LAB — APOLIPOPROTEIN B: Apolipoprotein B: 66 mg/dL (ref <90)

## 2023-02-27 MED ORDER — NITROFURANTOIN MONOHYD MACRO 100 MG PO CAPS: 100.0000 mg | ORAL_CAPSULE | Freq: Two times a day (BID) | ORAL | 0 refills | Status: DC

## 2023-02-28 LAB — SPECIMEN STATUS REPORT

## 2023-03-22 DIAGNOSIS — L820 Inflamed seborrheic keratosis: Secondary | ICD-10-CM | POA: Diagnosis not present

## 2023-03-22 DIAGNOSIS — L812 Freckles: Secondary | ICD-10-CM | POA: Diagnosis not present

## 2023-03-22 DIAGNOSIS — D1801 Hemangioma of skin and subcutaneous tissue: Secondary | ICD-10-CM | POA: Diagnosis not present

## 2023-03-22 DIAGNOSIS — S80861A Insect bite (nonvenomous), right lower leg, initial encounter: Secondary | ICD-10-CM | POA: Diagnosis not present

## 2023-03-22 DIAGNOSIS — L821 Other seborrheic keratosis: Secondary | ICD-10-CM | POA: Diagnosis not present

## 2023-03-23 DIAGNOSIS — U071 COVID-19: Secondary | ICD-10-CM | POA: Diagnosis not present

## 2023-04-12 ENCOUNTER — Ambulatory Visit (HOSPITAL_COMMUNITY)
Admission: RE | Admit: 2023-04-12 | Discharge: 2023-04-12 | Disposition: A | Discharge status: Home / Self Care | Payer: Medicare Other | Source: Ambulatory Visit | Attending: Physician Assistant | Admitting: Physician Assistant

## 2023-04-12 ENCOUNTER — Encounter (HOSPITAL_COMMUNITY): Payer: Self-pay | Admitting: Physician Assistant

## 2023-04-12 VITALS — BP 132/78 | HR 54 | Ht 74.0 in | Wt 216.0 lb

## 2023-04-12 DIAGNOSIS — I4819 Other persistent atrial fibrillation: Secondary | ICD-10-CM | POA: Diagnosis not present

## 2023-04-12 DIAGNOSIS — Z79899 Other long term (current) drug therapy: Secondary | ICD-10-CM | POA: Insufficient documentation

## 2023-04-12 DIAGNOSIS — I2510 Atherosclerotic heart disease of native coronary artery without angina pectoris: Secondary | ICD-10-CM | POA: Diagnosis not present

## 2023-04-12 DIAGNOSIS — Z5181 Encounter for therapeutic drug level monitoring: Secondary | ICD-10-CM | POA: Diagnosis not present

## 2023-04-12 DIAGNOSIS — I4891 Unspecified atrial fibrillation: Secondary | ICD-10-CM | POA: Diagnosis not present

## 2023-04-12 DIAGNOSIS — Z7901 Long term (current) use of anticoagulants: Secondary | ICD-10-CM | POA: Insufficient documentation

## 2023-04-12 DIAGNOSIS — R9431 Abnormal electrocardiogram [ECG] [EKG]: Secondary | ICD-10-CM | POA: Diagnosis not present

## 2023-04-12 DIAGNOSIS — D6869 Other thrombophilia: Secondary | ICD-10-CM | POA: Insufficient documentation

## 2023-04-12 LAB — BASIC METABOLIC PANEL
Anion gap: 10 (ref 5–15)
BUN: 19 mg/dL (ref 8–23)
CO2: 22 mmol/L (ref 22–32)
Calcium: 8.7 mg/dL — ABNORMAL LOW (ref 8.9–10.3)
Chloride: 106 mmol/L (ref 98–111)
Creatinine, Ser: 1.08 mg/dL (ref 0.61–1.24)
GFR, Estimated: >60 mL/min (ref >60)
Glucose, Bld: 105 mg/dL — ABNORMAL HIGH (ref 70–99)
Potassium: 4.1 mmol/L (ref 3.5–5.1)
Sodium: 138 mmol/L (ref 135–145)

## 2023-04-12 LAB — MAGNESIUM: Magnesium: 2.2 mg/dL (ref 1.7–2.4)

## 2023-04-12 NOTE — Progress Notes (Signed)
Primary Care Physician: Emilio Aspen, MD Primary Cardiologist: None Electrophysiologist: Will Jorja Loa, MD  Referring Physician: Dr Derinda Late is a 77 y.o. male with a history of CAD, HTN, HLD, atrial fibrillation who presents for follow up in the [facility]. This was found at the time of wellness exam in 2018. He has been maintained on dofetilide. Patient is on Xarelto for a CHADS2VASC score of 4.  On follow up today, patient reports that he has done well since his last visit. No interim symptoms of afib. No bleeding issues on anticoagulation.   Today, he denies symptoms of palpitations, chest pain, shortness of breath, orthopnea, PND, lower extremity edema, dizziness, presyncope, syncope, snoring, daytime somnolence, bleeding, or neurologic sequela. The patient is tolerating medications without difficulties and is otherwise without complaint today.    Atrial Fibrillation Risk Factors:  he does not have symptoms or diagnosis of sleep apnea. he does not have a history of rheumatic fever.   Atrial Fibrillation Management history:  Previous antiarrhythmic drugs: dofetilide  Previous cardioversions: 2018 Previous ablations: none Anticoagulation history: Xarelto   ROS- All systems are reviewed and negative except as per the HPI above.  Past Medical History:  Diagnosis Date   Arthritis    mild arthritis- neck shoulders, more right shoulder-no problems at present.   Atrial fibrillation, persistent (HCC)    Cancer (HCC)    GI tumor- stomach tumor "nonmalignant" last check 1 yr ago.   Glaucoma    Headache disorder 03/08/2015   Topamax taken daily-very low grade headache to none now.   History of colonic diverticulitis    left   History of hiatal hernia    omeprazole as needed- "mild"   [facility])     Current Outpatient Medications  Medication Sig Dispense Refill   acetaminophen (TYLENOL) 500 MG tablet  Take 500-1,000 mg by mouth every 6 (six) hours as needed (PAIN).     cholecalciferol (VITAMIN D) 1000 units tablet Take 1,000 Units by mouth every evening.     dofetilide (TIKOSYN) 500 MCG capsule Take 1 capsule (500 mcg total) by mouth 2 (two) times daily. 60 capsule 11   dorzolamide-timolol (COSOPT) 22.3-6.8 MG/ML ophthalmic solution Place 1 drop into both eyes 2 (two) times daily.      latanoprost (XALATAN) 0.005 % ophthalmic solution Place 1 drop into both eyes at bedtime.      Multiple Vitamin (MULTIVITAMIN WITH MINERALS) TABS tablet Take 1 tablet by mouth every evening. CENTRUM SILVER FOR MEN 50+     nitrofurantoin, macrocrystal-monohydrate, (MACROBID) 100 MG capsule Take 1 capsule (100 mg total) by mouth 2 (two) times daily. 10 capsule 0   nitroGLYCERIN (NITROSTAT) 0.4 MG SL tablet Place 1 tablet (0.4 mg total) under the tongue every 5 (five) minutes as needed for chest pain (DO NOT use within 48 hours of taking sildenafil). 25 tablet 3   rivaroxaban (XARELTO) 20 MG TABS tablet Take 1 tablet (20 mg total) by mouth daily with supper. 90 tablet 1   rosuvastatin (CRESTOR) 10 MG tablet Take 0.5 tablets (5 mg total) by mouth daily. 45 tablet 3   sildenafil (REVATIO) 20 MG tablet Take 80-100 mg by mouth daily as needed (ERECTILE DYSFUNCTION).  8   tacrolimus (PROTOPIC) 0.1 % ointment Apply topically as needed.     No current facility-administered medications for this encounter.    Physical Exam: BP 132/78   Pulse (!) 54   Ht  6\' 2"  (1.88 m)   Wt 98 kg   BMI 27.73 kg/m   GEN: Well nourished, well developed in no acute distress NECK: No JVD; No carotid bruits CARDIAC: Regular rate and rhythm, no murmurs, rubs, gallops RESPIRATORY:  Clear to auscultation without rales, wheezing or rhonchi  ABDOMEN: Soft, non-tender, non-distended EXTREMITIES:  No edema; No deformity   Wt Readings from Last 3 Encounters:  04/12/23 98 kg  02/26/23 99.8 kg  01/08/23 100.1 kg     EKG today demonstrates   SB Vent. rate 54 BPM PR interval 188 ms QRS duration 94 ms QT/QTcB 438/415 ms  Echo 01/09/23 demonstrated   1. Left ventricular ejection fraction, by estimation, is 55 to 60%. The  left ventricle has normal function. The left ventricle has no regional  wall motion abnormalities. Left ventricular diastolic parameters were  normal.   2. Right ventricular systolic function is normal. The right ventricular  size is normal. Tricuspid regurgitation signal is inadequate for assessing  PA pressure.   3. Left atrial size was mildly dilated.   4. The mitral valve is normal in structure. Mild mitral valve  regurgitation. No evidence of mitral stenosis.   5. The aortic valve is tricuspid. Aortic valve regurgitation is trivial.  No aortic stenosis is present.   6. The inferior vena cava is normal in size with greater than 50%  respiratory variability, suggesting right atrial pressure of 3 mmHg.     CHA2DS2-VASc Score = 4  The patient's score is based upon: CHF History: 0 HTN History: 1 Diabetes History: 0 Stroke History: 0 Vascular Disease History: 1 (CAD on CT) Age Score: 2 Gender Score: 0       ASSESSMENT AND PLAN: Persistent Atrial Fibrillation (ICD10:  I48.19) The patient's CHA2DS2-VASc score is 4, indicating a 4.8% annual risk of stroke.   Patient appears to be maintaining SR Continue dofetilide 500 mcg BID, QT stable Check bmet/mag today Continue Xarelto 20 mg daily  Secondary Hypercoagulable State (ICD10:  D68.69) The patient is at significant risk for stroke/thromboembolism based upon his CHA2DS2-VASc Score of 4.  Continue Rivaroxaban (Xarelto).   HTN Stable on current regimen  CAD CAC score 104 on CT No anginal symptoms    Follow up in the AF clinic in 6 months.        Jorja Loa PA-C Afib Clinic [facility] [address] [place], [place] [address] [phone]

## 2023-05-04 DIAGNOSIS — H401123 Primary open-angle glaucoma, left eye, severe stage: Secondary | ICD-10-CM | POA: Diagnosis not present

## 2023-05-04 DIAGNOSIS — Z961 Presence of intraocular lens: Secondary | ICD-10-CM | POA: Diagnosis not present

## 2023-05-04 DIAGNOSIS — H04203 Unspecified epiphora, bilateral lacrimal glands: Secondary | ICD-10-CM | POA: Diagnosis not present

## 2023-06-19 ENCOUNTER — Encounter (HOSPITAL_BASED_OUTPATIENT_CLINIC_OR_DEPARTMENT_OTHER): Payer: Self-pay | Admitting: Emergency Medicine

## 2023-06-19 ENCOUNTER — Other Ambulatory Visit: Payer: Self-pay

## 2023-06-19 DIAGNOSIS — T2014XA Burn of first degree of nose (septum), initial encounter: Secondary | ICD-10-CM | POA: Insufficient documentation

## 2023-06-19 DIAGNOSIS — Y92009 Unspecified place in unspecified non-institutional (private) residence as the place of occurrence of the external cause: Secondary | ICD-10-CM | POA: Insufficient documentation

## 2023-06-19 DIAGNOSIS — X088XXA Exposure to other specified smoke, fire and flames, initial encounter: Secondary | ICD-10-CM | POA: Insufficient documentation

## 2023-06-19 DIAGNOSIS — T310 Burns involving less than 10% of body surface: Secondary | ICD-10-CM | POA: Insufficient documentation

## 2023-06-19 DIAGNOSIS — T2020XA Burn of second degree of head, face, and neck, unspecified site, initial encounter: Secondary | ICD-10-CM | POA: Diagnosis not present

## 2023-06-19 DIAGNOSIS — T2016XA Burn of first degree of forehead and cheek, initial encounter: Secondary | ICD-10-CM | POA: Diagnosis not present

## 2023-06-19 DIAGNOSIS — T23221A Burn of second degree of single right finger (nail) except thumb, initial encounter: Secondary | ICD-10-CM | POA: Insufficient documentation

## 2023-06-19 DIAGNOSIS — Z859 Personal history of malignant neoplasm, unspecified: Secondary | ICD-10-CM | POA: Diagnosis not present
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
## 2023-06-20 ENCOUNTER — Emergency Department (HOSPITAL_BASED_OUTPATIENT_CLINIC_OR_DEPARTMENT_OTHER)
Admission: EM | Admit: 2023-06-20 | Discharge: 2023-06-20 | Disposition: A | Discharge status: Home / Self Care | Payer: Medicare Other | Attending: Emergency Medicine | Admitting: Emergency Medicine

## 2023-06-20 DIAGNOSIS — T300 Burn of unspecified body region, unspecified degree: Secondary | ICD-10-CM
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
## 2023-06-20 NOTE — ED Notes (Signed)
Pt. With singed hair and eyebrows

## 2023-08-13 DIAGNOSIS — Z0000 Encounter for general adult medical examination without abnormal findings: Secondary | ICD-10-CM | POA: Diagnosis not present

## 2023-08-13 DIAGNOSIS — Z125 Encounter for screening for malignant neoplasm of prostate: Secondary | ICD-10-CM | POA: Diagnosis not present

## 2023-08-13 DIAGNOSIS — Z79899 Other long term (current) drug therapy: Secondary | ICD-10-CM | POA: Diagnosis not present

## 2023-08-13 DIAGNOSIS — M545 Low back pain, unspecified: Secondary | ICD-10-CM | POA: Diagnosis not present

## 2023-08-13 DIAGNOSIS — E7800 Pure hypercholesterolemia, unspecified: Secondary | ICD-10-CM | POA: Diagnosis not present

## 2023-08-13 DIAGNOSIS — I4891 Unspecified atrial fibrillation: Secondary | ICD-10-CM | POA: Diagnosis not present

## 2023-08-13 DIAGNOSIS — E559 Vitamin D deficiency, unspecified: Secondary | ICD-10-CM | POA: Diagnosis not present

## 2023-08-13 DIAGNOSIS — Z1331 Encounter for screening for depression: Secondary | ICD-10-CM | POA: Diagnosis not present

## 2023-08-13 DIAGNOSIS — R7303 Prediabetes: Secondary | ICD-10-CM | POA: Diagnosis not present

## 2023-08-25 NOTE — Progress Notes (Unsigned)
 Cardiology Office Note:  .   Date:  08/28/2023  ID:  Leonard Williams, DOB 11-14-1945, MRN [id number] PCP: Emilio Aspen, MD  [facility] Providers Cardiologist:  None Electrophysiologist:  Will Jorja Loa, MD    Patient Profile: .      PMH Persistent atrial fibrillation On Xarelto for stroke prevention for CHA2DS2-VASc score of 4 On dofetilide for rhythm control History of echocardiogram 03/2016 with normal LVEF, G1DD 04/2017 with mildly reduced LVEF 50-55% Coronary artery disease Normal ECG stress test 03/23/2016 Coronary CTA 01/16/2023 CAC score 104 (32nd percentile) Mild nonobstructive CAD Family history CAD   Referred to cardiology for evaluation of palpitations seen by Dr. Anne Fu in 02/2016.  History of normal nuclear stress test in 2005.  Normal GXT 03/23/2016, 2D echo with LVEF 55%, G1 DD.  24-hour monitor showed frequent PVCs at 700, occasional PACs, brief PAT and no atrial fibrillation.  He saw Dr. Kevan Ny 02/2017 for a wellness visit and screening EKG showed atrial fibrillation at 78 bpm.  He was started on Xarelto 20 mg daily and referred back to cardiology.  He has maintained consistent follow-up with cardiology, mainly followed by A Fib clinic.   Seen in clinic by me on 01/08/23 with his wife for evaluation of new onset shortness of breath and tightness in back of neck. Reported occasional nausea due to chronic headaches, but feels it may have worsened slightly recently. Symptoms started a few days prior while sitting in his recliner. Walked 4 miles recently without significant symptoms. Feels like his walking is slower than 3 months ago. Walks with a walking stick for balance. Can walk 8-9 miles when hunting. Occasional lightheadedness that he feels is 2/2 dehydration. Mild sleep apnea, no CPAP recommended. Family hx early CAD. Admits to frequent consumption of high fat diet. Echo and coronary CTA ordered due to symptoms.   TTE completed 01/09/23 revealed LVEF 55  to 60%, normal diastolic parameters, no RWMA, normal RV, mildly dilated LA, mild MR. Coronary CTA 01/16/23 revealed CAC score of 104, mild nonobstructive CAD.   Seen in follow-up by me on 02/26/23 with improvement in symptoms since just prior to CT. Worsening muscle and joint pain particularly in his neck and shoulders since starting rosuvastatin. He was having dysuria so urinalysis was obtained and revealed 1+ leukocytes. He was treated with Macrobid. Able to complete his usual activities at home including mowing grass and yard work, but when he attempts to do more than that, he feels weak. He attributes this to statin therapy because symptoms had improved, then worsened after starting rosuvastatin.  He denied chest pain, shortness of breath, lower extremity edema, fatigue, palpitations, melena, presyncope, syncope, orthopnea, and PND. Working on eating a better diet, including limiting saturated fat.        History of Present Illness: .   Leonard Williams is a very pleasant 78 y.o. male here today for follow-up of CAD. He admits to weight gain over the holidays which he has subsequently lost and is back on track with his self-disciplined diet. He had been taking Crestor 10mg  for his hyperlipidemia, but stopped due to worsening muscle pain. He was splitting the pills for a while but stopped due to the bitterness and hassle of splitting the tablets. He expresses a desire to restart the medication at a 5mg  dose. He would like to discuss Watchman device due to concerns about blood thinners and the risk of falling in the future. He reports that he gets short of  breath when he is hunting due to the elevation in the land.  He walks 4 to 5 miles frequently and does not have any concerning cardiac symptoms. He denies chest pain, lower extremity edema, fatigue, palpitations, melena, presyncope, syncope, orthopnea, and PND.    ROS: See HPI       Studies Reviewed: Marland Kitchen   EKG Interpretation Date/Time:  Tuesday August 28 2023 09:23:44 EST Ventricular Rate:  60 PR Interval:  172 QRS Duration:  94 QT Interval:  426 QTC Calculation: 426 R Axis:   -37  Text Interpretation: Normal sinus rhythm Left axis deviation Incomplete right bundle branch block Nonspecific ST abnormality No significant change since last tracing Confirmed by Eligha Bridegroom [phone]) on 08/28/2023 9:31:26 AM     Risk Assessment/Calculations:    CHA2DS2-VASc Score = 4   This indicates a 4.8% annual risk of stroke. The patient's score is based upon: CHF History: 0 HTN History: 1 Diabetes History: 0 Stroke History: 0 Vascular Disease History: 1 (CAD on CT) Age Score: 2 Gender Score: 0            Physical Exam:   VS:  BP (!) 102/58   Pulse 60   Ht 6\' 2"  (1.88 m)   Wt 212 lb 3.2 oz (96.3 kg)   SpO2 97%   BMI 27.24 kg/m    Wt Readings from Last 3 Encounters:  08/28/23 212 lb 3.2 oz (96.3 kg)  06/20/23 215 lb (97.5 kg)  04/12/23 216 lb (98 kg)    GEN: Well nourished, well developed in no acute distress NECK: No JVD; No carotid bruits CARDIAC: RRR, no murmurs, rubs, gallops RESPIRATORY:  Clear to auscultation without rales, wheezing or rhonchi  ABDOMEN: Soft, non-tender, non-distended EXTREMITIES:  No edema; No deformity     ASSESSMENT AND PLAN: .     CAD without angina: Mild nonobstructive CAD on coronary CTA 01/16/2023. He denies chest pain, dyspnea, or other symptoms concerning for angina. He gets short of breath when hunting due to elevations in the land. He can walk 4-5 miles without concerning cardiac symptoms. No indication for further ischemic evaluation at this time. He is not on aspirin due to need for [facility].  We will continue to work to achieve LDL goal < 70. Brief holiday off statin, will resume Crestor 5 mg daily. We will get repeat lipid panel in 2-3 months. We discussed consideration of repeat CT in the future, no indication for further ischemia evaluation presently.   Persistent atrial fibrillation on chronic  anticoagulation: EKG today reveals normal sinus rhythm at 60 bpm. We reviewed his EKG in detail. QTc is stable. No episodes of atrial fib since starting dofetilide to his awareness. Continue Tikosyn. No bleeding concerns. Continue Xarelto 20 mg daily which is adequate dose for stroke prevention for CHA2DS2-VASc score of 4.  He would like to discuss the possibility of watchman in the future. Encouraged him to keep his follow-up appointment with Jorja Loa, PA in A Fib clinic for further discussion.   High risk medication use: On dofetilide. QTc stable on EKG today. Renal function has been stable. No evidence of a fib. No specific concerns with dofetilide today.    Hyperlipidemia LDL goal < 70/Elevated LP(a): He had significant improvement in LDL to 48 in August 2024 on Crestor 10 mg daily. Unfortunately he had some muscle aches and reduced dose to 5 mg daily and then subsequently d/ced it.  Recent lipid panel 08/13/2023 with total cholesterol 183, HDL 63, LDL  106, and triglycerides 73.  He was not taking Crestor at that time and admitted to dietary indiscretion the few months prior. We will have him resume Crestor 5 mg daily and get repeat lipid panel in 2-3 months. Consider PCSK9i therapy if LDL above goal on maximum tolerated dose of statin.        Disposition: Keep your April appointment with Jorja Loa, PA/6 months with Dr. Anne Fu or me  Signed, Eligha Bridegroom, NP-C

## 2023-08-28 ENCOUNTER — Ambulatory Visit: Payer: Medicare Other | Attending: Nurse Practitioner | Admitting: Nurse Practitioner

## 2023-08-28 ENCOUNTER — Encounter: Payer: Self-pay | Admitting: Nurse Practitioner

## 2023-08-28 VITALS — BP 102/58 | HR 60 | Ht 74.0 in | Wt 212.2 lb

## 2023-08-28 DIAGNOSIS — Z79899 Other long term (current) drug therapy: Secondary | ICD-10-CM

## 2023-08-28 DIAGNOSIS — I2510 Atherosclerotic heart disease of native coronary artery without angina pectoris: Secondary | ICD-10-CM | POA: Diagnosis not present

## 2023-08-28 DIAGNOSIS — I4819 Other persistent atrial fibrillation: Secondary | ICD-10-CM

## 2023-08-28 DIAGNOSIS — E785 Hyperlipidemia, unspecified: Secondary | ICD-10-CM | POA: Diagnosis not present

## 2023-08-28 DIAGNOSIS — D6869 Other thrombophilia: Secondary | ICD-10-CM

## 2023-08-28 DIAGNOSIS — E7841 Elevated Lipoprotein(a): Secondary | ICD-10-CM

## 2023-08-28 MED ORDER — ROSUVASTATIN CALCIUM 5 MG PO TABS: 5.0000 mg | ORAL_TABLET | Freq: Every day | ORAL | 3 refills | Status: DC

## 2023-08-28 NOTE — Patient Instructions (Addendum)
 Medication Instructions:   DECREASE Rosuvastatin one (1) tablet by mouth ( 5 mg) daily.   *If you need a refill on your cardiac medications before your next appointment, please call your pharmacy*   Lab Work:  Your physician recommends that you return for a FASTING lipid profile/alt in 2-3 months fasting after midnight and any lab corp listed below. Pt given paperwork today.    If you have labs (blood work) drawn today and your tests are completely normal, you will receive your results only by: MyChart Message (if you have MyChart) OR A paper copy in the mail If you have any lab test that is abnormal or we need to change your treatment, we will call you to review the results.    Testing/Procedures:  None ordered.   Follow-Up: At [facility], you and your health needs are our priority.  As part of our continuing mission to provide you with exceptional heart care, we have created designated Provider Care Teams.  These Care Teams include your primary Cardiologist (physician) and Advanced Practice Providers (APPs -  Physician Assistants and Nurse Practitioners) who all work together to provide you with the care you need, when you need it.  We recommend signing up for the patient portal called "MyChart".  Sign up information is provided on this After Visit Summary.  MyChart is used to connect with patients for Virtual Visits (Telemedicine).  Patients are able to view lab/test results, encounter notes, upcoming appointments, etc.  Non-urgent messages can be sent to your provider as well.   To learn more about what you can do with MyChart, go to [email or web address].    Your next appointment:   6 month(s)  Provider:   Eligha Bridegroom, NP    Other Instructions

## 2023-09-05 DIAGNOSIS — Z961 Presence of intraocular lens: Secondary | ICD-10-CM | POA: Diagnosis not present

## 2023-09-05 DIAGNOSIS — H401123 Primary open-angle glaucoma, left eye, severe stage: Secondary | ICD-10-CM | POA: Diagnosis not present

## 2023-09-05 DIAGNOSIS — H04203 Unspecified epiphora, bilateral lacrimal glands: Secondary | ICD-10-CM | POA: Diagnosis not present

## 2023-09-26 ENCOUNTER — Telehealth: Payer: Self-pay | Admitting: Gastroenterology

## 2023-09-26 ENCOUNTER — Other Ambulatory Visit: Payer: Self-pay

## 2023-09-26 ENCOUNTER — Telehealth: Payer: Self-pay

## 2023-09-26 DIAGNOSIS — D214 Benign neoplasm of connective and other soft tissue of abdomen: Secondary | ICD-10-CM

## 2023-09-26 DIAGNOSIS — Z860100 Personal history of colon polyps, unspecified: Secondary | ICD-10-CM

## 2023-09-26 NOTE — Telephone Encounter (Signed)
 Patient has history of GIST. Patient's last procedure done 3 years ago. Has been overall stable in the last evaluations. Okay to come in for direct upper EUS next available. Thanks. GM

## 2023-09-26 NOTE — Telephone Encounter (Signed)
Thanks for update. GM 

## 2023-09-26 NOTE — Telephone Encounter (Signed)
 Request for surgical clearance:     Endoscopy Procedure  What type of surgery is being performed?     Colon +EUS   When is this surgery scheduled?     11/26/23  What type of clearance is required ?   Pharmacy  Are there any medications that need to be held prior to surgery and how long? Xarelto x2 days prior to procedure   Practice name and name of physician performing surgery?      [facility]  What is your office phone and fax number?      Phone- [phone]  Fax- [phone]  Anesthesia type (None, local, MAC, general) ?       MAC  Please route your response to Lucky Rathke, CMA

## 2023-09-26 NOTE — Telephone Encounter (Signed)
 Good Morning Dr. Meridee Score, I received a call from this patient stated that he had a EUS done with Dr. Christella Hartigan and it was time to get another done again. Would you please advise on scheduling this patient. Please advise.

## 2023-09-26 NOTE — Telephone Encounter (Signed)
 Patient has been scheduled for office visit 11/09/23 @ 3:10 pm to meet Dr Meridee Score. Patient would also like to discuss having colonoscopy at the same time as EUS. Patient has been scheduled tentatively for EUS+Colonoscopy on 11/26/23 @ [facility].All instructions will be given to patient at time of visit. Patient voiced understanding.

## 2023-09-27 ENCOUNTER — Other Ambulatory Visit: Payer: Self-pay | Admitting: Cardiology

## 2023-09-27 DIAGNOSIS — R002 Palpitations: Secondary | ICD-10-CM | POA: Insufficient documentation

## 2023-09-27 DIAGNOSIS — I2510 Atherosclerotic heart disease of native coronary artery without angina pectoris: Secondary | ICD-10-CM | POA: Insufficient documentation

## 2023-09-27 DIAGNOSIS — Z860101 Personal history of adenomatous and serrated colon polyps: Secondary | ICD-10-CM | POA: Insufficient documentation

## 2023-09-27 DIAGNOSIS — G43909 Migraine, unspecified, not intractable, without status migrainosus: Secondary | ICD-10-CM | POA: Insufficient documentation

## 2023-09-27 NOTE — Telephone Encounter (Signed)
 Pt last saw Eligha Bridegroom, NP on 08/28/23, last labs 04/12/23 Creat 1.08, age 78, weight 96.3kg, CrCl 79.26, based on CrCl pt is on appropriate dosage of Xarelto 20mg  every day for afib.  Will refill rx.

## 2023-09-27 NOTE — Telephone Encounter (Signed)
     Primary Cardiologist: None  Clinical pharmacist have reviewed chart as part of pre-operative protocol coverage.  The following recommendations have been provided for , Roxy Manns :  Patient with diagnosis of PAF on Xarelto for anticoagulation.     Procedure: Colon +EUS  Date of procedure: 11/26/23     CHA2DS2-VASc Score = 4  This indicates a 4.8% annual risk of stroke. The patient's score is based upon: CHF History: 0 HTN History: 1 Diabetes History: 0 Stroke History: 0 Vascular Disease History: 1 Age Score: 2 Gender Score: 0     CrCl 79 ml/min Platelet count 234K     Per office protocol, patient can hold Xarelto for 2 days prior to procedure.  I will route this recommendation to the requesting party via Epic fax function and remove from pre-op pool.  Please call with questions.  Thomasene Ripple. Tina Temme NP-C     09/27/2023, 1:22 PM [facility] [address] Office [phone] Fax [phone]

## 2023-09-27 NOTE — Telephone Encounter (Signed)
 Patient with diagnosis of PAF on Xarelto for anticoagulation.    Procedure: Colon +EUS  Date of procedure: 11/26/23   CHA2DS2-VASc Score = 4  This indicates a 4.8% annual risk of stroke. The patient's score is based upon: CHF History: 0 HTN History: 1 Diabetes History: 0 Stroke History: 0 Vascular Disease History: 1 Age Score: 2 Gender Score: 0    CrCl 79 ml/min Platelet count 234K   Per office protocol, patient can hold Xarelto for 2 days prior to procedure.    **This guidance is not considered finalized until pre-operative APP has relayed final recommendations.**

## 2023-10-02 ENCOUNTER — Other Ambulatory Visit (HOSPITAL_COMMUNITY): Payer: Self-pay | Admitting: *Deleted

## 2023-10-02 MED ORDER — DOFETILIDE 500 MCG PO CAPS: 500.0000 ug | ORAL_CAPSULE | Freq: Two times a day (BID) | ORAL | 2 refills | Status: DC

## 2023-10-02 NOTE — Telephone Encounter (Signed)
 Patient will be notified of clearance at his pre-visit on 11/09/23.

## 2023-10-09 ENCOUNTER — Encounter (HOSPITAL_COMMUNITY): Payer: Self-pay | Admitting: Physician Assistant

## 2023-10-09 ENCOUNTER — Ambulatory Visit (HOSPITAL_COMMUNITY)
Admission: RE | Admit: 2023-10-09 | Discharge: 2023-10-09 | Disposition: A | Discharge status: Home / Self Care | Payer: Medicare Other | Source: Ambulatory Visit | Attending: Physician Assistant | Admitting: Physician Assistant

## 2023-10-09 VITALS — BP 128/70 | HR 52 | Ht 74.0 in | Wt 213.8 lb

## 2023-10-09 DIAGNOSIS — I10 Essential (primary) hypertension: Secondary | ICD-10-CM | POA: Insufficient documentation

## 2023-10-09 DIAGNOSIS — Z7901 Long term (current) use of anticoagulants: Secondary | ICD-10-CM | POA: Diagnosis not present

## 2023-10-09 DIAGNOSIS — Z79899 Other long term (current) drug therapy: Secondary | ICD-10-CM | POA: Diagnosis not present

## 2023-10-09 DIAGNOSIS — I4819 Other persistent atrial fibrillation: Secondary | ICD-10-CM | POA: Insufficient documentation

## 2023-10-09 DIAGNOSIS — D6869 Other thrombophilia: Secondary | ICD-10-CM | POA: Diagnosis not present

## 2023-10-09 DIAGNOSIS — I4891 Unspecified atrial fibrillation: Secondary | ICD-10-CM | POA: Diagnosis not present

## 2023-10-09 DIAGNOSIS — Z5181 Encounter for therapeutic drug level monitoring: Secondary | ICD-10-CM | POA: Diagnosis not present

## 2023-10-09 DIAGNOSIS — I2510 Atherosclerotic heart disease of native coronary artery without angina pectoris: Secondary | ICD-10-CM | POA: Insufficient documentation

## 2023-10-09 DIAGNOSIS — E785 Hyperlipidemia, unspecified: Secondary | ICD-10-CM | POA: Diagnosis not present

## 2023-10-09 LAB — BASIC METABOLIC PANEL WITH GFR
Anion gap: 8 (ref 5–15)
BUN: 16 mg/dL (ref 8–23)
CO2: 26 mmol/L (ref 22–32)
Calcium: 8.9 mg/dL (ref 8.9–10.3)
Chloride: 106 mmol/L (ref 98–111)
Creatinine, Ser: 1 mg/dL (ref 0.61–1.24)
GFR, Estimated: >60 mL/min (ref >60)
Glucose, Bld: 104 mg/dL — ABNORMAL HIGH (ref 70–99)
Potassium: 4.5 mmol/L (ref 3.5–5.1)
Sodium: 140 mmol/L (ref 135–145)

## 2023-10-09 LAB — MAGNESIUM: Magnesium: 2.1 mg/dL (ref 1.7–2.4)

## 2023-10-09 NOTE — Progress Notes (Signed)
 Primary Care Physician: Emilio Aspen, MD Primary Cardiologist: None Electrophysiologist: Will Jorja Loa, MD  Referring Physician: Dr Derinda Late is a 78 y.o. male with a history of CAD, HTN, HLD, atrial fibrillation who presents for follow up in the [facility]. This was found at the time of wellness exam in 2018. He has been maintained on dofetilide. Patient is on Xarelto for stroke prevention.   Patient returns for follow up for atrial fibrillation and dofetilide monitoring. He reports that he has done well since his last visit. He remains in SR. No bleeding issues on anticoagulation.   Today, he  denies symptoms of palpitations, chest pain, shortness of breath, orthopnea, PND, lower extremity edema, dizziness, presyncope, syncope, snoring, daytime somnolence, bleeding, or neurologic sequela. The patient is tolerating medications without difficulties and is otherwise without complaint today.    Atrial Fibrillation Risk Factors:  he does not have symptoms or diagnosis of sleep apnea. he does not have a history of rheumatic fever.   Atrial Fibrillation Management history:  Previous antiarrhythmic drugs: dofetilide  Previous cardioversions: 2018 Previous ablations: none Anticoagulation history: Xarelto   ROS- All systems are reviewed and negative except as per the HPI above.  Past Medical History:  Diagnosis Date   Arthritis    mild arthritis- neck shoulders, more right shoulder-no problems at present.   Atrial fibrillation, persistent (HCC)    Cancer (HCC)    GI tumor- stomach tumor "nonmalignant" last check 1 yr ago.   Glaucoma    Headache disorder 03/08/2015   Topamax taken daily-very low grade headache to none now.   History of colonic diverticulitis    left   History of hiatal hernia    omeprazole as needed- "mild"   Marcus Gunn pupil [facility])     Current Outpatient Medications  Medication Sig Dispense Refill    acetaminophen (TYLENOL) 500 MG tablet Take 500-1,000 mg by mouth every 6 (six) hours as needed (PAIN).     cholecalciferol (VITAMIN D) 1000 units tablet Take 1,000 Units by mouth every evening.     dofetilide (TIKOSYN) 500 MCG capsule Take 1 capsule (500 mcg total) by mouth 2 (two) times daily. 180 capsule 2   dorzolamide-timolol (COSOPT) 22.3-6.8 MG/ML ophthalmic solution Place 1 drop into both eyes 2 (two) times daily.      latanoprost (XALATAN) 0.005 % ophthalmic solution Place 1 drop into both eyes at bedtime.      Multiple Vitamin (MULTIVITAMIN WITH MINERALS) TABS tablet Take 1 tablet by mouth every evening. CENTRUM SILVER FOR MEN 50+     nitrofurantoin, macrocrystal-monohydrate, (MACROBID) 100 MG capsule Take 1 capsule (100 mg total) by mouth 2 (two) times daily. 10 capsule 0   nitroGLYCERIN (NITROSTAT) 0.4 MG SL tablet Place 1 tablet (0.4 mg total) under the tongue every 5 (five) minutes as needed for chest pain (DO NOT use within 48 hours of taking sildenafil). 25 tablet 3   rivaroxaban (XARELTO) 20 MG TABS tablet TAKE 1 TABLET BY MOUTH ONCE DAILY WITH SUPPER 90 tablet 1   rosuvastatin (CRESTOR) 5 MG tablet Take 1 tablet (5 mg total) by mouth daily. 90 tablet 3   sildenafil (REVATIO) 20 MG tablet Take 80-100 mg by mouth daily as needed (ERECTILE DYSFUNCTION).  8   tacrolimus (PROTOPIC) 0.1 % ointment Apply topically as needed.     No current facility-administered medications for this encounter.    Physical Exam: BP 128/70   Pulse (!) 52  Ht 6\' 2"  (1.88 m)   Wt 97 kg   BMI 27.45 kg/m   GEN: Well nourished, well developed in no acute distress CARDIAC: Regular rate and rhythm with occasional ectopy, no murmurs, rubs, gallops RESPIRATORY:  Clear to auscultation without rales, wheezing or rhonchi  ABDOMEN: Soft, non-tender, non-distended EXTREMITIES:  No edema; No deformity    Wt Readings from Last 3 Encounters:  10/09/23 97 kg  08/28/23 96.3 kg  06/20/23 97.5 kg     EKG  today demonstrates  SB, PVC Vent. rate 52 BPM PR interval 192 ms QRS duration 110 ms QT/QTcB 438/407 ms   Echo 01/09/23 demonstrated   1. Left ventricular ejection fraction, by estimation, is 55 to 60%. The  left ventricle has normal function. The left ventricle has no regional  wall motion abnormalities. Left ventricular diastolic parameters were  normal.   2. Right ventricular systolic function is normal. The right ventricular  size is normal. Tricuspid regurgitation signal is inadequate for assessing  PA pressure.   3. Left atrial size was mildly dilated.   4. The mitral valve is normal in structure. Mild mitral valve  regurgitation. No evidence of mitral stenosis.   5. The aortic valve is tricuspid. Aortic valve regurgitation is trivial.  No aortic stenosis is present.   6. The inferior vena cava is normal in size with greater than 50%  respiratory variability, suggesting right atrial pressure of 3 mmHg.     CHA2DS2-VASc Score = 4  The patient's score is based upon: CHF History: 0 HTN History: 1 Diabetes History: 0 Stroke History: 0 Vascular Disease History: 1 Age Score: 2 Gender Score: 0       ASSESSMENT AND PLAN: Persistent Atrial Fibrillation (ICD10:  I48.19) The patient's CHA2DS2-VASc score is 4, indicating a 4.8% annual risk of stroke.   Patient appears to be maintaining SR Continue dofetilide 500 mcg BID Continue Xarelto 20 mg daily  Secondary Hypercoagulable State (ICD10:  D68.69) The patient is at significant risk for stroke/thromboembolism based upon his CHA2DS2-VASc Score of 4.  Continue Rivaroxaban (Xarelto). No bleeding issues. Patient inquires about Watchman today, brochure provided.   High Risk Medication Monitoring (ICD 10: Z79.899) QT interval on ECG acceptable for dofetilide monitoring. Check bmet/mag today.      HTN Stable on current regimen  CAD CAC score 104 on CT No anginal symptoms    Follow up in the AF clinic 6 months.        Jorja Loa PA-C [facility] [address] [place], [place] [address] [phone]

## 2023-11-09 ENCOUNTER — Encounter: Payer: Self-pay | Admitting: Gastroenterology

## 2023-11-09 ENCOUNTER — Ambulatory Visit: Admitting: Gastroenterology

## 2023-11-09 VITALS — BP 138/68 | HR 60 | Ht 74.0 in | Wt 212.2 lb

## 2023-11-09 DIAGNOSIS — Z7901 Long term (current) use of anticoagulants: Secondary | ICD-10-CM

## 2023-11-09 DIAGNOSIS — Z860100 Personal history of colon polyps, unspecified: Secondary | ICD-10-CM

## 2023-11-09 DIAGNOSIS — D214 Benign neoplasm of connective and other soft tissue of abdomen: Secondary | ICD-10-CM

## 2023-11-09 DIAGNOSIS — Z800 Family history of malignant neoplasm of digestive organs: Secondary | ICD-10-CM

## 2023-11-09 DIAGNOSIS — C49A2 Gastrointestinal stromal tumor of stomach: Secondary | ICD-10-CM

## 2023-11-09 DIAGNOSIS — Z1211 Encounter for screening for malignant neoplasm of colon: Secondary | ICD-10-CM

## 2023-11-09 DIAGNOSIS — Z860101 Personal history of adenomatous and serrated colon polyps: Secondary | ICD-10-CM

## 2023-11-09 DIAGNOSIS — K3189 Other diseases of stomach and duodenum: Secondary | ICD-10-CM

## 2023-11-09 NOTE — Patient Instructions (Signed)
 Procedures cancelled.   You will be due for a recall colonoscopy in 2026. We will send you a reminder in the mail when it gets closer to that time.  Due to recent changes in healthcare laws, you may see the results of your imaging and laboratory studies on MyChart before your provider has had a chance to review them.  We understand that in some cases there may be results that are confusing or concerning to you. Not all laboratory results come back in the same time frame and the provider may be waiting for multiple results in order to interpret others.  Please give us  48 hours in order for your provider to thoroughly review all the results before contacting the office for clarification of your results.   Thank you for choosing me and [facility].  Dr. Brice Campi

## 2023-11-09 NOTE — Progress Notes (Unsigned)
 GASTROENTEROLOGY OUTPATIENT CLINIC VISIT   Primary Care Provider Benedetta Bradley, MD [address] [place] [address] [phone]  Referring Provider Benedetta Bradley, MD [address] [place],  [place] [address] [phone]  Patient Profile: Leonard Williams is a 78 y.o. male with a pmh significant for  The patient presents to the [facility] for an evaluation and management of problem(s) noted below:  Problem List No diagnosis found.  History of Present Illness    The patient does/does not take NSAIDs or BC/Goody Powder. Patient has/has not had an EGD. Patient has/has not had a Colonoscopy.  GI Review of Systems Positive as above Negative for  Pyrosis; Reflux; Regurgitation; Dysphagia; Odynophagia; Globus; Post-prandial cough; Nocturnal cough; Nasal regurgitation; Epigastric pain; Nausea; Vomiting; Hematemesis; Jaundice; Change in Appetite; Early satiety; Abdominal pain; Abdominal bloating; Eructation; Flatulence; Change in BM Frequency; Change in BM Consistency; Constipation; Diarrhea; Incontinence; Urgency; Tenesmus; Hematochezia; Melena  Review of Systems General: Denies fevers/chills/weight loss unintentionally Cardiovascular: Denies chest pain Pulmonary: Denies shortness of breath Gastroenterological: See HPI Genitourinary: Denies darkened urine Hematological: Denies easy bruising/bleeding Endocrine: Denies temperature intolerance Dermatological: Denies jaundice Psychological: Mood is stable  Medications Current Outpatient Medications  Medication Sig Dispense Refill   acetaminophen (TYLENOL) 500 MG tablet Take 500-1,000 mg by mouth every 6 (six) hours as needed (PAIN).     cholecalciferol (VITAMIN D) 1000 units tablet Take 1,000 Units by mouth every evening.     dofetilide  (TIKOSYN ) 500 MCG capsule Take 1 capsule (500 mcg total) by mouth 2 (two) times daily. 180 capsule 2    dorzolamide -timolol  (COSOPT ) 22.3-6.8 MG/ML ophthalmic solution Place 1 drop into both eyes 2 (two) times daily.      latanoprost  (XALATAN ) 0.005 % ophthalmic solution Place 1 drop into both eyes at bedtime.      Multiple Vitamin (MULTIVITAMIN WITH MINERALS) TABS tablet Take 1 tablet by mouth every evening. CENTRUM SILVER FOR MEN 50+     nitroGLYCERIN  (NITROSTAT ) 0.4 MG SL tablet Place 1 tablet (0.4 mg total) under the tongue every 5 (five) minutes as needed for chest pain (DO NOT use within 48 hours of taking sildenafil). 25 tablet 3   rivaroxaban  (XARELTO ) 20 MG TABS tablet TAKE 1 TABLET BY MOUTH ONCE DAILY WITH SUPPER 90 tablet 1   rosuvastatin  (CRESTOR ) 5 MG tablet Take 1 tablet (5 mg total) by mouth daily. 90 tablet 3   sildenafil (REVATIO) 20 MG tablet Take 80-100 mg by mouth daily as needed (ERECTILE DYSFUNCTION).  8   tacrolimus (PROTOPIC) 0.1 % ointment Apply topically as needed.     No current facility-administered medications for this visit.    Allergies No Known Allergies  Histories Past Medical History:  Diagnosis Date   Arthritis    mild arthritis- neck shoulders, more right shoulder-no problems at present.   Atrial fibrillation, persistent (HCC)    Cancer (HCC)    GI tumor- stomach tumor "nonmalignant" last check 1 yr ago.   Glaucoma    Headache disorder 03/08/2015   Topamax  taken daily-very low grade headache to none now.   History of colonic diverticulitis    left   History of hiatal hernia    omeprazole as needed- "mild"   Alonzo January pupil [facility])    Past Surgical History:  Procedure Laterality Date   CARDIOVERSION N/A 04/10/2017   Procedure: CARDIOVERSION;  Surgeon: Lenise Quince, MD;  Location: [facility] ENDOSCOPY;  Service: Cardiovascular;  Laterality: N/A;   COLONOSCOPY  N/A 01/21/2015   Procedure: COLONOSCOPY;  Surgeon: Garrett Kallman, MD;  Location: [facility] ENDOSCOPY;  Service: Endoscopy;  Laterality: N/A;-polyp removed in past-benign.   COLONOSCOPY WITH PROPOFOL   N/A 10/21/2020   Procedure: COLONOSCOPY WITH PROPOFOL ;  Surgeon: Janel Medford, MD;  Location: [facility] ENDOSCOPY;  Service: Endoscopy;  Laterality: N/A;   ESOPHAGOGASTRODUODENOSCOPY (EGD) WITH PROPOFOL  N/A 01/21/2015   Procedure: ESOPHAGOGASTRODUODENOSCOPY (EGD) WITH PROPOFOL ;  Surgeon: Garrett Kallman, MD;  Location: [facility] ENDOSCOPY;  Service: Endoscopy;  Laterality: N/A;   ESOPHAGOGASTRODUODENOSCOPY (EGD) WITH PROPOFOL  N/A 10/21/2020   Procedure: ESOPHAGOGASTRODUODENOSCOPY (EGD) WITH PROPOFOL ;  Surgeon: Janel Medford, MD;  Location: [facility] ENDOSCOPY;  Service: Endoscopy;  Laterality: N/A;   EUS N/A 02/04/2015   Procedure: UPPER ENDOSCOPIC ULTRASOUND (EUS) LINEAR;  Surgeon: Janel Medford, MD;  Location: [facility] ENDOSCOPY;  Service: Endoscopy;  Laterality: N/A;   EUS N/A 02/03/2016   Procedure: UPPER ENDOSCOPIC ULTRASOUND (EUS) RADIAL;  Surgeon: Janel Medford, MD;  Location: [facility] ENDOSCOPY;  Service: Endoscopy;  Laterality: N/A;   EUS N/A 02/07/2018   Procedure: UPPER ENDOSCOPIC ULTRASOUND (EUS) RADIAL;  Surgeon: Janel Medford, MD;  Location: [facility] ENDOSCOPY;  Service: Endoscopy;  Laterality: N/A;   EYE SURGERY     laser surgery for glaucoma   HERNIA REPAIR Left    groin   TONSILLECTOMY     UPPER ESOPHAGEAL ENDOSCOPIC ULTRASOUND (EUS) N/A 10/21/2020   Procedure: UPPER ESOPHAGEAL ENDOSCOPIC ULTRASOUND (EUS);  Surgeon: Janel Medford, MD;  Location: [facility] ENDOSCOPY;  Service: Endoscopy;  Laterality: N/A;   Social History   Socioeconomic History   Marital status: Married    Spouse name: Not on file   Number of children: 3   Years of education: BA   Highest education level: Not on file  Occupational History   Occupation: retired  Tobacco Use   Smoking status: Never   Smokeless tobacco: Never   Tobacco comments:    Never smoked 04/12/23  Vaping Use   Vaping status: Never Used  Substance and Sexual Activity   Alcohol use: Yes    Alcohol/week: 2.0 - 4.0 standard drinks of alcohol    Types: 1 - 2 Cans  of beer, 1 - 2 Standard drinks or equivalent per week    Comment: social - 1-2 beer per  week 04/12/23   Drug use: No   Sexual activity: Yes    Birth control/protection: None  Other Topics Concern   Not on file  Social History Narrative   Patient drinks 2 cups of caffeine daily.   Patient is right handed.   Social Drivers of Corporate investment banker Strain: Not on file  Food Insecurity: Not on file  Transportation Needs: Not on file  Physical Activity: Not on file  Stress: Not on file  Social Connections: Not on file  Intimate Partner Violence: Not on file   Family History  Problem Relation Age of Onset   Glaucoma Mother    Dementia Father    Murrell Arrant' disease Sister    Heart disease Sister    Migraines Sister    Heart attack Brother    CAD Brother    I have reviewed his medical, social, and family history in detail and updated the electronic medical record as necessary.    PHYSICAL EXAMINATION  BP 138/68   Pulse 60   Ht 6\' 2"  (1.88 m)   Wt 212 lb 4 oz (96.3 kg)   SpO2 96%   BMI 27.25 kg/m  Wt Readings from  Last 3 Encounters:  11/09/23 212 lb 4 oz (96.3 kg)  10/09/23 213 lb 12.8 oz (97 kg)  08/28/23 212 lb 3.2 oz (96.3 kg)   GEN: NAD, appears stated age, doesn't appear chronically ill PSYCH: Cooperative, without pressured speech EYE: Conjunctivae pink, sclerae anicteric ENT: MMM CV: Nontachycardic RESP: No audible wheezing GI: NABS, soft, NT/ND, without rebound or guarding, no HSM appreciated GU: DRE shows MSK/EXT: No significant lower extremity edema SKIN: No jaundice, no spider angiomata NEURO:  Alert & Oriented x 3, no focal deficits, no evidence of asterixis   REVIEW OF DATA  I reviewed the following data at the time of this encounter:  GI Procedures and Studies  ***  Laboratory Studies  ***  Imaging Studies  ***   ASSESSMENT  Mr. Dinelli is a 78 y.o. male with a pmh significant for The patient is seen today for evaluation and management  of:  No diagnosis found.  ***   PLAN  There are no diagnoses linked to this encounter.   No orders of the defined types were placed in this encounter.   New Prescriptions   No medications on file   Modified Medications   No medications on file    Planned Follow Up No follow-ups on file.   Total Time in Face-to-Face and in Coordination of Care for patient including independent/personal interpretation/review of prior testing, medical history, examination, medication adjustment, communicating results with the patient directly, and documentation within the EHR is ***.   Yong Henle, MD [facility] Advanced Endoscopy Office # [phone]

## 2023-11-10 ENCOUNTER — Encounter: Payer: Self-pay | Admitting: Gastroenterology

## 2023-11-10 DIAGNOSIS — K3189 Other diseases of stomach and duodenum: Secondary | ICD-10-CM | POA: Insufficient documentation

## 2023-11-10 DIAGNOSIS — Z860101 Personal history of adenomatous and serrated colon polyps: Secondary | ICD-10-CM | POA: Insufficient documentation

## 2023-11-10 DIAGNOSIS — Z800 Family history of malignant neoplasm of digestive organs: Secondary | ICD-10-CM | POA: Insufficient documentation

## 2023-11-10 DIAGNOSIS — Z7901 Long term (current) use of anticoagulants: Secondary | ICD-10-CM | POA: Insufficient documentation

## 2023-11-10 DIAGNOSIS — Z1211 Encounter for screening for malignant neoplasm of colon: Secondary | ICD-10-CM | POA: Insufficient documentation

## 2023-11-19 DIAGNOSIS — I2510 Atherosclerotic heart disease of native coronary artery without angina pectoris: Secondary | ICD-10-CM | POA: Diagnosis not present

## 2023-11-19 DIAGNOSIS — Z79899 Other long term (current) drug therapy: Secondary | ICD-10-CM | POA: Diagnosis not present

## 2023-11-19 DIAGNOSIS — E785 Hyperlipidemia, unspecified: Secondary | ICD-10-CM | POA: Diagnosis not present

## 2023-11-19 DIAGNOSIS — I4819 Other persistent atrial fibrillation: Secondary | ICD-10-CM | POA: Diagnosis not present

## 2023-11-19 LAB — LIPID PANEL

## 2023-11-19 LAB — ALT: ALT: 18 IU/L (ref 0–44)

## 2023-11-20 ENCOUNTER — Ambulatory Visit (HOSPITAL_BASED_OUTPATIENT_CLINIC_OR_DEPARTMENT_OTHER): Payer: Self-pay | Admitting: Nurse Practitioner

## 2023-11-20 LAB — LIPID PANEL
Cholesterol, Total: 134 mg/dL (ref 100–199)
HDL: 66 mg/dL (ref >39)
LDL CALC COMMENT:: 2 ratio (ref 0.0–5.0)
LDL Chol Calc (NIH): 56 mg/dL (ref 0–99)
Triglycerides: 54 mg/dL (ref 0–149)
VLDL Cholesterol Cal: 12 mg/dL (ref 5–40)

## 2023-11-26 ENCOUNTER — Ambulatory Visit (HOSPITAL_COMMUNITY): Admit: 2023-11-26 | Admitting: Gastroenterology

## 2023-11-26 ENCOUNTER — Encounter (HOSPITAL_COMMUNITY): Payer: Self-pay

## 2023-11-26 SURGERY — ULTRASOUND, UPPER GI TRACT, ENDOSCOPIC
Anesthesia: Monitor Anesthesia Care

## 2023-12-10 DIAGNOSIS — H401123 Primary open-angle glaucoma, left eye, severe stage: Secondary | ICD-10-CM | POA: Diagnosis not present

## 2023-12-10 DIAGNOSIS — Z961 Presence of intraocular lens: Secondary | ICD-10-CM | POA: Diagnosis not present

## 2024-01-15 ENCOUNTER — Other Ambulatory Visit: Payer: Self-pay | Admitting: Cardiology

## 2024-01-15 NOTE — Telephone Encounter (Signed)
 Prescription refill request for Xarelto  received.  Indication: AF Last office visit: 10/09/23  C Fenton PA Weight: 97kg Age: 78 Scr: 1.00 on 10/09/23  Epic CrCl: 84.88  Based on above findings Xarelto  20mg  daily is the appropriate dose.  Refill approved.

## 2024-02-21 ENCOUNTER — Ambulatory Visit (HOSPITAL_BASED_OUTPATIENT_CLINIC_OR_DEPARTMENT_OTHER): Admitting: Nurse Practitioner

## 2024-02-21 ENCOUNTER — Encounter (HOSPITAL_BASED_OUTPATIENT_CLINIC_OR_DEPARTMENT_OTHER): Payer: Self-pay | Admitting: Nurse Practitioner

## 2024-02-21 VITALS — BP 114/68 | HR 55 | Ht 74.0 in | Wt 213.5 lb

## 2024-02-21 DIAGNOSIS — I2510 Atherosclerotic heart disease of native coronary artery without angina pectoris: Secondary | ICD-10-CM | POA: Diagnosis not present

## 2024-02-21 DIAGNOSIS — E7841 Elevated Lipoprotein(a): Secondary | ICD-10-CM | POA: Diagnosis not present

## 2024-02-21 DIAGNOSIS — D6869 Other thrombophilia: Secondary | ICD-10-CM

## 2024-02-21 DIAGNOSIS — E785 Hyperlipidemia, unspecified: Secondary | ICD-10-CM | POA: Diagnosis not present

## 2024-02-21 DIAGNOSIS — I4819 Other persistent atrial fibrillation: Secondary | ICD-10-CM

## 2024-02-21 DIAGNOSIS — Z79899 Other long term (current) drug therapy: Secondary | ICD-10-CM

## 2024-02-21 NOTE — Patient Instructions (Signed)
 Medication Instructions:   Your physician recommends that you continue on your current medications as directed. Please refer to the Current Medication list given to you today.   *If you need a refill on your cardiac medications before your next appointment, please call your pharmacy*  Lab Work:  None ordered.  If you have labs (blood work) drawn today and your tests are completely normal, you will receive your results only by: MyChart Message (if you have MyChart) OR A paper copy in the mail If you have any lab test that is abnormal or we need to change your treatment, we will call you to review the results.  Testing/Procedures:  None ordered.  Follow-Up: At [facility], you and your health needs are our priority.  As part of our continuing mission to provide you with exceptional heart care, our providers are all part of one team.  This team includes your primary Cardiologist (physician) and Advanced Practice Providers or APPs (Physician Assistants and Nurse Practitioners) who all work together to provide you with the care you need, when you need it.  Your next appointment:   1 year(s)  Provider:   Slater Duncan, NP    We recommend signing up for the patient portal called "MyChart".  Sign up information is provided on this After Visit Summary.  MyChart is used to connect with patients for Virtual Visits (Telemedicine).  Patients are able to view lab/test results, encounter notes, upcoming appointments, etc.  Non-urgent messages can be sent to your provider as well.   To learn more about what you can do with MyChart, go to [email or web address].   Other Instructions  Your physician wants you to follow-up in: 1 year.  You will receive a reminder letter in the mail two months in advance. If you don't receive a letter, please call our office to schedule the follow-up appointment.

## 2024-02-21 NOTE — Progress Notes (Signed)
 Cardiology Office Note:  .   Date:  02/21/2024  ID:  IBAN UTZ, DOB 1946-02-06, MRN [id number] PCP: Charlott Dorn LABOR, MD  [facility] Providers Cardiologist:  None Electrophysiologist:  Will Gladis Norton, MD    Patient Profile: .      PMH Persistent atrial fibrillation On Xarelto  for stroke prevention for CHA2DS2-VASc score of 4 On dofetilide  for rhythm control History of echocardiogram 03/2016 with normal LVEF, G1DD 04/2017 with mildly reduced LVEF 50-55% Coronary artery disease Normal ECG stress test 03/23/2016 Coronary CTA 01/16/2023 CAC score 104 (32nd percentile) Mild nonobstructive CAD Family history CAD   Referred to cardiology for evaluation of palpitations seen by Dr. Jeffrie in 02/2016.  History of normal nuclear stress test in 2005.  Normal GXT 03/23/2016, 2D echo with LVEF 55%, G1 DD.  24-hour monitor showed frequent PVCs at 700, occasional PACs, brief PAT and no atrial fibrillation.  He saw Dr. Delice 02/2017 for a wellness visit and screening EKG showed atrial fibrillation at 78 bpm.  He was started on Xarelto  20 mg daily and referred back to cardiology.  He has maintained consistent follow-up with cardiology, mainly followed by [facility].   Seen in clinic by me on 01/08/23 with his wife for evaluation of new onset shortness of breath and tightness in back of neck. Reported occasional nausea due to chronic headaches, but feels it may have worsened slightly recently. Symptoms started a few days prior while sitting in his recliner. Walked 4 miles recently without significant symptoms. Feels like his walking is slower than 3 months ago. Walks with a walking stick for balance. Can walk 8-9 miles when hunting. Occasional lightheadedness that he feels is 2/2 dehydration. Mild sleep apnea, no CPAP recommended. Family hx early CAD. Admits to frequent consumption of high fat diet. Echo and coronary CTA ordered due to symptoms.   TTE completed 01/09/23 revealed LVEF 55  to 60%, normal diastolic parameters, no RWMA, normal RV, mildly dilated LA, mild MR. Coronary CTA 01/16/23 revealed CAC score of 104, mild nonobstructive CAD.   Seen in follow-up by me on 02/26/23 with improvement in symptoms since just prior to CT. Worsening muscle and joint pain particularly in his neck and shoulders since starting rosuvastatin . He was having dysuria so urinalysis was obtained and revealed 1+ leukocytes. He was treated with Macrobid . Able to complete his usual activities at home including mowing grass and yard work, but when he attempts to do more than that, he feels weak. He attributes this to statin therapy because symptoms had improved, then worsened after starting rosuvastatin .  He denied chest pain, shortness of breath, lower extremity edema, fatigue, palpitations, melena, presyncope, syncope, orthopnea, and PND. Working on eating a better diet, including limiting saturated fat.   Seen by me on 08/28/23 for follow-up of CAD. Admits to weight gain over the holidays which he has subsequently lost and is back on track with his self-disciplined diet. Stopped Crestor  10 mg due to worsening muscle pain. He was splitting the pills for a while but stopped due to the bitterness and hassle of splitting the tablets. Willing to restart 5mg  dose. Asks about Watchman device due to concerns about blood thinners and the risk of falling in the future. Shortness of breath occurring when hunting due to the elevation in the land.  He walks 4 to 5 miles frequently and does not have any concerning cardiac symptoms. No chest pain, lower extremity edema, fatigue, palpitations, melena, presyncope, syncope, orthopnea, and PND.  History of Present Illness: .   Leonard Williams is a very pleasant 78 y.o. male here today for follow-up of CAD.  Discussed the use of AI scribe software for clinical note transcription with the patient, who gave verbal consent to proceed.  History of Present Illness Leonard Williams is a very pleasant 78 year old male who presents today for follow-up of coronary artery disease. He is feeling well and engages in regular physical activity, including gym workouts, neighborhood walks, and stair climbing, which he finds challenging but beneficial. He feels an occasional flutter that he attributes to premature contractions but feels that h has maintained sinus rhythm since being placed on Tikosyn  and does not have any concerning side effects. He occasionally experiences stiffness, which he attributes to age, but no chest pain or shortness of breath except during vigorous exercise. Diet is overall healthy, limiting red meat and butter. We discussed his elevated lipoprotein A at 195. Coronary CT 7/024 showed mixed plaque in the left anterior descending and right coronary arteries, and calcified plaque in the circumflex artery, placing him in the 32nd percentile for age. No symptoms concerning for angina; no lightheadedness, presyncope, syncope.   ROS: See HPI       Studies Reviewed: .         Risk Assessment/Calculations:    CHA2DS2-VASc Score = 4   This indicates a 4.8% annual risk of stroke. The patient's score is based upon: CHF History: 0 HTN History: 1 Diabetes History: 0 Stroke History: 0 Vascular Disease History: 1 Age Score: 2 Gender Score: 0            Physical Exam:   VS:  BP 114/68 (BP Location: Left Arm, Patient Position: Sitting, Cuff Size: Normal)   Pulse (!) 55   Ht 6' 2 (1.88 m)   Wt 213 lb 8 oz (96.8 kg)   SpO2 96%   BMI 27.41 kg/m    Wt Readings from Last 3 Encounters:  02/21/24 213 lb 8 oz (96.8 kg)  11/09/23 212 lb 4 oz (96.3 kg)  10/09/23 213 lb 12.8 oz (97 kg)    GEN: Well nourished, well developed in no acute distress NECK: No JVD; No carotid bruits CARDIAC: RRR, no murmurs, rubs, gallops RESPIRATORY:  Clear to auscultation without rales, wheezing or rhonchi  ABDOMEN: Soft, non-tender, non-distended EXTREMITIES:  No edema; No  deformity     ASSESSMENT AND PLAN: .     CAD without angina: Mild nonobstructive CAD on coronary CTA 01/16/2023. He remains very active and denies chest pain, dyspnea, or other symptoms concerning for angina. Notes shortness of breath with exercise that is appropriate for moderate intensity exercise; more with climbing stairs for exercise. No indication for further ischemic evaluation at this time. He is not on aspirin due to need for [facility]. LDL at goal of < 70 on labs completed 11/19/23 on rosuvastatin  5 mg daily. Consider PCSK9i given elevated LP(a) as noted below. BP is well controlled. Continue to focus on secondary prevention including heart healthy mostly plant based diet avoiding saturated fat, processed foods, simple carbohydrates, and sugar along with aiming for at least 150 minutes of moderate intensity exercise each week.   Persistent atrial fibrillation on chronic anticoagulation/High risk  medication use:  Clinically appears to be maintaining sinus rhythm with HR well controlled. No episodes of atrial fib since starting dofetilide  to his awareness. No bleeding concerns. Continue Xarelto  20 mg daily which is adequate dose for stroke prevention for CHA2DS2-VASc  score of 4.  He would like to discuss the possibility of watchman in the future. Encouraged him to keep his follow-up appointment with Daril Kicks, PA in [facility] for further discussion. Continue dofetilide.   Hyperlipidemia LDL goal < 70/Elevated LP(a): Lipid panel completed 11/19/23 with total cholesterol 134, triglycerides 54, HDL 66, LDL-C 56. Lipids are well controlled, however he has elevated lipoprotein(a) at 195.  He is willing to consider starting PCSK9i therapy. We will check coverage of these medications. Continue rosuvastatin 5 mg daily.        Disposition: 1 year with Dr. Jeffrie or me/Keep your appointment in October with [facility]  Signed, Rosaline Bane, NP-C

## 2024-02-22 ENCOUNTER — Encounter (HOSPITAL_BASED_OUTPATIENT_CLINIC_OR_DEPARTMENT_OTHER): Payer: Self-pay | Admitting: Nurse Practitioner

## 2024-02-22 ENCOUNTER — Telehealth: Payer: Self-pay | Admitting: Nurse Practitioner

## 2024-02-22 NOTE — Telephone Encounter (Signed)
 Pt has elevated LP(a) and mild CAD. Will you check to see which PCSK9i is preferred.  Thank you, Rosaline

## 2024-02-25 ENCOUNTER — Other Ambulatory Visit (HOSPITAL_COMMUNITY): Payer: Self-pay

## 2024-02-25 ENCOUNTER — Telehealth: Payer: Self-pay

## 2024-02-25 DIAGNOSIS — E7849 Other hyperlipidemia: Secondary | ICD-10-CM

## 2024-02-25 NOTE — Telephone Encounter (Signed)
 Pharmacy Patient Advocate Encounter  Received notification from [facility] that Prior Authorization for REPATHA  has been APPROVED from 02/25/24 to 02/24/25. Ran test claim, Copay is $43.95. This test claim was processed through [facility]- copay amounts may vary at other pharmacies due to pharmacy/plan contracts, or as the patient moves through the different stages of their insurance plan.

## 2024-02-25 NOTE — Telephone Encounter (Signed)
 Pharmacy Patient Advocate Encounter   Received notification from Physician's Office that prior authorization for REPATHA  is required/requested.   Insurance verification completed.   The patient is insured through [facility] .   Per test claim: PA required; PA submitted to above mentioned insurance via Latent Key/confirmation #/EOC [id number] Status is pending

## 2024-02-25 NOTE — Telephone Encounter (Signed)
 Repatha  PA request has been Submitted. New Encounter has been or will be created for follow up. For additional info see Pharmacy Prior Auth telephone encounter from 02/25/24.

## 2024-02-26 ENCOUNTER — Other Ambulatory Visit (HOSPITAL_BASED_OUTPATIENT_CLINIC_OR_DEPARTMENT_OTHER): Payer: Self-pay

## 2024-02-26 MED ORDER — REPATHA SURECLICK 140 MG/ML ~~LOC~~ SOAJ
140.0000 mg | SUBCUTANEOUS | 3 refills | Status: AC
  Filled 2024-02-26: qty 6, 84d supply, fill #0
  Filled 2024-04-08 – 2024-04-29 (×2): qty 6, 84d supply, fill #1
  Filled 2024-04-30 – 2024-08-02 (×3): qty 6, 84d supply, fill #2

## 2024-02-26 NOTE — Addendum Note (Signed)
 Addended by: Jadalyn Oliveri S on: 02/26/2024 01:51 PM   Modules accepted: Orders

## 2024-02-26 NOTE — Telephone Encounter (Signed)
 Please notify Leonard Williams that co-pay for Repatha  would be 43.95 at a [facility] pharmacy (it may vary slightly at a different pharmacy). He should continue rosuvastatin  5 mg daily. If he wants to pursue this therapy, we will recheck NMR and LP(a) in 2-3 months.

## 2024-03-17 DIAGNOSIS — L821 Other seborrheic keratosis: Secondary | ICD-10-CM | POA: Diagnosis not present

## 2024-03-17 DIAGNOSIS — L812 Freckles: Secondary | ICD-10-CM | POA: Diagnosis not present

## 2024-03-17 DIAGNOSIS — D225 Melanocytic nevi of trunk: Secondary | ICD-10-CM | POA: Diagnosis not present

## 2024-03-17 DIAGNOSIS — L820 Inflamed seborrheic keratosis: Secondary | ICD-10-CM | POA: Diagnosis not present

## 2024-03-17 DIAGNOSIS — L853 Xerosis cutis: Secondary | ICD-10-CM | POA: Diagnosis not present

## 2024-04-08 ENCOUNTER — Other Ambulatory Visit (HOSPITAL_BASED_OUTPATIENT_CLINIC_OR_DEPARTMENT_OTHER): Payer: Self-pay

## 2024-04-09 ENCOUNTER — Ambulatory Visit (HOSPITAL_COMMUNITY)
Admission: RE | Admit: 2024-04-09 | Discharge: 2024-04-09 | Disposition: A | Discharge status: Home / Self Care | Source: Ambulatory Visit | Attending: Physician Assistant | Admitting: Physician Assistant

## 2024-04-09 VITALS — BP 120/64 | HR 50 | Ht 74.0 in | Wt 215.0 lb

## 2024-04-09 DIAGNOSIS — I4819 Other persistent atrial fibrillation: Secondary | ICD-10-CM

## 2024-04-09 DIAGNOSIS — D6869 Other thrombophilia: Secondary | ICD-10-CM | POA: Diagnosis not present

## 2024-04-09 DIAGNOSIS — Z5181 Encounter for therapeutic drug level monitoring: Secondary | ICD-10-CM | POA: Diagnosis not present

## 2024-04-09 DIAGNOSIS — Z79899 Other long term (current) drug therapy: Secondary | ICD-10-CM | POA: Diagnosis not present

## 2024-04-09 DIAGNOSIS — I4891 Unspecified atrial fibrillation: Secondary | ICD-10-CM

## 2024-04-09 MED ORDER — DOFETILIDE 500 MCG PO CAPS: 500.0000 ug | ORAL_CAPSULE | Freq: Two times a day (BID) | ORAL | 2 refills | Status: AC

## 2024-04-09 NOTE — Progress Notes (Signed)
 Primary Care Physician: Leonard Williams LABOR, MD Primary Cardiologist: None Electrophysiologist: Leonard Gladis Norton, MD  Referring Physician: Dr Leonard Williams Leonard Williams is a 78 y.o. male with a history of CAD, HTN, HLD, atrial fibrillation who presents for follow up in the [facility]. This was found at the time of wellness exam in 2018. He has been maintained on dofetilide . Patient is on Xarelto  for stroke prevention.   Patient returns for follow up for atrial fibrillation and dofetilide  monitoring. He reports that he has done very well since his last visit. He has not had any interim symptoms of afib. No bleeding issues on anticoagulation.   Today, he  denies symptoms of palpitations, chest pain, shortness of breath, orthopnea, PND, lower extremity edema, dizziness, presyncope, syncope, snoring, daytime somnolence, bleeding, or neurologic sequela. The patient is tolerating medications without difficulties and is otherwise without complaint today.    Atrial Fibrillation Risk Factors:  he does not have symptoms or diagnosis of sleep apnea. he does not have a history of rheumatic fever.   Atrial Fibrillation Management history:  Previous antiarrhythmic drugs: dofetilide   Previous cardioversions: 2018 Previous ablations: none Anticoagulation history: Xarelto    ROS- All systems are reviewed and negative except as per the HPI above.  Past Medical History:  Diagnosis Date   Arthritis    mild arthritis- neck shoulders, more right shoulder-no problems at present.   Atrial fibrillation, persistent (HCC)    Cancer (HCC)    GI tumor- stomach tumor nonmalignant last check 1 yr ago.   Glaucoma    Headache disorder 03/08/2015   Topamax  taken daily-very low grade headache to none now.   History of colonic diverticulitis    left   History of hiatal hernia    omeprazole as needed- mild   [facility])     Current Outpatient Medications   Medication Sig Dispense Refill   acetaminophen (TYLENOL) 500 MG tablet Take 500-1,000 mg by mouth every 6 (six) hours as needed (PAIN). (Patient taking differently: Take 500-1,000 mg by mouth as needed (PAIN).)     cholecalciferol (VITAMIN D) 1000 units tablet Take 1,000 Units by mouth every evening.     dorzolamide -timolol  (COSOPT ) 22.3-6.8 MG/ML ophthalmic solution Place 1 drop into both eyes 2 (two) times daily.      Evolocumab  (REPATHA  SURECLICK) 140 MG/ML SOAJ Inject 140 mg into the skin every 14 (fourteen) days. 6 mL 3   latanoprost  (XALATAN ) 0.005 % ophthalmic solution Place 1 drop into both eyes at bedtime.      Multiple Vitamin (MULTIVITAMIN WITH MINERALS) TABS tablet Take 1 tablet by mouth every evening. CENTRUM SILVER FOR MEN 50+     nitroGLYCERIN  (NITROSTAT ) 0.4 MG SL tablet Place 1 tablet (0.4 mg total) under the tongue every 5 (five) minutes as needed for chest pain (DO NOT use within 48 hours of taking sildenafil). 25 tablet 3   rivaroxaban  (XARELTO ) 20 MG TABS tablet TAKE 1 TABLET BY MOUTH ONCE DAILY WITH SUPPER 90 tablet 1   rosuvastatin  (CRESTOR ) 5 MG tablet Take 1 tablet (5 mg total) by mouth daily. 90 tablet 3   sildenafil (REVATIO) 20 MG tablet Take 80-100 mg by mouth daily as needed (ERECTILE DYSFUNCTION).  8   tacrolimus (PROTOPIC) 0.1 % ointment Apply topically as needed.     dofetilide  (TIKOSYN ) 500 MCG capsule Take 1 capsule (500 mcg total) by mouth 2 (two) times daily. 180 capsule 2   No current facility-administered medications  for this encounter.    Physical Exam: BP 120/64   Pulse (!) 50   Ht 6' 2 (1.88 m)   Wt 97.5 kg   BMI 27.60 kg/m   GEN: Well nourished, well developed in no acute distress CARDIAC: Regular rate and rhythm, no murmurs, rubs, gallops RESPIRATORY:  Clear to auscultation without rales, wheezing or rhonchi  ABDOMEN: Soft, non-tender, non-distended EXTREMITIES:  No edema; No deformity    Wt Readings from Last 3 Encounters:  04/09/24  97.5 kg  02/21/24 96.8 kg  11/09/23 96.3 kg     EKG today demonstrates  SB Vent. rate 50 BPM PR interval 190 ms QRS duration 90 ms QT/QTcB 456/415 ms   Echo 01/09/23 demonstrated   1. Left ventricular ejection fraction, by estimation, is 55 to 60%. The  left ventricle has normal function. The left ventricle has no regional  wall motion abnormalities. Left ventricular diastolic parameters were  normal.   2. Right ventricular systolic function is normal. The right ventricular  size is normal. Tricuspid regurgitation signal is inadequate for assessing  PA pressure.   3. Left atrial size was mildly dilated.   4. The mitral valve is normal in structure. Mild mitral valve  regurgitation. No evidence of mitral stenosis.   5. The aortic valve is tricuspid. Aortic valve regurgitation is trivial.  No aortic stenosis is present.   6. The inferior vena cava is normal in size with greater than 50%  respiratory variability, suggesting right atrial pressure of 3 mmHg.     CHA2DS2-VASc Score = 4  The patient's score is based upon: CHF History: 0 HTN History: 1 Diabetes History: 0 Stroke History: 0 Vascular Disease History: 1 Age Score: 2 Gender Score: 0       ASSESSMENT AND PLAN: Persistent Atrial Fibrillation (ICD10:  I48.19) The patient's CHA2DS2-VASc score is 4, indicating a 4.8% annual risk of stroke.   Patient appears to be maintaining SR Continue dofetilide  500 mcg BID. Patient is considering afib ablation in order to get off AAD. Leonard have him follow up with Dr Inocencio to discuss.  Continue Xarelto  20 mg daily  Secondary Hypercoagulable State (ICD10:  D68.69) The patient is at significant risk for stroke/thromboembolism based upon his CHA2DS2-VASc Score of 4.  Continue Rivaroxaban  (Xarelto ). No bleeding issues.   High Risk Medication Monitoring (ICD 10: J342684) Patient requires ongoing monitoring for anti-arrhythmic medication which has the potential to cause life  threatening arrhythmias. QT interval on ECG acceptable for dofetilide  monitoring. Check bmet/mag today.     HTN Stable on current regimen  CAD No anginal symptoms Followed by Rosaline Katherleen Parchment    Follow up with Dr Inocencio in 6 months. AF clinic in one year.       Daril Kicks PA-C [facility] [address] [place], [place] [address] [phone]

## 2024-04-10 ENCOUNTER — Ambulatory Visit (HOSPITAL_COMMUNITY): Payer: Self-pay | Admitting: Physician Assistant

## 2024-04-10 LAB — BASIC METABOLIC PANEL WITH GFR
BUN/Creatinine Ratio: 14 (ref 10–24)
BUN: 14 mg/dL (ref 8–27)
CO2: 25 mmol/L (ref 20–29)
Calcium: 9.3 mg/dL (ref 8.6–10.2)
Chloride: 103 mmol/L (ref 96–106)
Creatinine, Ser: 1.01 mg/dL (ref 0.76–1.27)
Glucose: 105 mg/dL — ABNORMAL HIGH (ref 70–99)
Potassium: 5.1 mmol/L (ref 3.5–5.2)
Sodium: 140 mmol/L (ref 134–144)
eGFR: 77 mL/min/1.73 (ref >59)

## 2024-04-10 LAB — MAGNESIUM: Magnesium: 2.1 mg/dL (ref 1.6–2.3)

## 2024-04-14 DIAGNOSIS — Z961 Presence of intraocular lens: Secondary | ICD-10-CM | POA: Diagnosis not present

## 2024-04-14 DIAGNOSIS — H401123 Primary open-angle glaucoma, left eye, severe stage: Secondary | ICD-10-CM | POA: Diagnosis not present

## 2024-04-14 DIAGNOSIS — H04203 Unspecified epiphora, bilateral lacrimal glands: Secondary | ICD-10-CM | POA: Diagnosis not present

## 2024-04-28 ENCOUNTER — Other Ambulatory Visit (HOSPITAL_BASED_OUTPATIENT_CLINIC_OR_DEPARTMENT_OTHER): Payer: Self-pay

## 2024-04-29 ENCOUNTER — Other Ambulatory Visit (HOSPITAL_BASED_OUTPATIENT_CLINIC_OR_DEPARTMENT_OTHER): Payer: Self-pay

## 2024-04-29 DIAGNOSIS — E7849 Other hyperlipidemia: Secondary | ICD-10-CM | POA: Diagnosis not present

## 2024-04-30 ENCOUNTER — Other Ambulatory Visit (HOSPITAL_BASED_OUTPATIENT_CLINIC_OR_DEPARTMENT_OTHER): Payer: Self-pay

## 2024-04-30 LAB — NMR, LIPOPROFILE
Cholesterol, Total: 102 mg/dL (ref 100–199)
HDL Particle Number: 36.9 umol/L (ref >=30.5)
HDL-C: 66 mg/dL (ref >39)
LDL Particle Number: <300 nmol/L (ref <1000)
LDL-C (NIH Calc): 22 mg/dL (ref 0–99)
LP-IR Score: 30 (ref <=45)
Small LDL Particle Number: 116 nmol/L (ref <=527)
Triglycerides: 63 mg/dL (ref 0–149)

## 2024-04-30 LAB — LIPOPROTEIN A (LPA): Lipoprotein (a): 104 nmol/L — ABNORMAL HIGH (ref <75.0)

## 2024-05-01 ENCOUNTER — Ambulatory Visit: Payer: Self-pay | Admitting: Nurse Practitioner

## 2024-07-06 ENCOUNTER — Other Ambulatory Visit: Payer: Self-pay | Admitting: Nurse Practitioner

## 2024-07-22 ENCOUNTER — Other Ambulatory Visit (HOSPITAL_BASED_OUTPATIENT_CLINIC_OR_DEPARTMENT_OTHER): Payer: Self-pay

## 2024-08-03 ENCOUNTER — Other Ambulatory Visit (HOSPITAL_BASED_OUTPATIENT_CLINIC_OR_DEPARTMENT_OTHER): Payer: Self-pay
